# Patient Record
Sex: Female | Born: 1960 | Race: White | Hispanic: Yes | Marital: Married | Smoking: Never smoker
Health system: Southern US, Community
[De-identification: ages and names within clinical notes are randomized; demographics above are authoritative.]

## PROBLEM LIST (undated history)

## (undated) DIAGNOSIS — Z9189 Other specified personal risk factors, not elsewhere classified: Secondary | ICD-10-CM

## (undated) DIAGNOSIS — G43909 Migraine, unspecified, not intractable, without status migrainosus: Secondary | ICD-10-CM

## (undated) DIAGNOSIS — L709 Acne, unspecified: Secondary | ICD-10-CM

## (undated) DIAGNOSIS — R682 Dry mouth, unspecified: Secondary | ICD-10-CM

## (undated) DIAGNOSIS — N6009 Solitary cyst of unspecified breast: Secondary | ICD-10-CM

## (undated) DIAGNOSIS — H04123 Dry eye syndrome of bilateral lacrimal glands: Secondary | ICD-10-CM

## (undated) HISTORY — PX: BREAST SURGERY: SHX581

## (undated) HISTORY — PX: TONSILLECTOMY: SUR1361

## (undated) HISTORY — DX: Dry mouth, unspecified: R68.2

## (undated) HISTORY — PX: GYNECOLOGIC CRYOSURGERY: SHX857

## (undated) HISTORY — PX: APPENDECTOMY: SHX54

## (undated) HISTORY — DX: Migraine, unspecified, not intractable, without status migrainosus: G43.909

## (undated) HISTORY — DX: Solitary cyst of unspecified breast: N60.09

## (undated) HISTORY — DX: Other specified personal risk factors, not elsewhere classified: Z91.89

## (undated) HISTORY — PX: BREAST EXCISIONAL BIOPSY: SUR124

## (undated) HISTORY — DX: Dry eye syndrome of bilateral lacrimal glands: H04.123

## (undated) HISTORY — PX: COLPOSCOPY: SHX161

## (undated) HISTORY — DX: Acne, unspecified: L70.9

## (undated) HISTORY — PX: OVARIAN CYST SURGERY: SHX726

---

## 1997-04-15 ENCOUNTER — Ambulatory Visit (HOSPITAL_COMMUNITY): Admission: RE | Admit: 1997-04-15 | Discharge: 1997-04-15 | Payer: Self-pay | Admitting: Obstetrics and Gynecology

## 1997-11-20 ENCOUNTER — Encounter: Payer: Self-pay | Admitting: Family Medicine

## 1997-11-20 ENCOUNTER — Ambulatory Visit (HOSPITAL_COMMUNITY): Admission: RE | Admit: 1997-11-20 | Discharge: 1997-11-20 | Payer: Self-pay | Admitting: Family Medicine

## 1998-03-03 ENCOUNTER — Other Ambulatory Visit: Admission: RE | Admit: 1998-03-03 | Discharge: 1998-03-03 | Payer: Self-pay | Admitting: Obstetrics and Gynecology

## 1998-03-17 ENCOUNTER — Other Ambulatory Visit: Admission: RE | Admit: 1998-03-17 | Discharge: 1998-03-17 | Payer: Self-pay | Admitting: Obstetrics and Gynecology

## 1998-05-07 ENCOUNTER — Other Ambulatory Visit: Admission: RE | Admit: 1998-05-07 | Discharge: 1998-05-07 | Payer: Self-pay | Admitting: Obstetrics and Gynecology

## 1998-06-16 ENCOUNTER — Ambulatory Visit (HOSPITAL_COMMUNITY): Admission: RE | Admit: 1998-06-16 | Discharge: 1998-06-16 | Payer: Self-pay | Admitting: Family Medicine

## 1998-06-16 ENCOUNTER — Encounter: Payer: Self-pay | Admitting: Family Medicine

## 1998-08-03 ENCOUNTER — Ambulatory Visit (HOSPITAL_BASED_OUTPATIENT_CLINIC_OR_DEPARTMENT_OTHER): Admission: RE | Admit: 1998-08-03 | Discharge: 1998-08-03 | Payer: Self-pay | Admitting: *Deleted

## 1999-03-22 ENCOUNTER — Encounter: Payer: Self-pay | Admitting: Gastroenterology

## 1999-03-22 ENCOUNTER — Encounter: Admission: RE | Admit: 1999-03-22 | Discharge: 1999-03-22 | Payer: Self-pay | Admitting: Gastroenterology

## 1999-04-01 ENCOUNTER — Other Ambulatory Visit: Admission: RE | Admit: 1999-04-01 | Discharge: 1999-04-01 | Payer: Self-pay | Admitting: Family Medicine

## 1999-04-13 ENCOUNTER — Encounter: Admission: RE | Admit: 1999-04-13 | Discharge: 1999-04-13 | Payer: Self-pay | Admitting: Gastroenterology

## 1999-04-13 ENCOUNTER — Encounter: Payer: Self-pay | Admitting: Gastroenterology

## 1999-04-28 ENCOUNTER — Encounter: Admission: RE | Admit: 1999-04-28 | Discharge: 1999-04-28 | Payer: Self-pay | Admitting: Gastroenterology

## 1999-04-28 ENCOUNTER — Encounter: Payer: Self-pay | Admitting: Gastroenterology

## 1999-09-02 ENCOUNTER — Encounter: Payer: Self-pay | Admitting: Family Medicine

## 1999-09-02 ENCOUNTER — Ambulatory Visit (HOSPITAL_COMMUNITY): Admission: RE | Admit: 1999-09-02 | Discharge: 1999-09-02 | Payer: Self-pay | Admitting: Family Medicine

## 2000-07-20 ENCOUNTER — Other Ambulatory Visit: Admission: RE | Admit: 2000-07-20 | Discharge: 2000-07-20 | Payer: Self-pay | Admitting: *Deleted

## 2000-08-16 ENCOUNTER — Other Ambulatory Visit: Admission: RE | Admit: 2000-08-16 | Discharge: 2000-08-16 | Payer: Self-pay | Admitting: *Deleted

## 2000-08-17 ENCOUNTER — Other Ambulatory Visit: Admission: RE | Admit: 2000-08-17 | Discharge: 2000-08-17 | Payer: Self-pay | Admitting: *Deleted

## 2000-08-17 ENCOUNTER — Encounter (INDEPENDENT_AMBULATORY_CARE_PROVIDER_SITE_OTHER): Payer: Self-pay

## 2001-01-08 ENCOUNTER — Encounter: Admission: RE | Admit: 2001-01-08 | Discharge: 2001-01-08 | Payer: Self-pay | Admitting: *Deleted

## 2001-01-08 ENCOUNTER — Encounter: Payer: Self-pay | Admitting: *Deleted

## 2001-04-25 ENCOUNTER — Other Ambulatory Visit: Admission: RE | Admit: 2001-04-25 | Discharge: 2001-04-25 | Payer: Self-pay | Admitting: *Deleted

## 2002-01-22 ENCOUNTER — Encounter: Payer: Self-pay | Admitting: Obstetrics & Gynecology

## 2002-01-22 ENCOUNTER — Ambulatory Visit (HOSPITAL_COMMUNITY): Admission: RE | Admit: 2002-01-22 | Discharge: 2002-01-22 | Payer: Self-pay | Admitting: Obstetrics & Gynecology

## 2002-06-17 ENCOUNTER — Other Ambulatory Visit: Admission: RE | Admit: 2002-06-17 | Discharge: 2002-06-17 | Payer: Self-pay | Admitting: Obstetrics & Gynecology

## 2002-11-05 ENCOUNTER — Encounter: Payer: Self-pay | Admitting: Obstetrics & Gynecology

## 2002-11-05 ENCOUNTER — Encounter: Admission: RE | Admit: 2002-11-05 | Discharge: 2002-11-05 | Payer: Self-pay | Admitting: Obstetrics & Gynecology

## 2003-04-23 ENCOUNTER — Encounter: Admission: RE | Admit: 2003-04-23 | Discharge: 2003-04-23 | Payer: Self-pay | Admitting: Family Medicine

## 2003-09-18 ENCOUNTER — Encounter: Admission: RE | Admit: 2003-09-18 | Discharge: 2003-09-18 | Payer: Self-pay | Admitting: Family Medicine

## 2004-07-01 ENCOUNTER — Encounter: Admission: RE | Admit: 2004-07-01 | Discharge: 2004-07-01 | Payer: Self-pay | Admitting: Gastroenterology

## 2006-05-11 ENCOUNTER — Ambulatory Visit: Payer: Self-pay | Admitting: Family Medicine

## 2006-05-11 LAB — CONVERTED CEMR LAB
AST: 20 units/L (ref 0–37)
Alkaline Phosphatase: 59 units/L (ref 39–117)
BUN: 11 mg/dL (ref 6–23)
Basophils Absolute: 0 10*3/uL (ref 0.0–0.1)
Basophils Relative: 0 % (ref 0.0–1.0)
CO2: 31 meq/L (ref 19–32)
Calcium: 9.5 mg/dL (ref 8.4–10.5)
Creatinine, Ser: 0.7 mg/dL (ref 0.4–1.2)
GFR calc non Af Amer: 96 mL/min
Lymphocytes Relative: 38.4 % (ref 12.0–46.0)
MCHC: 34.7 g/dL (ref 30.0–36.0)
Monocytes Absolute: 0.7 10*3/uL (ref 0.2–0.7)
Monocytes Relative: 11.1 % — ABNORMAL HIGH (ref 3.0–11.0)
Neutro Abs: 2.9 10*3/uL (ref 1.4–7.7)
Neutrophils Relative %: 46.3 % (ref 43.0–77.0)
Platelets: 254 10*3/uL (ref 150–400)
Potassium: 4.8 meq/L (ref 3.5–5.1)
RDW: 12.7 % (ref 11.5–14.6)
TSH: 1.21 microintl units/mL (ref 0.35–5.50)
Total CHOL/HDL Ratio: 3.8
Triglycerides: 105 mg/dL (ref 0–149)

## 2006-05-18 ENCOUNTER — Ambulatory Visit: Payer: Self-pay | Admitting: Family Medicine

## 2006-05-24 ENCOUNTER — Ambulatory Visit: Payer: Self-pay | Admitting: Family Medicine

## 2006-06-14 ENCOUNTER — Ambulatory Visit: Payer: Self-pay | Admitting: Family Medicine

## 2007-01-10 ENCOUNTER — Encounter: Admission: RE | Admit: 2007-01-10 | Discharge: 2007-01-10 | Payer: Self-pay | Admitting: Obstetrics & Gynecology

## 2007-03-16 ENCOUNTER — Ambulatory Visit: Payer: Self-pay | Admitting: Family Medicine

## 2007-03-16 ENCOUNTER — Telehealth: Payer: Self-pay | Admitting: Family Medicine

## 2007-03-29 ENCOUNTER — Telehealth: Payer: Self-pay | Admitting: Family Medicine

## 2007-06-22 ENCOUNTER — Other Ambulatory Visit: Admission: RE | Admit: 2007-06-22 | Discharge: 2007-06-22 | Payer: Self-pay | Admitting: Obstetrics and Gynecology

## 2007-06-27 ENCOUNTER — Ambulatory Visit: Payer: Self-pay | Admitting: Family Medicine

## 2007-06-27 LAB — CONVERTED CEMR LAB
BUN: 9 mg/dL (ref 6–23)
CO2: 26 meq/L (ref 19–32)
Chloride: 107 meq/L (ref 96–112)
Cholesterol: 173 mg/dL (ref 0–200)
Eosinophils Absolute: 0.3 10*3/uL (ref 0.0–0.7)
Eosinophils Relative: 5.2 % — ABNORMAL HIGH (ref 0.0–5.0)
HCT: 39.5 % (ref 36.0–46.0)
HDL: 36.6 mg/dL — ABNORMAL LOW (ref >39.0)
Lymphocytes Relative: 29.2 % (ref 12.0–46.0)
MCHC: 34.4 g/dL (ref 30.0–36.0)
Monocytes Absolute: 0.8 10*3/uL (ref 0.1–1.0)
Potassium: 4.1 meq/L (ref 3.5–5.1)
RDW: 12.5 % (ref 11.5–14.6)
Sodium: 139 meq/L (ref 135–145)
Total CHOL/HDL Ratio: 4.7
Total Protein: 6.8 g/dL (ref 6.0–8.3)
Triglycerides: 98 mg/dL (ref 0–149)

## 2007-07-05 ENCOUNTER — Ambulatory Visit: Payer: Self-pay | Admitting: Family Medicine

## 2007-07-05 DIAGNOSIS — N951 Menopausal and female climacteric states: Secondary | ICD-10-CM

## 2007-07-05 DIAGNOSIS — N63 Unspecified lump in unspecified breast: Secondary | ICD-10-CM

## 2007-07-05 DIAGNOSIS — G43909 Migraine, unspecified, not intractable, without status migrainosus: Secondary | ICD-10-CM

## 2007-07-09 ENCOUNTER — Telehealth: Payer: Self-pay | Admitting: Family Medicine

## 2007-07-16 ENCOUNTER — Telehealth: Payer: Self-pay | Admitting: Family Medicine

## 2007-07-17 ENCOUNTER — Ambulatory Visit: Payer: Self-pay | Admitting: Family Medicine

## 2007-07-17 DIAGNOSIS — R198 Other specified symptoms and signs involving the digestive system and abdomen: Secondary | ICD-10-CM

## 2007-07-21 DIAGNOSIS — M1389 Other specified arthritis, multiple sites: Secondary | ICD-10-CM | POA: Insufficient documentation

## 2007-07-22 ENCOUNTER — Telehealth: Payer: Self-pay | Admitting: Internal Medicine

## 2007-07-24 ENCOUNTER — Ambulatory Visit: Payer: Self-pay | Admitting: Family Medicine

## 2007-07-25 ENCOUNTER — Telehealth: Payer: Self-pay | Admitting: Family Medicine

## 2007-08-22 ENCOUNTER — Telehealth: Payer: Self-pay | Admitting: Family Medicine

## 2007-11-04 DIAGNOSIS — J069 Acute upper respiratory infection, unspecified: Secondary | ICD-10-CM | POA: Insufficient documentation

## 2007-11-06 ENCOUNTER — Ambulatory Visit: Payer: Self-pay | Admitting: Family Medicine

## 2008-02-04 ENCOUNTER — Encounter: Admission: RE | Admit: 2008-02-04 | Discharge: 2008-02-04 | Payer: Self-pay | Admitting: Family Medicine

## 2008-02-06 ENCOUNTER — Telehealth: Payer: Self-pay | Admitting: *Deleted

## 2008-02-06 ENCOUNTER — Ambulatory Visit: Payer: Self-pay | Admitting: Family Medicine

## 2008-02-06 DIAGNOSIS — J019 Acute sinusitis, unspecified: Secondary | ICD-10-CM

## 2008-03-04 ENCOUNTER — Telehealth: Payer: Self-pay | Admitting: *Deleted

## 2008-04-04 ENCOUNTER — Ambulatory Visit: Payer: Self-pay | Admitting: Internal Medicine

## 2008-06-10 ENCOUNTER — Encounter: Payer: Self-pay | Admitting: Family Medicine

## 2008-06-10 ENCOUNTER — Ambulatory Visit: Payer: Self-pay | Admitting: Family Medicine

## 2008-06-26 ENCOUNTER — Ambulatory Visit: Payer: Self-pay | Admitting: Family Medicine

## 2008-06-26 LAB — CONVERTED CEMR LAB
ALT: 18 units/L (ref 0–35)
Basophils Relative: 0 % (ref 0.0–3.0)
Bilirubin Urine: NEGATIVE
Bilirubin, Direct: 0.1 mg/dL (ref 0.0–0.3)
CO2: 28 meq/L (ref 19–32)
Calcium: 8.6 mg/dL (ref 8.4–10.5)
Creatinine, Ser: 0.8 mg/dL (ref 0.4–1.2)
GFR calc non Af Amer: 81.65 mL/min (ref >60)
HCT: 37.3 % (ref 36.0–46.0)
HDL: 42.4 mg/dL (ref >39.00)
Hemoglobin: 13 g/dL (ref 12.0–15.0)
MCV: 93.9 fL (ref 78.0–100.0)
Monocytes Absolute: 0.7 10*3/uL (ref 0.1–1.0)
Monocytes Relative: 11.1 % (ref 3.0–12.0)
Neutrophils Relative %: 51.6 % (ref 43.0–77.0)
Nitrite: NEGATIVE
Platelets: 204 10*3/uL (ref 150.0–400.0)
Potassium: 4.1 meq/L (ref 3.5–5.1)
Protein, U semiquant: NEGATIVE
RBC: 3.97 M/uL (ref 3.87–5.11)
Sodium: 139 meq/L (ref 135–145)
Urobilinogen, UA: 0.2
WBC Urine, dipstick: NEGATIVE
WBC: 6.1 10*3/uL (ref 4.5–10.5)
pH: 5.5

## 2008-07-15 ENCOUNTER — Ambulatory Visit: Payer: Self-pay | Admitting: Family Medicine

## 2008-07-16 ENCOUNTER — Other Ambulatory Visit: Admission: RE | Admit: 2008-07-16 | Discharge: 2008-07-16 | Payer: Self-pay | Admitting: Obstetrics and Gynecology

## 2008-07-16 ENCOUNTER — Encounter: Payer: Self-pay | Admitting: Obstetrics and Gynecology

## 2008-07-16 ENCOUNTER — Ambulatory Visit: Payer: Self-pay | Admitting: Obstetrics and Gynecology

## 2008-07-21 ENCOUNTER — Telehealth: Payer: Self-pay | Admitting: Family Medicine

## 2008-12-03 ENCOUNTER — Ambulatory Visit: Payer: Self-pay | Admitting: Gynecology

## 2008-12-10 DIAGNOSIS — M542 Cervicalgia: Secondary | ICD-10-CM

## 2008-12-17 ENCOUNTER — Ambulatory Visit: Payer: Self-pay | Admitting: Family Medicine

## 2008-12-17 ENCOUNTER — Telehealth: Payer: Self-pay | Admitting: Family Medicine

## 2008-12-17 DIAGNOSIS — R1011 Right upper quadrant pain: Secondary | ICD-10-CM | POA: Insufficient documentation

## 2008-12-17 DIAGNOSIS — M25519 Pain in unspecified shoulder: Secondary | ICD-10-CM

## 2008-12-17 DIAGNOSIS — Z8719 Personal history of other diseases of the digestive system: Secondary | ICD-10-CM

## 2008-12-18 ENCOUNTER — Telehealth: Payer: Self-pay | Admitting: Family Medicine

## 2008-12-18 ENCOUNTER — Encounter: Admission: RE | Admit: 2008-12-18 | Discharge: 2008-12-18 | Payer: Self-pay | Admitting: Family Medicine

## 2008-12-22 ENCOUNTER — Telehealth: Payer: Self-pay | Admitting: Family Medicine

## 2008-12-23 ENCOUNTER — Encounter: Admission: RE | Admit: 2008-12-23 | Discharge: 2008-12-23 | Payer: Self-pay | Admitting: Family Medicine

## 2009-06-26 ENCOUNTER — Ambulatory Visit: Payer: Self-pay | Admitting: Family Medicine

## 2009-06-26 DIAGNOSIS — M545 Low back pain, unspecified: Secondary | ICD-10-CM | POA: Insufficient documentation

## 2009-06-26 DIAGNOSIS — D179 Benign lipomatous neoplasm, unspecified: Secondary | ICD-10-CM | POA: Insufficient documentation

## 2009-06-30 ENCOUNTER — Ambulatory Visit: Payer: Self-pay | Admitting: Family Medicine

## 2009-06-30 LAB — CONVERTED CEMR LAB
ALT: 17 units/L (ref 0–35)
AST: 23 units/L (ref 0–37)
Alkaline Phosphatase: 39 units/L (ref 39–117)
Bilirubin, Direct: 0.1 mg/dL (ref 0.0–0.3)
Blood in Urine, dipstick: NEGATIVE
Calcium: 8.9 mg/dL (ref 8.4–10.5)
Cholesterol: 192 mg/dL (ref 0–200)
Eosinophils Absolute: 0.3 10*3/uL (ref 0.0–0.7)
Eosinophils Relative: 5.7 % — ABNORMAL HIGH (ref 0.0–5.0)
GFR calc non Af Amer: 101.5 mL/min (ref >60)
Glucose, Bld: 91 mg/dL (ref 70–99)
Hemoglobin: 13 g/dL (ref 12.0–15.0)
Ketones, urine, test strip: NEGATIVE
Lymphocytes Relative: 34.3 % (ref 12.0–46.0)
MCHC: 34.5 g/dL (ref 30.0–36.0)
MCV: 95 fL (ref 78.0–100.0)
Monocytes Absolute: 0.6 10*3/uL (ref 0.1–1.0)
Neutrophils Relative %: 48 % (ref 43.0–77.0)
Potassium: 3.9 meq/L (ref 3.5–5.1)
TSH: 0.76 microintl units/mL (ref 0.35–5.50)
Total Bilirubin: 0.8 mg/dL (ref 0.3–1.2)
VLDL: 19 mg/dL (ref 0.0–40.0)
pH: 8.5

## 2009-07-16 ENCOUNTER — Ambulatory Visit: Payer: Self-pay | Admitting: Family Medicine

## 2009-07-17 ENCOUNTER — Other Ambulatory Visit: Admission: RE | Admit: 2009-07-17 | Discharge: 2009-07-17 | Payer: Self-pay | Admitting: Obstetrics and Gynecology

## 2009-07-17 ENCOUNTER — Ambulatory Visit: Payer: Self-pay | Admitting: Obstetrics and Gynecology

## 2009-08-18 ENCOUNTER — Ambulatory Visit: Payer: Self-pay | Admitting: Obstetrics and Gynecology

## 2009-09-07 ENCOUNTER — Ambulatory Visit: Payer: Self-pay | Admitting: Family Medicine

## 2009-09-07 DIAGNOSIS — J309 Allergic rhinitis, unspecified: Secondary | ICD-10-CM

## 2009-09-10 ENCOUNTER — Encounter: Admission: RE | Admit: 2009-09-10 | Discharge: 2009-09-10 | Payer: Self-pay | Admitting: Family Medicine

## 2009-11-19 ENCOUNTER — Ambulatory Visit: Payer: Self-pay | Admitting: Family Medicine

## 2009-11-24 ENCOUNTER — Telehealth: Payer: Self-pay | Admitting: Family Medicine

## 2009-11-26 ENCOUNTER — Ambulatory Visit: Payer: Self-pay | Admitting: Family Medicine

## 2009-11-27 ENCOUNTER — Telehealth: Payer: Self-pay | Admitting: Family Medicine

## 2009-11-27 ENCOUNTER — Ambulatory Visit: Payer: Self-pay | Admitting: Family Medicine

## 2009-11-30 ENCOUNTER — Ambulatory Visit: Payer: Self-pay | Admitting: Family Medicine

## 2010-01-15 ENCOUNTER — Ambulatory Visit: Payer: Self-pay | Admitting: Gynecology

## 2010-02-15 ENCOUNTER — Ambulatory Visit
Admission: RE | Admit: 2010-02-15 | Discharge: 2010-02-15 | Payer: Self-pay | Source: Home / Self Care | Attending: Obstetrics and Gynecology | Admitting: Obstetrics and Gynecology

## 2010-03-01 ENCOUNTER — Other Ambulatory Visit: Payer: Self-pay | Admitting: Obstetrics and Gynecology

## 2010-03-01 ENCOUNTER — Ambulatory Visit
Admission: RE | Admit: 2010-03-01 | Discharge: 2010-03-01 | Payer: Self-pay | Source: Home / Self Care | Attending: Obstetrics and Gynecology | Admitting: Obstetrics and Gynecology

## 2010-03-01 DIAGNOSIS — N6320 Unspecified lump in the left breast, unspecified quadrant: Secondary | ICD-10-CM

## 2010-03-02 NOTE — Progress Notes (Signed)
Summary: Call A Nurse   Call-A-Nurse Triage Call Report Triage Record Num: [id number] Operator: Frederico Hamman Patient Name: Regina Mendez Call Date & Time: 11/26/2009 6:07:28PM Patient Phone: [phone] PCP: Tinnie Gens A. Todd Patient Gender: Female PCP Fax : [phone] Patient DOB: 1960/12/12 Practice Name: [facility] Reason for Call: [name] states she was seen by Dr Tawanna Cooler 10/27 and ordered a nebulizer and albuterol. Nebulizer delivered by a Home [facility].Needs prescription called in to [facility] for Albuterol. Dr. Oliver Barre notified. Verbal order given for Albuterol .083% -usual strength-use as directed. Dispense 1 box with no refills. Called in to [facility]-[phone]. Care advice given. Protocol(s) Used: Medication Question Calls, No Triage (Adults) Recommended Outcome per Protocol: Call Darshan Solanki Immediately Reason for Outcome: [1] Urgent new prescription or refill (likelihood of harm to patient if not taken) AND [2] triager unable to fill per unit policy Care Advice:  ~ 10/

## 2010-03-02 NOTE — Assessment & Plan Note (Signed)
Summary: ST/NJR   Vital Signs:  Patient profile:   50 year old female Temp:     98.4 degrees F oral BP sitting:   90 / 64  (left arm) Cuff size:   regular  Vitals Entered By: Kern Reap CMA Duncan Dull) (September 07, 2009 4:14 PM) CC: sore throat, left breast cysts Is Patient Diabetic? No   CC:  sore throat and left breast cysts.  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  Also she's had a history of cystic lesions in both breasts.  However, the one in her left breast is more tender.  She noticed this about a week after they increased her estrogen from .0375 to .5.  This was done by her GYN.  Allergies: 1)  ! Codeine 2)  ! Sulfa 3)  ! * Ciprofloxacin  Past History:  Past medical, surgical, family and social histories (including risk factors) reviewed for relevance to current acute and chronic problems.  Past Medical History: Reviewed history from 07/05/2007 and no changes required. Acne MHA PMS childbirth plans to 50 years of age appendectomy right breast biopsy premature menopause  Past Surgical History: Reviewed history from 09/20/2006 and no changes required. CB x2 Breast Biopsy Appendectomy Colonoscopy Ovarian Cysts Tonsillectomy  Family History: Reviewed history from 09/20/2006 and no changes required. Family History of Arthritis Family History of CAD Female 1st degree relative <60 Family History Diabetes 1st degree relative Family History High cholesterol Family History Hypertension Family History Thyroid disease  Social History: Reviewed history from 09/20/2006 and no changes required. Occupation: professor Married Never Smoked Alcohol use-yes Drug use-no Regular exercise-no  Review of Systems      See HPI  Physical Exam  General:  Well-developed,well-nourished,in no acute distress; alert,appropriate and cooperative throughout  examination Head:  Normocephalic and atraumatic without obvious abnormalities. No apparent alopecia or balding. Eyes:  No corneal or conjunctival inflammation noted. EOMI. Perrla. Funduscopic exam benign, without hemorrhages, exudates or papilledema. Vision grossly normal. Ears:  External ear exam shows no significant lesions or deformities.  Otoscopic examination reveals clear canals, tympanic membranes are intact bilaterally without bulging, retraction, inflammation or discharge. Hearing is grossly normal bilaterally. Nose:  External nasal examination shows no deformity or inflammation. Nasal mucosa are pink and moist without lesions or exudates. Mouth:  Oral mucosa and oropharynx without lesions or exudates.  Teeth in good repair. Neck:  No deformities, masses, or tenderness noted. Chest Wall:  No deformities, masses, or tenderness noted. Breasts:  there is a Robin egg -sized lesion in her right breast at 3 o'clock image from the nipple left breast shows a large lesion at the 3 o'clock position about 2 inches.  The nipple.  The size of a handball also medial to that.  There is a Robin egg size lesion also. Lungs:  Normal respiratory effort, chest expands symmetrically. Lungs are clear to auscultation, no crackles or wheezes.   Impression & Recommendations:  Problem # 1:  LUMP OR MASS IN BREAST (ICD-611.72) Assessment Deteriorated  Problem # 2:  ALLERGIC ARTHRITIS, MULTIPLE SITES (ICD-716.29) Assessment: Deteriorated  Complete Medication List: 1)  Adult Aspirin Low Strength 81 Mg Tbdp (Aspirin) 2)  Ambien 5 Mg Tabs (Zolpidem tartrate) .... One tab at bedtime as needed 3)  Estrogen Patch 0.5  .... Apply once weekly 4)  Prometrium 200 Mg Caps (Progesterone micronized) .... Take one tab the first 12 days of the  month 5)  Xanax 0.5 Mg Tabs (Alprazolam) .... One by mouth 1 hour before flying. 6)  Clindamycin Phosphate 1 % Soln (Clindamycin phosphate) .... Apply once daily to affected  area  Other Orders: Radiology Referral (Radiology)  Patient Instructions: 1)  we will call and get to set up for an ultrasound of the left breast. 2)  Also take 10 mg of plain Zyrtec at bedtime for the congestion, postnasal drip, and sore throat. Prescriptions: CLINDAMYCIN PHOSPHATE 1 % SOLN (CLINDAMYCIN PHOSPHATE) apply once daily to affected area  #60 cc x 11   Entered and Authorized by:   Roderick Pee MD   Signed by:   Roderick Pee MD on 09/07/2009   Method used:   Print then Give to Patient   RxID:   [id number]

## 2010-03-02 NOTE — Assessment & Plan Note (Signed)
Summary: chest congestion/cjr/pt rsc/cjr   Vital Signs:  Patient profile:   50 year old female Weight:      126 pounds Temp:     98.8 degrees F oral BP sitting:   98 / 72  (left arm) Cuff size:   regular  Vitals Entered By: Kern Reap CMA Duncan Dull) (November 26, 2009 12:30 PM) CC: follow-up visit   CC:  follow-up visit.  History of Present Illness: Regina Mendez is a 50 year old, married female, nonsmoker, who comes in today for follow-up of asthma.  We saw her on October the 20th at that time.  She had a viral infection with some mild wheezing.  We put her on prednisone two tabs x 3 days with a taper lots of liquids and cough syrup.  She stopped the prednisone because it made her hyper.  Over the last 48 hours.  The wheezing is gotten worse.  She took 2 teaspoons of her daughters, albuterol elixir last night.  She's had no fever, sputum production, et Karie Soda.  Allergies: 1)  ! Codeine 2)  ! Sulfa 3)  ! * Ciprofloxacin  Past History:  Past medical, surgical, family and social histories (including risk factors) reviewed for relevance to current acute and chronic problems.  Past Medical History: Reviewed history from 07/05/2007 and no changes required. Acne MHA PMS childbirth plans to 50 years of age appendectomy right breast biopsy premature menopause  Past Surgical History: Reviewed history from 09/20/2006 and no changes required. CB x2 Breast Biopsy Appendectomy Colonoscopy Ovarian Cysts Tonsillectomy  Family History: Reviewed history from 09/20/2006 and no changes required. Family History of Arthritis Family History of CAD Female 1st degree relative <60 Family History Diabetes 1st degree relative Family History High cholesterol Family History Hypertension Family History Thyroid disease  Social History: Reviewed history from 09/20/2006 and no changes required. 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 No apparent alopecia or balding. Eyes:  No corneal or conjunctival inflammation noted. EOMI. Perrla. Funduscopic exam benign, without hemorrhages, exudates or papilledema. Vision grossly normal. Ears:  External ear exam shows no significant lesions or deformities.  Otoscopic examination reveals clear canals, tympanic membranes are intact bilaterally without bulging, retraction, inflammation or discharge. Hearing is grossly normal bilaterally. Nose:  External nasal examination shows no deformity or inflammation. Nasal mucosa are pink and moist without lesions or exudates. Mouth:  Oral mucosa and oropharynx without lesions or exudates.  Teeth in good repair. Neck:  No deformities, masses, or tenderness noted. Lungs:  inspiratory and expiratory wheezing   Problems:  Medical Problems Added: 1)  Dx of Asthma  (ICD-493.90)  Impression & Recommendations:  Problem # 1:  ASTHMA (ICD-493.90) Assessment Deteriorated  Her updated medication list for this problem includes:    Prednisone 20 Mg Tabs (Prednisone) ..... Uad    Qvar 40 Mcg/act Aers (Beclomethasone dipropionate) .Marland Kitchen... Take 2 puffs two times a day    Albuterol Sulfate (2.5 Mg/64ml) 0.083% Nebu (Albuterol sulfate) ..... Use every 6 hours as needed  Her updated medication list for this problem includes:    Prednisone 20 Mg Tabs (Prednisone) ..... Uad    Qvar 40 Mcg/act Aers (Beclomethasone dipropionate) .Marland Kitchen... Take 2 puffs two times a day    Albuterol Sulfate (2.5 Mg/1ml) 0.083% Nebu (Albuterol sulfate) ..... Use every 6  hours as needed  Orders: Nebulizer Tx (95621)  Complete Medication List: 1)  Adult Aspirin Low Strength 81 Mg Tbdp (Aspirin) 2)  Ambien 5 Mg Tabs (Zolpidem tartrate) ....  One tab at bedtime as needed 3)  Estrogen Patch 0.5  .... Apply once weekly 4)  Prometrium 200 Mg Caps (Progesterone micronized) .... Take one tab the first 12 days of the month 5)  Xanax 0.5 Mg Tabs (Alprazolam) .... One by mouth 1 hour before flying. 6)  Clindamycin Phosphate 1 % Soln (Clindamycin phosphate) .... Apply once daily to affected area 7)  Prednisone 20 Mg Tabs (Prednisone) .... Uad 8)  Hydromet 5-1.5 Mg/6ml Syrp (Hydrocodone-homatropine) .... 1/2 to 1 tsp three times a day as needed cough 9)  Qvar 40 Mcg/act Aers (Beclomethasone dipropionate) .... Take 2 puffs two times a day 10)  Albuterol Sulfate (2.5 Mg/39ml) 0.083% Nebu (Albuterol sulfate) .... Use every 6 hours as needed 11)  Doxycycline Hyclate 100 Mg Caps (Doxycycline hyclate) .... Take 1 tablet by mouth two times a day  Other Orders: Durable Medical Equipment (DME)  Patient Instructions: 1)  I want you to rest at home, and if  on follow-up Monday, you are  much better,we  will consider letting u  go back to work on Tuesday November the first. 2)  Drink lots of water. 3)  Prednisone two tabs daily in the morning. 4)  Qvar 2+ twice daily. 5)  The nebulizer with albuterol 4 times daily. 6)  Hydromet 0.5-teaspoon 4 times a day. 7)  Return tomorrow at 430 for follow-up. 8)  Doxycycline 100 mg b.i.d. x 10 days Prescriptions: DOXYCYCLINE HYCLATE 100 MG CAPS (DOXYCYCLINE HYCLATE) Take 1 tablet by mouth two times a day  #20 x 1   Entered and Authorized by:   Roderick Pee MD   Signed by:   Roderick Pee MD on 11/26/2009   Method used:   Electronically to        [facility]* (retail)       [address]       [place], [place]  [id number]       Ph: [phone]       Fax: [phone]   RxID:   [id number] DOXYCYCLINE HYCLATE 100 MG CAPS (DOXYCYCLINE HYCLATE) Take 1 tablet by mouth two times a day  #20 x 1   Entered and Authorized by:   Roderick Pee MD   Signed by:   Roderick Pee MD on  11/26/2009   Method used:   Print then Give to Patient   RxID:   [id number] ALBUTEROL SULFATE (2.5 MG/3ML) 0.083% NEBU (ALBUTEROL SULFATE) use every 6 hours as needed  #3 x 6   Entered by:   Kern Reap CMA (AAMA)   Authorized by:   Roderick Pee MD   Signed by:   Kern Reap CMA (AAMA) on 11/26/2009   Method used:   Print then Give to Patient   RxID:   [id number]    Orders Added: 1)  Durable Medical Equipment [DME] 2)  Est. Patient Level IV [73220] 3)  Nebulizer Tx [[id number]]

## 2010-03-02 NOTE — Assessment & Plan Note (Signed)
Summary: FLU-LIKE SXS // RS   Vital Signs:  Patient profile:   50 year old female Weight:      126 pounds Temp:     98.2 degrees F oral BP sitting:   102 / 68  (left arm) Cuff size:   regular  Vitals Entered By: Kern Reap CMA Duncan Dull) (November 19, 2009 11:18 AM) CC: body aches, HA, chest congestion   CC:  body aches, HA, and chest congestion.  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  She developed a viral syndrome over the weekend with head congestion, runny nose.  No fever.  Yesterday, she began having tightness in her chest with wheezing.  She said a history of allergic rhinitis and asthma in the past.  She did sleep well last night did not wake up short of breath  Allergies: 1)  ! Codeine 2)  ! Sulfa 3)  ! * Ciprofloxacin  Past History:  Past medical, surgical, family and social histories (including risk factors) reviewed, and no changes noted (except as noted below).  Past Medical History: Reviewed history from 07/05/2007 and no changes required. Acne MHA PMS childbirth plans to 50 years of age appendectomy right breast biopsy premature menopause  Past Surgical History: Reviewed history from 09/20/2006 and no changes required. CB x2 Breast Biopsy Appendectomy Colonoscopy Ovarian Cysts Tonsillectomy  Family History: Reviewed history from 09/20/2006 and no changes required. Family History of Arthritis Family History of CAD Female 1st degree relative <60 Family History Diabetes 1st degree relative Family History High cholesterol Family History Hypertension Family History Thyroid disease  Social History: Reviewed history from 09/20/2006 and no changes required. 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 No apparent alopecia or balding. Eyes:  No corneal or conjunctival inflammation noted. EOMI. Perrla. Funduscopic exam benign, without hemorrhages, exudates or papilledema. Vision grossly normal. Ears:  External ear exam shows no significant lesions or deformities.  Otoscopic examination reveals clear canals, tympanic membranes are intact bilaterally without bulging, retraction, inflammation or discharge. Hearing is grossly normal bilaterally. Nose:  External nasal examination shows no deformity or inflammation. Nasal mucosa are pink and moist without lesions or exudates. Mouth:  Oral mucosa and oropharynx without lesions or exudates.  Teeth in good repair. Neck:  No deformities, masses, or tenderness noted. Chest Wall:  No deformities, masses, or tenderness noted. Lungs:  symmetrical breath sounds inspiratory and XRT for her wheezing Heart:  Normal rate and regular rhythm. S1 and S2 normal without gallop, murmur, click, rub or other extra sounds.   Impression & Recommendations:  Problem # 1:  EXTRINSIC ASTHMA, UNSPECIFIED (ICD-493.00) Assessment Deteriorated  Her updated medication list for this problem includes:    Prednisone 20 Mg Tabs (Prednisone) ..... Uad  Orders: Nebulizer Tx (74259)  Complete Medication List: 1)  Adult Aspirin Low Strength 81 Mg Tbdp (Aspirin) 2)  Ambien 5 Mg Tabs (Zolpidem tartrate) .... One tab at bedtime as needed 3)  Estrogen Patch 0.5  .... Apply once weekly 4)  Prometrium 200 Mg Caps (Progesterone micronized) .... Take one tab the first 12 days of the month 5)  Xanax 0.5 Mg Tabs (Alprazolam) .... One by mouth 1 hour before  flying. 6)  Clindamycin Phosphate 1 % Soln (Clindamycin phosphate) .... Apply once daily to affected area 7)  Prednisone 20 Mg Tabs (Prednisone) .... Uad 8)   Hydromet 5-1.5 Mg/80ml Syrp (Hydrocodone-homatropine) .... 1/2 to 1 tsp three times a day as needed cough  Patient Instructions: 1)  drink 30 ounces of water daily 2)  Begin prednisone two tabs x 3 days, one x 3 days, a half x 3 days, then a half a tablet Monday, Wednesday, Friday, for a two week taper. 3)  Return on Monday for follow-up. 4)  Hydromet one half to 1 teaspoon up to 3 times a day as needed for cough/cold Prescriptions: HYDROMET 5-1.5 MG/5ML SYRP (HYDROCODONE-HOMATROPINE) 1/2 to 1 tsp three times a day as needed cough  #4oz x 1   Entered and Authorized by:   Roderick Pee MD   Signed by:   Roderick Pee MD on 11/19/2009   Method used:   Print then Give to Patient   RxID:   [id number] PREDNISONE 20 MG TABS (PREDNISONE) UAD  #30 x 1   Entered and Authorized by:   Roderick Pee MD   Signed by:   Roderick Pee MD on 11/19/2009   Method used:   Print then Give to Patient   RxID:   [id number]    Orders Added: 1)  Est. Patient Level IV [95284] 2)  Nebulizer Tx [13244]

## 2010-03-02 NOTE — Assessment & Plan Note (Signed)
Summary: ONE DAY F/U // RS   Vital Signs:  Patient profile:   50 year old female Weight:      126 pounds Temp:     98.7 degrees F BP sitting:   98 / 62  Vitals Entered By: Kern Reap CMA Duncan Dull) (November 27, 2009 4:51 PM)  CC: follow-up visit   CC:  follow-up visit.  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  She is currently on prednisone 40 mg daily, Qvar two puffs b.i.d., albuterol with nebulizer 4 times a day, doxycycline, 100 mg b.i.d.  She feels much better in the last 24 hours.  She's been resting at home.  She never got the cough syrup fill  Allergies: 1)  ! Codeine 2)  ! Sulfa 3)  ! * Ciprofloxacin  Past History:  Past medical, surgical, family and social histories (including risk factors) reviewed for relevance to current acute and chronic problems.  Past Medical History: Reviewed history from 07/05/2007 and no changes required. Acne MHA PMS childbirth plans to 50 years of age appendectomy right breast biopsy premature menopause  Past Surgical History: Reviewed history from 09/20/2006 and no changes required. CB x2 Breast Biopsy Appendectomy Colonoscopy Ovarian Cysts Tonsillectomy  Family History: Reviewed history from 09/20/2006 and no changes required. Family History of Arthritis Family History of CAD Female 1st degree relative <60 Family History Diabetes 1st degree relative Family History High cholesterol Family History Hypertension Family History Thyroid disease  Social History: Reviewed history from 09/20/2006 and no changes required. 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 Uad    Qvar 40 Mcg/act Aers (Beclomethasone dipropionate) .Marland Kitchen... Take 2 puffs two times a day    Albuterol Sulfate (2.5 Mg/83ml) 0.083% Nebu (Albuterol sulfate) ..... Use every 6 hours as needed  Complete Medication List: 1)  Adult Aspirin Low Strength 81 Mg Tbdp (Aspirin) 2)  Ambien 5 Mg Tabs (Zolpidem tartrate) .... One tab at bedtime as needed 3)  Estrogen Patch 0.5  .... Apply once weekly 4)  Prometrium 200 Mg Caps (Progesterone micronized) .... Take one tab the first 12 days of the month 5)  Xanax 0.5 Mg Tabs (Alprazolam) .... One by mouth 1 hour before flying. 6)  Clindamycin Phosphate 1 % Soln (Clindamycin phosphate) .... Apply once daily to affected area 7)  Prednisone 20 Mg Tabs (Prednisone) .... Uad 8)  Hydromet 5-1.5 Mg/7ml Syrp (Hydrocodone-homatropine) .... 1/2 to 1 tsp three times a day as needed cough 9)  Qvar 40 Mcg/act Aers (Beclomethasone dipropionate) .... Take 2 puffs two times a day 10)  Albuterol Sulfate (2.5 Mg/83ml) 0.083% Nebu (Albuterol sulfate) .... Use every 6 hours as needed 11)  Doxycycline Hyclate 100 Mg Caps (Doxycycline hyclate) .... Take 1 tablet by mouth two times a day  Patient Instructions: 1)  continue prednisone two tablets daily, Qvar two puffs twice daily, nebulizer with albuterol 4 times a day, doxycycline, 100 mg twice daily, and Hydromet, as needed for cough.  Continue to rest at home.  Return Monday afternoon for follow-up   Orders  Added: 1)  Est. Patient Level III [16109]

## 2010-03-02 NOTE — Assessment & Plan Note (Signed)
Summary: CPX/CJR   Vital Signs:  Patient profile:   50 year old female Height:      64.25 inches Weight:      128 pounds Temp:     98.6 degrees F oral BP sitting:   84 / 60  (left arm) Cuff size:   regular  Vitals Entered By: Kern Reap CMA Duncan Dull) (July 16, 2009 3:06 PM) CC: cpx   CC:  cpx.  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  And migraine headaches.  However, her migraines have been quiet.  She also has a history of fibrocystic breast disease.  She does BSE monthly and gets in her mammography.  She gets routine eye care.  Dental care.  Tetanus booster 7 years ago.  Review of systems negative except she had a lot of sun exposure as a teenager in [place].  Allergies: 1)  ! Codeine 2)  ! Sulfa 3)  ! * Ciprofloxacin  Past History:  Past medical, surgical, family and social histories (including risk factors) reviewed, and no changes noted (except as noted below).  Past Medical History: Reviewed history from 07/05/2007 and no changes required. Acne MHA PMS childbirth plans to 50 years of age appendectomy right breast biopsy premature menopause  Past Surgical History: Reviewed history from 09/20/2006 and no changes required. CB x2 Breast Biopsy Appendectomy Colonoscopy Ovarian Cysts Tonsillectomy  Family History: Reviewed history from 09/20/2006 and no changes required. Family History of Arthritis Family History of CAD Female 1st degree relative <60 Family History Diabetes 1st degree relative Family History High cholesterol Family History Hypertension Family History Thyroid disease  Social History: Reviewed history from 09/20/2006 and no changes  required. Occupation: professor Married Never Smoked Alcohol use-yes Drug use-no Regular exercise-no  Review of Systems      See HPI  Physical Exam  General:  Well-developed,well-nourished,in no acute distress; alert,appropriate and cooperative throughout examination Head:  Normocephalic and atraumatic without obvious abnormalities. No apparent alopecia or balding. Eyes:  No corneal or conjunctival inflammation noted. EOMI. Perrla. Funduscopic exam benign, without hemorrhages, exudates or papilledema. Vision grossly normal. Ears:  External ear exam shows no significant lesions or deformities.  Otoscopic examination reveals clear canals, tympanic membranes are intact bilaterally without bulging, retraction, inflammation or discharge. Hearing is grossly normal bilaterally. Nose:  External nasal examination shows no deformity or inflammation. Nasal mucosa are pink and moist without lesions or exudates. Mouth:  Oral mucosa and oropharynx without lesions or exudates.  Teeth in good repair. Neck:  No deformities, masses, or tenderness noted. Chest Wall:  No deformities, masses, or tenderness noted. Breasts:  cystic lesion, right breast at the 12 o'clock position also, multiple cystic lesions at the 3 o'clock left breast.  These have not changed.  Also, scar right breast at the 6 o'clock position from previous biopsy Lungs:  Normal respiratory effort, chest expands symmetrically. Lungs are clear to auscultation, no crackles or wheezes. Heart:  Normal rate and regular rhythm. S1 and S2 normal without gallop, murmur, click, rub or other extra sounds. Abdomen:  Bowel sounds positive,abdomen soft and non-tender without masses, organomegaly or hernias noted. Rectal:  gyn  Genitalia:  gyn  Msk:  No deformity or scoliosis noted of thoracic or lumbar spine.   Pulses:  R and L carotid,radial,femoral,dorsalis pedis and posterior tibial pulses are full and equal bilaterally Extremities:  No clubbing,  cyanosis, edema, or deformity noted with normal full range of motion of all joints.   Neurologic:  No cranial nerve deficits noted. Station and gait are normal. Plantar reflexes are down-going bilaterally. DTRs are symmetrical throughout. Sensory, motor and coordinative functions appear intact. Skin:  total body skin exam normal.  She has multiple freckles and moles.  All of which appear to be benign Cervical Nodes:  No lymphadenopathy noted Axillary Nodes:  No palpable lymphadenopathy Inguinal Nodes:  No significant adenopathy Psych:  Cognition and judgment appear intact. Alert and cooperative with normal attention span and concentration. No apparent delusions, illusions, hallucinations   Impression & Recommendations:  Problem # 1:  ROUTINE GENERAL MEDICAL EXAM@HEALTH  CARE FACL (ICD-V70.0) Assessment Unchanged  Orders: EKG w/ Interpretation (93000)  Problem # 2:  LUMP OR MASS IN BREAST (ICD-611.72) Assessment: Unchanged  Problem # 3:  MIGRAINE HEADACHE (ICD-346.90) Assessment: Improved  The following medications were removed from the medication list:    Vicodin Es 7.5-750 Mg Tabs (Hydrocodone-acetaminophen) .Marland Kitchen... 1 tab @ bedtime Her updated medication list for this problem includes:    Adult Aspirin Low Strength 81 Mg Tbdp (Aspirin)  Complete Medication List: 1)  Adult Aspirin Low Strength 81 Mg Tbdp (Aspirin) 2)  Ambien 5 Mg Tabs (Zolpidem tartrate) .... One tab at bedtime as needed 3)  Estrogen Patch .94  .... Apply once weekly 4)  Prometrium 200 Mg Caps (Progesterone micronized) .... Take one tab the first 12 days of the month 5)  Xanax 0.5 Mg Tabs (Alprazolam) .... One by mouth 1 hour before flying.  Patient Instructions: 1)  Please schedule a follow-up appointment in 1 year. 2)  It is important that you exercise regularly at least 20 minutes 5 times a week. If you develop chest pain, have severe difficulty breathing, or feel very tired , stop exercising immediately and  seek medical attention. 3)  Schedule your mammogram. 4)  Schedule a colonoscopy/sigmoidoscopy to help detect colon cancer. 5)  Take calcium +Vitamin D daily. 6)  Take an Aspirin every day. Prescriptions: XANAX 0.5 MG TABS (ALPRAZOLAM) one by mouth 1 hour before flying.  #10 x 1   Entered and Authorized by:   Roderick Pee MD   Signed by:   Roderick Pee MD on 07/16/2009   Method used:   Print then Give to Patient   RxID:   [id number] AMBIEN 5 MG TABS (ZOLPIDEM TARTRATE) one tab at bedtime as needed  #30 x 5   Entered and Authorized by:   Roderick Pee MD   Signed by:   Roderick Pee MD on 07/16/2009   Method used:   Print then Give to Patient   RxID:   [id number]

## 2010-03-02 NOTE — Assessment & Plan Note (Signed)
Summary: F/U PER DR TODD // RS   Vital Signs:  Patient profile:   50 year old female Weight:      126 pounds Temp:     98.1 degrees F oral BP sitting:   98 / 64  (left arm) Cuff size:   regular  Vitals Entered By: Kern Reap CMA Duncan Dull) (November 30, 2009 11:12 AM) CC: follow-up visit   CC:  follow-up visit.  History of Present Illness: Regina Mendez is a 50 year old, married female, nonsmoker, Ph.D. Runner, broadcasting/film/video, who comes in today for follow-up of asthma.  We kept her home.  The past 5 days because she developed severe asthma.  She states today she feels a whole lot better.  No side effects from medication  Allergies: 1)  ! Codeine 2)  ! Sulfa 3)  ! * Ciprofloxacin  Past History:  Past medical, surgical, family and social histories (including risk factors) reviewed for relevance to current acute and chronic problems.  Past Medical History: Reviewed history from 07/05/2007 and no changes required. Acne MHA PMS childbirth plans to 50 years of age appendectomy right breast biopsy premature menopause  Past Surgical History: Reviewed history from 09/20/2006 and no changes required. CB x2 Breast Biopsy Appendectomy Colonoscopy Ovarian Cysts Tonsillectomy  Family History: Reviewed history from 09/20/2006 and no changes required. Family History of Arthritis Family History of CAD Female 1st degree relative <60 Family History Diabetes 1st degree relative Family History High cholesterol Family History Hypertension Family History Thyroid disease  Social History: Reviewed history from 09/20/2006 and no changes required. Occupation: professor Married Never Smoked Alcohol use-yes Drug use-no Regular exercise-no  Review of Systems      See HPI  Physical Exam  General:  Well-developed,well-nourished,in no acute distress; alert,appropriate and cooperative throughout examination Lungs:  inspiratory and expiratory wheezing still present, although markedly  diminished   Impression & Recommendations:  Problem # 1:  ASTHMA (ICD-493.90) Assessment Improved  Her updated medication list for this problem includes:    Prednisone 20 Mg Tabs (Prednisone) ..... Uad    Qvar 40 Mcg/act Aers (Beclomethasone dipropionate) .Marland Kitchen... Take 2 puffs two times a day    Albuterol Sulfate (2.5 Mg/74ml) 0.083% Nebu (Albuterol sulfate) ..... Use every 6 hours as needed  Complete Medication List: 1)  Adult Aspirin Low Strength 81 Mg Tbdp (Aspirin) 2)  Ambien 5 Mg Tabs (Zolpidem tartrate) .... One tab at bedtime as needed 3)  Estrogen Patch 0.5  .... Apply once weekly 4)  Prometrium 200 Mg Caps (Progesterone micronized) .... Take one tab the first 12 days of the month 5)  Xanax 0.5 Mg Tabs (Alprazolam) .... One by mouth 1 hour before flying. 6)  Clindamycin Phosphate 1 % Soln (Clindamycin phosphate) .... Apply once daily to affected area 7)  Prednisone 20 Mg Tabs (Prednisone) .... Uad 8)  Hydromet 5-1.5 Mg/29ml Syrp (Hydrocodone-homatropine) .... 1/2 to 1 tsp three times a day as needed cough 9)  Qvar 40 Mcg/act Aers (Beclomethasone dipropionate) .... Take 2 puffs two times a day 10)  Albuterol Sulfate (2.5 Mg/85ml) 0.083% Nebu (Albuterol sulfate) .... Use every 6 hours as needed 11)  Doxycycline Hyclate 100 Mg Caps (Doxycycline hyclate) .... Take 1 tablet by mouth two times a day  Patient Instructions: 1)  take to prednisone tablets today, Tuesday, and Wednesday, then take a tablet, half a day for 3 days, one for 3 days, a half for 3 days, then half a tablet Monday, Wednesday, Friday, for a two week taper. 2)  Use the  nebulizer twice daily.x 1 week, then stop 3)  Continue the Qvar two puffs twice daily and finish the  atb,,,,,,,,,,, return in 3 weeks for follow-up   Orders Added: 1)  Est. Patient Level III [81191]

## 2010-03-02 NOTE — Progress Notes (Signed)
Summary: fyi  Phone Note Call from Patient   Caller: Patient Call For: Roderick Pee MD Summary of Call: Pt did not get Prednisone filled until yesterday, and could not sleep last night.  Is asking permission from Dr. Tawanna Cooler to stop. [phone] Initial call taken by: Lynann Beaver CMA AAMA,  November 24, 2009 9:40 AM  Follow-up for Phone Call        ok.............Marland Kitchenwould recommend she begin Qvar 42 puffs b.i.d. because of the asthma x 2 weeks.  Return in two weeks for follow-up, sooner if any problems.  Okay to call in one Qvar 40 Follow-up by: Roderick Pee MD,  November 24, 2009 9:43 AM  Additional Follow-up for Phone Call Additional follow up Details #1::        left message on machine for patient to return our call Additional Follow-up by: Kern Reap CMA Duncan Dull),  November 24, 2009 2:50 PM    Additional Follow-up for Phone Call Additional follow up Details #2::    patient starting not feeling well again so she started her prednisone again. Follow-up by: Kern Reap CMA Duncan Dull),  November 24, 2009 4:48 PM  Additional Follow-up for Phone Call Additional follow up Details #3:: Details for Additional Follow-up Action Taken: let's switch her to the inhaled steroid Qvar 40..........2 puffs  b.i.d., which will not cause sleep dysfunction.  Comment one with a refill Additional Follow-up by: Roderick Pee MD,  November 24, 2009 5:08 PM  New/Updated Medications: QVAR 40 MCG/ACT AERS (BECLOMETHASONE DIPROPIONATE) take 2 puffs two times a day Prescriptions: QVAR 40 MCG/ACT AERS (BECLOMETHASONE DIPROPIONATE) take 2 puffs two times a day  #2 x 1   Entered by:   Kern Reap CMA (AAMA)   Authorized by:   Roderick Pee MD   Signed by:   Kern Reap CMA (AAMA) on 11/24/2009   Method used:   Electronically to        [facility]* (retail)       [address]       [place], [place]  [id number]       Ph: [phone]       Fax: [phone]   RxID:   [id number]

## 2010-03-02 NOTE — Assessment & Plan Note (Signed)
Summary: BACK PAIN // RS   Vital Signs:  Patient profile:   50 year old female Height:      64.25 inches Weight:      129 pounds BMI:     22.05 Temp:     98.6 degrees F oral BP sitting:   98 / 68  (left arm) Cuff size:   regular  Vitals Entered By: Kern Reap CMA Duncan Dull) (Jun 26, 2009 11:57 AM) CC: back pain   CC:  back pain.  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  For the past two, weeks he's had right and left lumbar back pain.  It's constant it's dull sometimes sharp.  It does not radiate.  She states the pain is a 5 on a scale of one to 10.  No neurologic symptoms.  No history of trauma.  She has been doing some new exercise at the yoga class.  Her last episode of back pain, like this was 15 years ago.  Review of systems negative.  Also, she has a lump in her right upper quadrant of her abdomen.  It's been there for a long time and has not grown.  She also has a very large cyst in her left breast.  She is on hormone replacement therapy via GYN.  Her mammogram in December showed no significant change.  She was advised to continue BSE monthly  Allergies: 1)  ! Codeine 2)  ! Sulfa 3)  ! * Ciprofloxacin  Social History: Reviewed history from 09/20/2006 and no changes required. 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 Abdomen:  lipoma marble-sized right upper quadrant Msk:  No deformity or scoliosis noted of thoracic or lumbar spine.   Pulses:  R and L carotid,radial,femoral,dorsalis pedis and posterior tibial pulses are full and equal bilaterally Extremities:  No clubbing, cyanosis, edema, or  deformity noted with normal full range of motion of all joints.   Neurologic:  No cranial nerve deficits noted. Station and gait are normal. Plantar reflexes are down-going bilaterally. DTRs are symmetrical throughout. Sensory, motor and coordinative functions appear intact.   Impression & Recommendations:  Problem # 1:  LIPOMA (ICD-214.9) Assessment New  Problem # 2:  LOW BACK PAIN, ACUTE (ICD-724.2) Assessment: New  Her updated medication list for this problem includes:    Adult Aspirin Low Strength 81 Mg Tbdp (Aspirin)    Flexeril 5 Mg Tabs (Cyclobenzaprine hcl) .Marland Kitchen... 1 tab @ bedtime    Vicodin Es 7.5-750 Mg Tabs (Hydrocodone-acetaminophen) .Marland Kitchen... 1 tab @ bedtime  Complete Medication List: 1)  Adult Aspirin Low Strength 81 Mg Tbdp (Aspirin) 2)  Ambien 5 Mg Tabs (Zolpidem tartrate) .... One tab at bedtime as needed 3)  Estrogen Patch .54  .... Apply once weekly 4)  Prometrium 200 Mg Caps (Progesterone micronized) .... Take one tab the first 12 days of the month 5)  Xanax 0.5 Mg Tabs (Alprazolam) .... One by mouth 1 hour before flying. 6)  Flexeril 5 Mg Tabs (Cyclobenzaprine hcl) .Marland Kitchen.. 1 tab @ bedtime 7)  Vicodin Es 7.5-750 Mg Tabs (Hydrocodone-acetaminophen) .Marland Kitchen.. 1 tab @ bedtime  Patient Instructions: 1)  take Motrin, 600 mg twice a day with food.  D0 the . yoga back stretching exercises at home twice daily. 2)  Take a half of a Flexeril, and a half of a Vicodin at bedtime as needed for pain.  If over the next 10 days to two weeks, you do not see any improvement.  Call and we will get to set up for physical therapy 3)  also use  a heating pad at night remember to use low heat and cover the heating pad with a towel so, you don't burn, your skin Prescriptions: VICODIN ES 7.5-750 MG TABS (HYDROCODONE-ACETAMINOPHEN) 1 tab @ bedtime  #30 x 1   Entered and Authorized by:   Roderick Pee MD   Signed by:   Roderick Pee MD on 06/26/2009   Method used:   Print then Give to Patient    RxID:   [id number] FLEXERIL 5 MG TABS (CYCLOBENZAPRINE HCL) 1 tab @ bedtime  #30 x 1   Entered and Authorized by:   Roderick Pee MD   Signed by:   Roderick Pee MD on 06/26/2009   Method used:   Print then Give to Patient   RxID:   [id number]

## 2010-03-05 ENCOUNTER — Ambulatory Visit
Admission: RE | Admit: 2010-03-05 | Discharge: 2010-03-05 | Disposition: A | Discharge status: Home / Self Care | Payer: Self-pay | Source: Ambulatory Visit | Attending: Obstetrics and Gynecology | Admitting: Obstetrics and Gynecology

## 2010-03-05 ENCOUNTER — Other Ambulatory Visit: Payer: Self-pay | Admitting: Obstetrics and Gynecology

## 2010-03-05 DIAGNOSIS — N6320 Unspecified lump in the left breast, unspecified quadrant: Secondary | ICD-10-CM

## 2010-04-12 ENCOUNTER — Telehealth: Payer: Self-pay | Admitting: Family Medicine

## 2010-04-12 NOTE — Telephone Encounter (Signed)
Pt would like rachel to return her call concerning nebulizer.

## 2010-04-13 NOTE — Telephone Encounter (Signed)
Left message on machine for patient

## 2010-06-18 NOTE — Assessment & Plan Note (Signed)
[facility] OFFICE NOTE   Regina Mendez, Regina Mendez                      MRN:          [id number]  DATE:05/18/2006                            DOB:          [birth year]    Ms. 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   PAST MEDICAL HISTORY:  1. She had a benign breast biopsy.  2. She has had a colonoscopy.  3. She had her appendix removed.  At the time, she had cysts on her      ovaries which were evaluated, but they were normal.  The ovaries      were not removed.  At the time, she had incidental appendectomy.  4. She also had her tonsils removed as a child.  5. She has twins that are 79 years old.   PAST ILLNESSES:  None.   INJURIES:  None.   DRUG ALLERGIES:  NONE EXCEPT SHE IS SENSITIVE, SHE SAYS, TO SULFA.  SHE  SAYS SULFA MAKES HER FACE RED.  SHE DOES NOT GET HIVES, SHORTNESS OF  BREATH, ETC.  SHE ALSO HAS A CNS SIDE EFFECT WITH CODEINE THAT MAKES HER  SLEEPY.   She is a nonsmoker, does not drink alcohol, or any street drugs.   CURRENT MEDICATIONS:  1. Clindamycin p.r.n. for adult acne.  2. Ambien p.r.n. for sleep dysfunction.  3. She is also on HRT patches via gyn for postmenopausal symptoms.  It      is Climara Pro patches.   REVIEW OF SYSTEMS:  HEENT:  Pertinent.  She has migraine headaches.  We  discussed Zomig, but I did not give her a prescription.  She wears  glasses and gets yearly eye exams.  She has no hearing problems.  She  gets regular dental care.  CARDIOPULMONARY:  Negative.  GI:  Negative except for one time she was told she had IBS.  In 2006,  she had a colonoscopy and had polyps.  She was told to come back in  2011.  GU:  Negative.  GYN:  She has had 1 pregnancy of the twins that are 49 years old.  She  went through menopause, is  now being Dr. Seymour Bars, a gynecologist.  She  has not been seen on a regular basis, gets annual mammography.  Pap  smear was 2 months ago by gyn.  ENDO:  Negative.  Weight steady.  MUSCULOSKELETAL:  Negative.  VASCULAR:  Negative.  She does have some allergic rhinitis.  She takes  over-the-counter medications.  She also has adult acne.  She __________  .   SOCIAL HISTORY:  She is originally from [place].  She is a Ph.D. in  [employer].  She is married, and she has twin children 18  years of age.   FAMILY HISTORY:  Dad died at 8, MI, high cholesterol, coronary artery  disease, hypertension, smoker.  Mother 74, has thyroid disease,  osteoarthritis, and hyperlipidemia.  One brother has diabetes.  Two  sisters; one has hyperlipidemia, the other is in good health.  Last  tetanus shot 2007.   PHYSICAL EVALUATION:  VITAL SIGNS:  Height was 5 feet 4-3/4 inches,  weight 127.  She is concerned about her weight.  She normally weighs  122.  BP was 98/60.  Pulse was 70 and regular.  She is afebrile.  GENERAL:  She is well-developed, well-nourished female in no acute  distress.  HEENT:  Negative except she wears glasses.  NECK:  Supple.  The thyroid is not enlarged.  No carotid bruits.  CHEST:  Clear to auscultation.  CARDIAC:  Negative.  BREAST EXAM:  Negative except for scar from previous biopsy.  ABDOMEN:  Negative.  PELVIC AND RECTAL:  By Clayton Bibles, therefore deferred.  EXTREMITIES:  Normal skin, normal peripheral pulses normal.   LABORATORY DATA:  Normal CBC.  Metabolic panel except for blood sugars  slightly elevated at 103.  Liver function tests were normal as was lipid  panel, thyroid level, and UA, except she has 2+ blood in her urine.  She  is having some slight dysuria.   Healthy female, problem list as follows:  1. She wants to discuss postmenopausal symptoms and the treatment.  I      explained to her that it would be best taken up with her      gynecologist or  another gyn consult; however, my bias is not to use      the patches.  I was more in favor of the timely Prempro.  She will      think about this and discuss it with her gynecologist.  2. Migraine headaches.  We will prescribe Zomig 5 mg ZMT sublingual      p.r.n.  Prescription not given today.  3. Adult acne.  Continue Cleocin T q.h.s.  4. Urinary tract infection with hematuria.  Plan amoxicillin 250 mg      t.i.d. x10 days.  Repeat UA in 2 weeks.  5. History of colon polyps.  Continue gastrointestinal followup.  6. Irritable bowel syndrome, asymptomatic.  7. Allergic rhinitis, over-the-counter medication.  8. Status post appendectomy.  9. Status post T&A.  10.Status post right breast biopsy benign.  11.The other issue she would like to discuss is ovarian cancer and      screening which we did.   Forty-five minutes was spent with the patient going through her medical  history, physical examination, addressing our concerns.  She will come  back in 2 weeks for recheck of her UA.  At that time, we will also go  ahead and write her some Zomig for her migraines.     Jeffrey A. Tawanna Cooler, MD  Electronically Signed    JAT/MedQ  DD: 05/19/2006  DT: 05/19/2006  Job #: [id number]

## 2010-06-18 NOTE — Assessment & Plan Note (Signed)
[facility]                                 ON-CALL NOTE   TANSY, LOREK                      MRN:          [id number]  DATE:01/27/2007                            DOB:          [birth year]    PRIMARY CARE PHYSICIAN:  Dr. Tawanna Cooler.   Ms. Regina Mendez is a patient of Dr. Nelida Meuse who reports that she has been  having a sinus infection for over 10 days. While she was in [place],  she went to the pharmacy and picked up some doxycycline and has been  treating herself with 100 mg daily. She reports that she has been on it  for five days and she continues to have bloody drainage from her right  nostril. She reports that her husband had the same symptoms and was  treated by Dr. Cato Mulligan and she was wondering if she needed to adjust her  doxycycline.   I advised the patient that she should be seen by a physician since her  treatment of her symptoms with doxycycline has not been helpful. The  patient asked if she should increase the dose. I advised the patient  that I cannot recommend increasing a medication that was not prescribed  by a physician, especially since it is an antibiotic. I would prefer  that she be seen at an urgent care this evening to be further evaluated  to make sure that she has no other etiology for her symptoms. The  patient reports that she is in [place] and I advised her to look for  her nearest urgent care for further assessment and that continuing  treating with an antibiotic without knowing the true cause of her  symptoms is not appropriate.     Leanne Chang, M.D.  Electronically Signed    LA/MedQ  DD: 01/27/2007  DT: 01/28/2007  Job #: [id number]

## 2010-06-18 NOTE — Assessment & Plan Note (Signed)
[facility]                                 ON-CALL NOTE   MODINE, OPPENHEIMER                      MRN:          [id number]  DATE:07/29/2006                            DOB:          07/18/1960    PRIMARY CARE PHYSICIAN:  Kelle Darting, M.D.   PHONE NUMBER:  [phone].   TIME OF CALL:  6:59 p.m.   HISTORY OF PRESENT ILLNESS:  Ms. Westergren had a terrible case of poison  ivy last year. She got prescribed prednisone but never wound up taking  this. This year, she was working in her garden, clearing a lot of weeds  and debris. Did not think that she had poison ivy again but started  having a rash again on her arms and her knees. She was exposed on  Wednesday and it has gotten at least bothersome today. She asks whether  she should take the prednisone that she has left over. She really has  not tried anything yet for this.   PLAN:  I told her to try some over-the-counter Cortisone cream and then  if she is getting worse as the next day goes on, she probably should go  ahead with the prednisone, so that she does not get as bad off as she  was the last time.     Karie Schwalbe, MD  Electronically Signed    RIL/MedQ  DD: 07/29/2006  DT: 07/29/2006  Job #: [id number]   cc:   Tinnie Gens A. Tawanna Cooler, MD

## 2010-07-15 ENCOUNTER — Ambulatory Visit: Payer: BC Managed Care – PPO | Admitting: Obstetrics and Gynecology

## 2010-07-20 ENCOUNTER — Encounter: Payer: BC Managed Care – PPO | Admitting: Obstetrics and Gynecology

## 2010-07-21 ENCOUNTER — Ambulatory Visit: Payer: BC Managed Care – PPO | Admitting: Family Medicine

## 2010-07-29 ENCOUNTER — Other Ambulatory Visit (HOSPITAL_COMMUNITY)
Admission: RE | Admit: 2010-07-29 | Discharge: 2010-07-29 | Disposition: A | Discharge status: Home / Self Care | Payer: BC Managed Care – PPO | Source: Ambulatory Visit | Attending: Obstetrics and Gynecology | Admitting: Obstetrics and Gynecology

## 2010-07-29 ENCOUNTER — Other Ambulatory Visit: Payer: Self-pay | Admitting: Obstetrics and Gynecology

## 2010-07-29 ENCOUNTER — Encounter (INDEPENDENT_AMBULATORY_CARE_PROVIDER_SITE_OTHER): Payer: BC Managed Care – PPO | Admitting: Obstetrics and Gynecology

## 2010-07-29 DIAGNOSIS — Z124 Encounter for screening for malignant neoplasm of cervix: Secondary | ICD-10-CM | POA: Insufficient documentation

## 2010-07-29 DIAGNOSIS — Z01419 Encounter for gynecological examination (general) (routine) without abnormal findings: Secondary | ICD-10-CM

## 2010-07-30 ENCOUNTER — Other Ambulatory Visit: Payer: Self-pay | Admitting: Obstetrics and Gynecology

## 2010-07-30 DIAGNOSIS — N649 Disorder of breast, unspecified: Secondary | ICD-10-CM

## 2010-08-02 ENCOUNTER — Ambulatory Visit (INDEPENDENT_AMBULATORY_CARE_PROVIDER_SITE_OTHER): Payer: BC Managed Care – PPO | Admitting: Family Medicine

## 2010-08-02 ENCOUNTER — Encounter: Payer: Self-pay | Admitting: Family Medicine

## 2010-08-02 DIAGNOSIS — G458 Other transient cerebral ischemic attacks and related syndromes: Secondary | ICD-10-CM

## 2010-08-02 DIAGNOSIS — R21 Rash and other nonspecific skin eruption: Secondary | ICD-10-CM

## 2010-08-02 DIAGNOSIS — N63 Unspecified lump in unspecified breast: Secondary | ICD-10-CM

## 2010-08-02 LAB — POCT URINALYSIS DIPSTICK
Blood, UA: NEGATIVE
Protein, UA: NEGATIVE
Spec Grav, UA: 1.015
Urobilinogen, UA: 0.2
pH, UA: 8.5

## 2010-08-02 LAB — BASIC METABOLIC PANEL
BUN: 9 mg/dL (ref 6–23)
Calcium: 9.5 mg/dL (ref 8.4–10.5)
Creatinine, Ser: 0.8 mg/dL (ref 0.4–1.2)
GFR: 84.58 mL/min (ref >60.00)

## 2010-08-02 LAB — CBC WITH DIFFERENTIAL/PLATELET
Basophils Absolute: 0 10*3/uL (ref 0.0–0.1)
Eosinophils Relative: 4.6 % (ref 0.0–5.0)
Monocytes Absolute: 0.6 10*3/uL (ref 0.1–1.0)
Neutro Abs: 3.4 10*3/uL (ref 1.4–7.7)
Neutrophils Relative %: 56.5 % (ref 43.0–77.0)
Platelets: 236 10*3/uL (ref 150.0–400.0)
RBC: 4.1 Mil/uL (ref 3.87–5.11)
RDW: 13.9 % (ref 11.5–14.6)

## 2010-08-02 LAB — HEPATIC FUNCTION PANEL
ALT: 19 U/L (ref 0–35)
Bilirubin, Direct: 0.1 mg/dL (ref 0.0–0.3)
Total Bilirubin: 1 mg/dL (ref 0.3–1.2)

## 2010-08-02 LAB — LIPID PANEL
HDL: 49.9 mg/dL (ref >39.00)
LDL Cholesterol: 120 mg/dL — ABNORMAL HIGH (ref 0–99)
Total CHOL/HDL Ratio: 4
Triglycerides: 102 mg/dL (ref 0.0–149.0)

## 2010-08-02 NOTE — Progress Notes (Signed)
  Subjective:    Patient ID: Regina Mendez, female    DOB: 07-27-1960, 50 y.o.   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  She has been doing more upper body exercises recently and now notices that she wakes up at night with numbness in both arms.  Track to sit up shake her arms and the numbness goes away.  Review of systems otherwise negative.  She's had a skin rash on her right lower leg from working in the garden.  It was a pleuritic rash that is slowly healing.  Probable contact dermatitis.  She tells me that Dr. Auburn Bilberry. Has increased her estrogen, because she's waking up 3 or 4 times at night.  However, she is concerned about the increased estrogen and the fibrocystic changes, that she has in her breasts.  She did have a follow-up ultrasound this week   Review of Systems General dermatologic review of systems otherwise negative    Objective:   Physical Exam Well-developed and nourished, female in no acute distress.  Examination the upper extremities shows her pulses disappear within we hyperextend above 90 degrees, consistent with subclavian steal syndrome.  Skin rash.  Lower extremity healing probable contact dermatitis.  Multiple fibrocystic changes throughout both breasts.  Follow-up ultrasound as outlined by GYN       Assessment & Plan:

## 2010-08-02 NOTE — Patient Instructions (Signed)
The condition affecting your upper arms is called subclavian steal syndrome.  Apply small amounts of cortisone cream to the skin rash.  When your legs, and  Returned for your physical

## 2010-08-05 ENCOUNTER — Other Ambulatory Visit: Payer: BC Managed Care – PPO

## 2010-08-06 ENCOUNTER — Ambulatory Visit (INDEPENDENT_AMBULATORY_CARE_PROVIDER_SITE_OTHER): Payer: BC Managed Care – PPO | Admitting: Family Medicine

## 2010-08-06 ENCOUNTER — Encounter: Payer: Self-pay | Admitting: Family Medicine

## 2010-08-06 ENCOUNTER — Telehealth: Payer: Self-pay | Admitting: *Deleted

## 2010-08-06 VITALS — BP 100/62 | Temp 98.3°F | Wt 123.0 lb

## 2010-08-06 DIAGNOSIS — R5383 Other fatigue: Secondary | ICD-10-CM

## 2010-08-06 DIAGNOSIS — R208 Other disturbances of skin sensation: Secondary | ICD-10-CM

## 2010-08-06 DIAGNOSIS — R209 Unspecified disturbances of skin sensation: Secondary | ICD-10-CM

## 2010-08-06 DIAGNOSIS — G4700 Insomnia, unspecified: Secondary | ICD-10-CM

## 2010-08-06 NOTE — Telephone Encounter (Signed)
Appt made with Dr. Caryl Never today.

## 2010-08-06 NOTE — Patient Instructions (Signed)

## 2010-08-06 NOTE — Progress Notes (Signed)
  Subjective:    Patient ID: Regina Mendez, female    DOB: [birth year], 50 y.o.   MRN: [id number]  HPI Patient seen with multiple complaints. Mostly complaining of some intermittent dysesthesias and numbness involving mostly upper extremities. Occasional fleeting dysesthesias lower extremities. Symptoms worse at night. Recently seen with concern for subclavian steal syndrome. Her numbness usually lasts seconds and improves with change of position. No associated neck pain or any weakness. Denies any visual changes or headache. No ataxic symptoms.  Complains of increased fatigue. Complete physical scheduled. Labs for physical reviewed and basically normal. Increased stress with job and home responsibilities. Recently postmenopausal on estrogen replacement. Frequently only gets a few hours sleep per night. No significant caffeine use. No regular alcohol use.  Initially complained of some dyspnea. No dyspnea with exercise and no chest pain. She feels this may be more stress related symptom  Past Medical History  Diagnosis Date  . Breast cyst    Past Surgical History  Procedure Date  . Tonsillectomy     reports that she has never smoked. She does not have any smokeless tobacco history on file. She reports that she does not drink alcohol or use illicit drugs. family history includes Breast cancer in her maternal aunt; Heart disease in her father; Hypertension in her father; and Thyroid cancer in her mother. Allergies  Allergen Reactions  . Ciprofloxacin   . Codeine   . Sulfonamide Derivatives       Review of Systems  Constitutional: Positive for fatigue. Negative for fever, chills, activity change, appetite change and unexpected weight change.  Respiratory: Negative for cough and wheezing.   Cardiovascular: Negative for chest pain, palpitations and leg swelling.  Gastrointestinal: Negative for abdominal pain, blood in stool and abdominal distention.  Genitourinary: Negative for dysuria.    Neurological: Negative for dizziness, seizures, syncope and headaches.  Hematological: Negative for adenopathy. Does not bruise/bleed easily.  Psychiatric/Behavioral: Negative for dysphoric mood.       Objective:   Physical Exam  Constitutional: She is oriented to person, place, and time. She appears well-developed and well-nourished. No distress.  HENT:  Right Ear: External ear normal.  Left Ear: External ear normal.  Mouth/Throat: Oropharynx is clear and moist. No oropharyngeal exudate.  Eyes: Pupils are equal, round, and reactive to light.  Neck: Neck supple. No thyromegaly present.  Cardiovascular: Normal rate and regular rhythm.   No murmur heard. Pulmonary/Chest: Effort normal and breath sounds normal. No respiratory distress. She has no wheezes. She has no rales.  Musculoskeletal: She exhibits no edema.       She has good palpable pulses in both wrists and both feet Excellent capillary refill  Lymphadenopathy:    She has no cervical adenopathy.  Neurological: She is alert and oriented to person, place, and time. She has normal reflexes. No cranial nerve deficit. Coordination normal.       No focal weakness. Cerebellar function normal. Gait normal.  Normal sensory to touch. Romberg normal  Skin: No rash noted.  Psychiatric: She has a normal mood and affect. Her behavior is normal.          Assessment & Plan:  #1 transient dysesthesias mostly upper extremities. Suspect positional. Reassurance given. Touch base with primary if persist or worsen #2 fatigue probably related to poor sleep quality. Sleep hygiene discussed with handout given.  Recent labs normal. #3 increased stress issues. Probably contributing to #2

## 2010-08-06 NOTE — Telephone Encounter (Signed)
Pt not feeling well, arm pain, no energy, tingling in feet.  Patient has appt for cpx on 08/12/10 to see Dr. Tawanna Cooler offered pt appointment to come in sooner to see another physician.  Pt states she would rather speak with nurse, requesting a call back.

## 2010-08-12 ENCOUNTER — Ambulatory Visit (INDEPENDENT_AMBULATORY_CARE_PROVIDER_SITE_OTHER): Payer: BC Managed Care – PPO | Admitting: Family Medicine

## 2010-08-12 DIAGNOSIS — G43909 Migraine, unspecified, not intractable, without status migrainosus: Secondary | ICD-10-CM

## 2010-08-12 DIAGNOSIS — N951 Menopausal and female climacteric states: Secondary | ICD-10-CM

## 2010-08-12 DIAGNOSIS — J309 Allergic rhinitis, unspecified: Secondary | ICD-10-CM

## 2010-08-12 DIAGNOSIS — J45909 Unspecified asthma, uncomplicated: Secondary | ICD-10-CM

## 2010-08-12 DIAGNOSIS — N63 Unspecified lump in unspecified breast: Secondary | ICD-10-CM

## 2010-08-12 MED ORDER — ALPRAZOLAM 0.5 MG PO TABS: 0.5000 mg | ORAL_TABLET | Freq: Every evening | ORAL | Status: DC | PRN

## 2010-08-12 MED ORDER — ZOLPIDEM TARTRATE ER 12.5 MG PO TBCR: 12.5000 mg | EXTENDED_RELEASE_TABLET | Freq: Every evening | ORAL | Status: DC | PRN

## 2010-08-12 NOTE — Patient Instructions (Signed)
Continue your current medications.  Follow-up with your GYN about the vaginal bleeding.  Return in one year, sooner if any problem

## 2010-08-12 NOTE — Progress Notes (Signed)
  Subjective:    Patient ID: Regina Mendez, female    DOB: Dec 28, 1960, 50 y.o.   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  She has developed early menopause and is under the care of Dr. Auburn Mendez. And is on hormone replacement therapy.  She has fibrocystic breast disease and does breast exam monthly and gets annual mammography.  She has a history of allergic rhinitis, for which he takes over-the-counter medication.  She has a history of migraine headaches for which She takes Motrin p.r.n.  She also has a history of subclavian steal syndrome, currently asymptomatic as long as she doesn't raise her arms above her head.  She has not had a period in two years recently today started bleeding.  Recommended she call her GYN to inform him of that fact.  She does have an occasional flare of asthma, currently on immunotherapy and not taking her Qvar at nor Proventil.  Review of Systems  Constitutional: Negative.   HENT: Negative.   Eyes: Negative.   Respiratory: Negative.   Cardiovascular: Negative.   Gastrointestinal: Negative.   Genitourinary: Negative.   Musculoskeletal: Negative.   Neurological: Negative.   Hematological: Negative.   Psychiatric/Behavioral: Negative.        Objective:   Physical Exam  Constitutional: She appears well-developed and well-nourished.  HENT:  Head: Normocephalic and atraumatic.  Right Ear: External ear normal.  Left Ear: External ear normal.  Nose: Nose normal.  Mouth/Throat: Oropharynx is clear and moist.  Eyes: EOM are normal. Pupils are equal, round, and reactive to light.  Neck: Normal range of motion. Neck supple. No thyromegaly present.  Cardiovascular: Normal rate, regular rhythm, normal heart sounds and intact distal pulses.  Exam reveals no gallop and no friction rub.   No murmur heard. Pulmonary/Chest: Effort normal and breath sounds normal.    Abdominal: Soft. Bowel sounds are normal. She exhibits no distension and no mass. There is no tenderness. There is no rebound.  Genitourinary:       Bilateral breast exam shows multiple cysts throughout both breasts consistent with fibrocystic disease  Musculoskeletal: Normal range of motion.  Lymphadenopathy:    She has no cervical adenopathy.  Neurological: She is alert. She has normal reflexes. No cranial nerve deficit. She exhibits normal muscle tone. Coordination normal.  Skin: Skin is warm and dry.  Psychiatric: She has a normal mood and affect. Her behavior is normal. Judgment and thought content normal.          Assessment & Plan:  Healthy female.  Severe postmenopausal symptoms, now with vaginal bleeding.  Call GYN.  History of allergic rhinitis and asthma.  Continue immunotherapy.  History of migraine headaches treated with OTC Motrin.  History of sleep dysfunction.  Ambien p.r.n.  History of anxiety.  Xanax .5 p.r.n.  History of extensive fibrocystic breast disease.  BSE monthly annual mammography.  History of colon polyps recent colonoscopy normal.

## 2010-08-20 ENCOUNTER — Ambulatory Visit
Admission: RE | Admit: 2010-08-20 | Discharge: 2010-08-20 | Disposition: A | Discharge status: Home / Self Care | Payer: BC Managed Care – PPO | Source: Ambulatory Visit | Attending: Obstetrics and Gynecology | Admitting: Obstetrics and Gynecology

## 2010-08-20 ENCOUNTER — Other Ambulatory Visit: Payer: Self-pay | Admitting: Obstetrics and Gynecology

## 2010-08-20 DIAGNOSIS — N649 Disorder of breast, unspecified: Secondary | ICD-10-CM

## 2010-09-07 ENCOUNTER — Telehealth: Payer: Self-pay

## 2010-09-07 MED ORDER — ESTRADIOL 0.1 MG/24HR TD PTWK: 1.0000 | MEDICATED_PATCH | TRANSDERMAL | Status: DC

## 2010-09-07 NOTE — Telephone Encounter (Signed)
Increase the dose of her patch .1 mg Climara. She does is once a week.if not better in 4 weeks needs a serum estradiol drawn.

## 2010-09-07 NOTE — Telephone Encounter (Signed)
PT. NOTIFIED OF DR. GOTTSEGENS NOTE BELOW & I SENT RX TO [facility]. PT . STATES NO PERIOD THIS PAST MONTH. I ADVISED HER JUST TO SEE DR. GOTTSEGEN IF THIS OCCURS AGAIN & THEY CAN DRAW SERUM ESTRADIOL THEN IF STILL HAVING PERSISTENT HOT FLASHES ON NEW DOSE.

## 2010-09-07 NOTE — Telephone Encounter (Signed)
SEE 08/12/10 TRIAGE NOTE. C-O PERSISTENT ALL DAY ALL NIGHT HOT FLASHES WHEN YOU INCREASED HER DOSE 08/12/10. REQUESTING TO GET DOSE INCREASED AGAIN SINCE SO FAR NO HELP WITH HOT FLASHES.

## 2010-09-27 ENCOUNTER — Ambulatory Visit (INDEPENDENT_AMBULATORY_CARE_PROVIDER_SITE_OTHER): Payer: BC Managed Care – PPO | Admitting: Family Medicine

## 2010-09-27 ENCOUNTER — Encounter: Payer: Self-pay | Admitting: Family Medicine

## 2010-09-27 DIAGNOSIS — R42 Dizziness and giddiness: Secondary | ICD-10-CM | POA: Insufficient documentation

## 2010-09-27 NOTE — Progress Notes (Signed)
  Subjective:    Patient ID: Regina Mendez, female    DOB: 11/18/1960, 50 y.o.   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  She states couple days ago.  She felt some congestion and then 3 days ago started having episodes of vertigo.  She states her worse if she turns her head to the left and looks down.  It lasts for a few seconds and goes away.  It's more of a rocking sensation on a boat than true vertigo.  If showing a lesion of her sister called 3 weeks ago to tell her that she has vertigo.  No headache, hearing loss, etc.  Review of Systems    Generally needed.  The review of systems otherwise negative Objective:   Physical Exam  Well-developed well-nourished, female, in no acute distress.  Examination HEENT negative.  Neck supple.  No adenopathy.  Neurologic exam normal.  Gait normal cerebellar testing normal      Assessment & Plan:  Benign positional vertigo.  Plan reassured

## 2010-09-28 ENCOUNTER — Telehealth: Payer: Self-pay | Admitting: *Deleted

## 2010-09-28 DIAGNOSIS — J309 Allergic rhinitis, unspecified: Secondary | ICD-10-CM

## 2010-09-28 DIAGNOSIS — R42 Dizziness and giddiness: Secondary | ICD-10-CM

## 2010-09-28 DIAGNOSIS — J019 Acute sinusitis, unspecified: Secondary | ICD-10-CM

## 2010-09-28 DIAGNOSIS — J45909 Unspecified asthma, uncomplicated: Secondary | ICD-10-CM

## 2010-09-28 DIAGNOSIS — H9209 Otalgia, unspecified ear: Secondary | ICD-10-CM

## 2010-09-28 NOTE — Telephone Encounter (Signed)
ENT consult by Dr. Narda Bonds,,,,,,,,, patient can make her own appointment

## 2010-09-28 NOTE — Telephone Encounter (Signed)
Called and left message with husband.

## 2010-09-28 NOTE — Telephone Encounter (Signed)
Pt would like ENT referral for ear pain, please.  May leave message on her phone.

## 2011-02-16 ENCOUNTER — Telehealth: Payer: Self-pay | Admitting: *Deleted

## 2011-02-16 NOTE — Telephone Encounter (Signed)
Regarding a therpist: Regina Mendez office is off of [address] She is in the phone book under psychologist and she does a lot of sex counselling.  Regarding her period: Assuming that she still takes her Prometrium first 12 days of month it is normal to have a cycle around the middle part of the month. It may be more noticeable now since she increased  the strength of her patch. If it is annoying her we can change how she takes the Prometrium but sometimes that'll cause persistent spotting. Please let me know what her bleeding pattern is.

## 2011-02-16 NOTE — Telephone Encounter (Signed)
Lm for pt to call

## 2011-02-16 NOTE — Telephone Encounter (Signed)
Pt called wanted to let you know as FYI that she is having a period every month on her climara patch 0.1 mg. She also wanted to know if you could recommend a Doctor or therapist for her and her husband to see. They are not having sexual relations as much and this has been going on for 2 years per patient. If you do know of a therapist or doctor pt would possible like a female, they are seeing Dr. Rubye Oaks and pt doesn't think he is much help in sex department. Please advise

## 2011-02-16 NOTE — Telephone Encounter (Signed)
Pt informed with the below note, and she is fine with having period every month. She is still taken prometrium 12 days of month.

## 2011-04-01 ENCOUNTER — Other Ambulatory Visit: Payer: Self-pay | Admitting: *Deleted

## 2011-04-01 MED ORDER — ESTRADIOL 0.1 MG/24HR TD PTWK: 1.0000 | MEDICATED_PATCH | TRANSDERMAL | Status: DC

## 2011-04-01 NOTE — Progress Notes (Signed)
Pharmacy requested 90 d rx [place].  rx sent

## 2011-05-18 ENCOUNTER — Other Ambulatory Visit: Payer: Self-pay | Admitting: Family Medicine

## 2011-05-21 ENCOUNTER — Other Ambulatory Visit: Payer: Self-pay | Admitting: Family Medicine

## 2011-05-30 ENCOUNTER — Other Ambulatory Visit: Payer: Self-pay | Admitting: Family Medicine

## 2011-05-30 MED ORDER — LORAZEPAM 0.5 MG PO TABS: 0.5000 mg | ORAL_TABLET | Freq: Every evening | ORAL | Status: AC | PRN

## 2011-05-30 NOTE — Telephone Encounter (Signed)
Pt stated she just use med as needed

## 2011-05-30 NOTE — Telephone Encounter (Signed)
This prescription has been denied per Dr Tawanna Cooler.

## 2011-05-30 NOTE — Telephone Encounter (Signed)
Switch to Ativan 0.5 #30 directions 1 by mouth each bedtime when necessary refills x5

## 2011-05-30 NOTE — Telephone Encounter (Signed)
Pt is requesting refill on xanax call into [facility]. Pt has ov sch for 06-06-2011 430p

## 2011-06-06 ENCOUNTER — Ambulatory Visit: Payer: BC Managed Care – PPO | Admitting: Family Medicine

## 2011-08-02 ENCOUNTER — Telehealth: Payer: Self-pay | Admitting: Family Medicine

## 2011-08-02 ENCOUNTER — Ambulatory Visit (INDEPENDENT_AMBULATORY_CARE_PROVIDER_SITE_OTHER): Payer: BC Managed Care – PPO | Admitting: Family Medicine

## 2011-08-02 ENCOUNTER — Encounter: Payer: Self-pay | Admitting: Family Medicine

## 2011-08-02 VITALS — BP 108/68 | Temp 98.9°F | Wt 122.0 lb

## 2011-08-02 DIAGNOSIS — G4700 Insomnia, unspecified: Secondary | ICD-10-CM

## 2011-08-02 DIAGNOSIS — F5104 Psychophysiologic insomnia: Secondary | ICD-10-CM

## 2011-08-02 DIAGNOSIS — M546 Pain in thoracic spine: Secondary | ICD-10-CM

## 2011-08-02 NOTE — Telephone Encounter (Signed)
[facility] Triage Call Report Triage Record Num: [id number] Operator: Migdalia Dk Patient Name: Regina Mendez Call Date & Time: 08/02/2011 12:44:27AM Patient Phone: [phone] PCP: Eugenio Hoes. Todd Patient Gender: Female PCP Fax : [phone] Patient DOB: [birth year] [facility] Name: [facility] Reason for Call: Caller: [name]; PCP: Roderick Pee.; CB#: [phone]; Call regarding Muscle and joint pain; States started with lower back pain and muscle/joint pain on 07/31/2011. Continues tonight with lower back pain and pain in joints. States does do Yoga. No cold or congestion, afebrile. Care advice given per Back Symptoms Guideline, will follow up with office in a.m. if symptoms persist. Protocol(s) Used: Back Symptoms Protocol(s) Used: Muscle Pain Recommended Outcome per Protocol: See Provider within 72 Hours Reason for Outcome: Muscle aches/pain associated with back pain Mild to moderate pain in back with normal activity or rest AND not responding to 72 hours of home care Care Advice: ~ Call provider if symptoms worsen or new symptoms develop. ~ Avoid heavy lifting, bending and twisting of the back, and prolonged sitting until evaluated by provider. Apply a cloth-covered cold or ice pack to the area for 20 minutes 4 to 8 times a day for relief of pain for the first 24-48 hours. After 24 to 48 hours of cold application, use a cloth-covered heat pack to the area for 20 minutes 3 to 4 times a day. ~ Sleep on a moderately firm mattress. Try sleeping on back with a pillow placed under knees or sleep on side with knees bent and a pillow between knees. When lying on stomach, place pillow under the abdomen and pelvis; do not use a pillow for your head. ~ Go to the ED if you have worsening pain, numbness or weakness of arms or legs, cannot walk, or have new unexplained changes in bladder or bowel function. Another adult should drive. ~ ~ Avoid activity that causes or  worsens symptoms. Analgesic/Antipyretic Advice - NSAIDs: Consider aspirin, ibuprofen, naproxen or ketoprofen for pain or fever as directed on label or by pharmacist/provider. PRECAUTIONS: - If over 51 years of age, should not take longer than 1 week without consulting provider. EXCEPTIONS: - Should not be used if taking blood thinners or have bleeding problems. - Do not use if have history of sensitivity/allergy to any of these medications; or history of cardiovascular, ulcer, kidney, liver disease or diabetes unless approved by provider. - Do not exceed recommended dose or frequency. ~

## 2011-08-02 NOTE — Progress Notes (Signed)
  Subjective:    Patient ID: Regina Mendez, female    DOB: [birth year], 51 y.o.   MRN: [id number]  HPI  Acute visit. Left midthoracic back pain. Increased after doing yard work especially with shoveling. No specific injury. She tried ice which help. Heat and massage without relief. Pain is sharp. Worse with bending. Mild relief with ibuprofen. Denies any cervical or lumbar pain. No skin rash. No dyspnea. No pleuritic pain.  Insomnia. Somewhat chronic. Generally falls asleep and early morning awakening. No depression. No significant caffeine use. No regular alcohol use. She tried some type of herbal supplement which helped.   Review of Systems  Constitutional: Negative for appetite change and unexpected weight change.  Respiratory: Negative for cough and shortness of breath.   Cardiovascular: Negative for chest pain.  Musculoskeletal: Positive for back pain.  Neurological: Negative for weakness, numbness and headaches.       Objective:   Physical Exam  Constitutional: She is oriented to person, place, and time. She appears well-developed and well-nourished.  Neck: Neck supple.  Cardiovascular: Normal rate and regular rhythm.   Pulmonary/Chest: Effort normal and breath sounds normal. No respiratory distress. She has no wheezes. She has no rales.  Musculoskeletal:       Minimal left thoracic muscular tenderness. No spinal tenderness. Full range of motion cervical spine  Neurological: She is alert and oriented to person, place, and time.  Skin: No rash noted.  Psychiatric: She has a normal mood and affect. Her behavior is normal.          Assessment & Plan:  #1 left thoracic back pain. Suspect muscular. Continue heat or ice. Continue ibuprofen. Stretches recommended. She will continue with yoga classes. #2 insomnia. Sleep hygiene discussed. Avoid regular use of sedative hypnotics

## 2011-08-02 NOTE — Patient Instructions (Addendum)

## 2011-08-03 ENCOUNTER — Telehealth: Payer: Self-pay | Admitting: Family Medicine

## 2011-08-03 NOTE — Telephone Encounter (Signed)
Caller: Shanan/Patient; PCP: Roderick Pee.; CB#: [phone]; ; ; Call regarding Requesting Office Message;  LMP 07/11/11. Patient states she was seen in office 08/02/11. Patient states she developed body aches, sore throat, fatigue,  head congestion. Onset 08/02/11 p.m. Patient states she was afebrile. Patient states she has similar sx each night, onset 07/29/11. Patient describes as "very strong flu like sx"  States she only has sx in the evening but resolve during the day. Patient denies sore throat 08/03/11. Patient states she also develops "muscular spasms" in back radiating around waist, onset 07/31/11. States sx were evaluated in office 08/02/11.  Describes as contracting and twitching. States has been using Ibuprofen 200mg . and ice without relief. Patient taking fluids well. Denies urinary sx.  Triage per Back Symptoms and Abdominal Pain Protocol. No emergent sx identified. Care advice given per guidelines. Patient advised heat or ice for discomfort. Patient advised to increase Ibuprofen to 600mg . q 6 hours prn for pain, advised to take with food. Call back parameters reviewed. Patient verbalizes understanding.  PATIENT REQUESTING NOTE TO MD. PLEASE REVIEW ABOVE. PATIENT CAN BE REACHED @ [phone].

## 2011-08-03 NOTE — Telephone Encounter (Signed)
Agree with ibuprofen. We discussed possible muscle relaxer but patient was very reluctant yesterday. Suspect she may have viral syndrome accounting for her body aches, sore throat, and nasal congestion.

## 2011-08-03 NOTE — Telephone Encounter (Signed)
Left detailed message on machine for patient 

## 2011-08-09 ENCOUNTER — Encounter: Payer: BC Managed Care – PPO | Admitting: Obstetrics and Gynecology

## 2011-08-11 ENCOUNTER — Encounter: Payer: Self-pay | Admitting: Obstetrics and Gynecology

## 2011-08-11 ENCOUNTER — Ambulatory Visit (INDEPENDENT_AMBULATORY_CARE_PROVIDER_SITE_OTHER): Payer: BC Managed Care – PPO | Admitting: Obstetrics and Gynecology

## 2011-08-11 ENCOUNTER — Telehealth: Payer: Self-pay | Admitting: *Deleted

## 2011-08-11 ENCOUNTER — Telehealth: Payer: Self-pay

## 2011-08-11 VITALS — BP 100/60 | Ht 65.0 in | Wt 123.0 lb

## 2011-08-11 DIAGNOSIS — Z01419 Encounter for gynecological examination (general) (routine) without abnormal findings: Secondary | ICD-10-CM

## 2011-08-11 DIAGNOSIS — N879 Dysplasia of cervix uteri, unspecified: Secondary | ICD-10-CM | POA: Insufficient documentation

## 2011-08-11 MED ORDER — ESTRADIOL 0.1 MG/24HR TD PTWK: 1.0000 | MEDICATED_PATCH | TRANSDERMAL | Status: DC

## 2011-08-11 MED ORDER — PROGESTERONE MICRONIZED 200 MG PO CAPS: ORAL_CAPSULE | ORAL | Status: DC

## 2011-08-11 NOTE — Patient Instructions (Signed)
Schedule mammogram. Find out if cousins had been checked for BRCA1 and BRCA2.

## 2011-08-11 NOTE — Telephone Encounter (Signed)
Pharmacy didn't receive the whole rx for the progesterone 200 mg tablets, rx will be re-sent

## 2011-08-11 NOTE — Telephone Encounter (Signed)
I PUT CHART IN YOUR IN BOX.

## 2011-08-11 NOTE — Telephone Encounter (Signed)
WANTS TO KNOW IF IT IS NOW SAFE TO HAVE UNPROTECTED SEX WITH HER SPOUSE AND WANT TO KNOW WHY SHE IS STILL HAVING PERIODS?

## 2011-08-11 NOTE — Progress Notes (Signed)
Patient came to see me today for her annual GYN exam. She has increased her estrogen patch strength and feels much better. She is however now having withdrawal bleeding on the higher strength. She has been on hormone replacement for over 5 years and is aware of the higher risk of breast cancer. She has 2 maternal aunts both who had daughters  who had early onset breast cancer. One was in her 30s. They live in [place]. She is not sure if they've been checked for BRCA1 and BRCA2. The patient sees Dr. Tawanna Cooler who does her lab work. She is overdue for mammogram. She is having no pelvic pain. She had a normal bone density in 2010. She was treated with cryosurgery for cervical dysplasia greater than 10 years ago and has had normal Paps since then. Her last Pap was June, 2012.  Physical examination: Kennon Portela present. HEENT within normal limits. Neck: Thyroid not large. No masses. Supraclavicular nodes: not enlarged. Breasts: Examined in both sitting and lying  position. No skin changes and no masses. Although  patient has no dominant lesions she is diffusely lumpy bilaterally. Abdomen: Soft no guarding rebound or masses or hernia. Pelvic: External: Within normal limits. BUS: Within normal limits. Vaginal:within normal limits. Good estrogen effect. No evidence of cystocele rectocele or enterocele. Cervix: clean. Uterus: Normal size and shape. Adnexa: No masses. Rectovaginal exam: Confirmatory and negative. Extremities: Within normal limits.  Assessment: #1. Menopausal symptoms #2. Family history of early onset breast cancer #3. History of cervical dysplasia  Plan: Continue Climara patch 0.1 mg with cyclical Prometrium. Asked  patient to get a mammogram now. Patient to see if cousins have been checked for BRCA1 and BRCA2. She will inform me.The new Pap smear guidelines were discussed with the patient. No pap done.

## 2011-08-11 NOTE — Telephone Encounter (Signed)
PER G OK TO HAVE UNPROTECTED INTERCOURSE AND HAVING PERIODS DUE TO TAKING HORMONES PER G'S NOTE BELOW.

## 2011-08-11 NOTE — Telephone Encounter (Signed)
She is having periods due to the hormone she's taking. I think it'll probably be safe to have unprotected sex but I need to look at her chart to be sure.

## 2011-08-12 LAB — URINALYSIS W MICROSCOPIC + REFLEX CULTURE
Glucose, UA: NEGATIVE mg/dL
Leukocytes, UA: NEGATIVE
Protein, ur: NEGATIVE mg/dL
Specific Gravity, Urine: 1.005 (ref 1.005–1.030)
Squamous Epithelial / LPF: NONE SEEN
pH: 7.5 (ref 5.0–8.0)

## 2011-08-15 ENCOUNTER — Other Ambulatory Visit: Payer: Self-pay | Admitting: Family Medicine

## 2011-08-15 DIAGNOSIS — Z1231 Encounter for screening mammogram for malignant neoplasm of breast: Secondary | ICD-10-CM

## 2011-08-16 ENCOUNTER — Encounter: Payer: Self-pay | Admitting: Family Medicine

## 2011-08-16 ENCOUNTER — Ambulatory Visit (INDEPENDENT_AMBULATORY_CARE_PROVIDER_SITE_OTHER): Payer: BC Managed Care – PPO | Admitting: Family Medicine

## 2011-08-16 VITALS — BP 90/62 | Temp 98.4°F | Wt 124.0 lb

## 2011-08-16 DIAGNOSIS — M2550 Pain in unspecified joint: Secondary | ICD-10-CM

## 2011-08-16 NOTE — Patient Instructions (Signed)
Take Motrin 600 mg twice daily for muscle and joint pain  I will call you to couple days when he gets her lab work back

## 2011-08-16 NOTE — Progress Notes (Signed)
  Subjective:    Patient ID: Regina Mendez, female    DOB: 10-21-1960, 51 y.o.   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 She states her symptoms come and go. It involves her wrists elbows shoulders and back. She's also having trouble with spasms of the muscles in her back to come and go. No history of trauma  No visual difficulties sore throat cough nausea vomiting diarrhea or urinary tract symptoms. LMP 4 days ago. She is on HRT via Dr. dGE because of premature menopause. She also has allergic rhinitis and history of asthma but those conditions are currently under good control.  Family history positive for arthritis she's not sure what type   Review of Systems    general and neurologic and orthopedic review of systems otherwise normal Objective:   Physical Exam  Well-developed well-nourished female in no acute distress examination of muscles and joints are normal no tenderness full range of motion of all joints. Cardiopulmonary exam normal abdominal exam normal skin normal ENT normal      Assessment & Plan:  Diffuse arthralgias unknown etiology plan check thyroid level

## 2011-08-17 LAB — RHEUMATOID FACTOR: Rhuematoid fact SerPl-aCnc: 18 IU/mL — ABNORMAL HIGH (ref <=14)

## 2011-08-17 LAB — CBC WITH DIFFERENTIAL/PLATELET
Eosinophils Relative: 4.1 % (ref 0.0–5.0)
Lymphocytes Relative: 26 % (ref 12.0–46.0)
MCV: 96.8 fl (ref 78.0–100.0)
Monocytes Absolute: 0.8 10*3/uL (ref 0.1–1.0)
Monocytes Relative: 10.4 % (ref 3.0–12.0)
Neutrophils Relative %: 59.1 % (ref 43.0–77.0)
Platelets: 242 10*3/uL (ref 150.0–400.0)
WBC: 7.8 10*3/uL (ref 4.5–10.5)

## 2011-08-17 LAB — BASIC METABOLIC PANEL
BUN: 9 mg/dL (ref 6–23)
Calcium: 8.8 mg/dL (ref 8.4–10.5)
Creatinine, Ser: 0.7 mg/dL (ref 0.4–1.2)
GFR: 91.01 mL/min (ref >60.00)

## 2011-08-17 LAB — VITAMIN B12: Vitamin B-12: 819 pg/mL (ref 211–911)

## 2011-08-17 LAB — SEDIMENTATION RATE: Sed Rate: 4 mm/hr (ref 0–22)

## 2011-08-19 ENCOUNTER — Ambulatory Visit
Admission: RE | Admit: 2011-08-19 | Discharge: 2011-08-19 | Disposition: A | Discharge status: Home / Self Care | Payer: BC Managed Care – PPO | Source: Ambulatory Visit | Attending: Family Medicine | Admitting: Family Medicine

## 2011-08-19 DIAGNOSIS — Z1231 Encounter for screening mammogram for malignant neoplasm of breast: Secondary | ICD-10-CM

## 2011-09-08 ENCOUNTER — Telehealth: Payer: Self-pay | Admitting: *Deleted

## 2011-09-08 NOTE — Telephone Encounter (Signed)
Pt called asking name of gene testing Dr.G asked to check if cousin had BRCA1 AND BRCA 2 given to pt.

## 2011-09-27 ENCOUNTER — Other Ambulatory Visit: Payer: BC Managed Care – PPO

## 2011-10-06 ENCOUNTER — Encounter: Payer: Self-pay | Admitting: Family Medicine

## 2011-10-06 ENCOUNTER — Other Ambulatory Visit (HOSPITAL_COMMUNITY)
Admission: RE | Admit: 2011-10-06 | Discharge: 2011-10-06 | Disposition: A | Discharge status: Home / Self Care | Payer: BC Managed Care – PPO | Source: Ambulatory Visit | Attending: Family Medicine | Admitting: Family Medicine

## 2011-10-06 ENCOUNTER — Ambulatory Visit (INDEPENDENT_AMBULATORY_CARE_PROVIDER_SITE_OTHER): Payer: BC Managed Care – PPO | Admitting: Family Medicine

## 2011-10-06 VITALS — BP 94/64 | Temp 98.2°F | Ht 65.0 in | Wt 128.0 lb

## 2011-10-06 DIAGNOSIS — N951 Menopausal and female climacteric states: Secondary | ICD-10-CM

## 2011-10-06 DIAGNOSIS — N63 Unspecified lump in unspecified breast: Secondary | ICD-10-CM

## 2011-10-06 DIAGNOSIS — Z23 Encounter for immunization: Secondary | ICD-10-CM

## 2011-10-06 DIAGNOSIS — Z0000 Encounter for general adult medical examination without abnormal findings: Secondary | ICD-10-CM

## 2011-10-06 DIAGNOSIS — Z13220 Encounter for screening for lipoid disorders: Secondary | ICD-10-CM

## 2011-10-06 DIAGNOSIS — Z01419 Encounter for gynecological examination (general) (routine) without abnormal findings: Secondary | ICD-10-CM | POA: Insufficient documentation

## 2011-10-06 LAB — LIPID PANEL
Cholesterol: 190 mg/dL (ref 0–200)
Total CHOL/HDL Ratio: 4
Triglycerides: 250 mg/dL — ABNORMAL HIGH (ref 0.0–149.0)

## 2011-10-06 LAB — HEPATIC FUNCTION PANEL
AST: 24 U/L (ref 0–37)
Alkaline Phosphatase: 46 U/L (ref 39–117)
Bilirubin, Direct: 0 mg/dL (ref 0.0–0.3)
Total Protein: 7.3 g/dL (ref 6.0–8.3)

## 2011-10-06 NOTE — Progress Notes (Signed)
  Subjective:    Patient ID: Regina Mendez, female    DOB: 1960/03/03, 51 y.o.   MRN: [id number]  HPIveronica is a delightful 51 year old married female nonsmoker who comes in today for general physical examination  Her current medications include an estrogen patch weekly Prometrium 200 mg days 1 through 12. With this she has a period once a month. She went through premature menopause.  She has a history of fibrocystic breast changes does not check her breasts monthly but does get an annual mammogram.  She gets routine eye care, dental care, colonoscopy every 5 years because of a history of colon polyps, tetanus 2005.  She also has a history of cervical dysplasia HPV related and would like a Pap smear she saw her GYN doctor DG but he felt like he did need a Pap smear but every 3 years    Review of Systems  Constitutional: Negative.   HENT: Negative.   Eyes: Negative.   Respiratory: Negative.   Cardiovascular: Negative.   Gastrointestinal: Negative.   Genitourinary: Negative.   Musculoskeletal: Negative.   Neurological: Negative.   Hematological: Negative.   Psychiatric/Behavioral: Negative.        Objective:   Physical Exam  Constitutional: She appears well-developed and well-nourished.  HENT:  Head: Normocephalic and atraumatic.  Right Ear: External ear normal.  Left Ear: External ear normal.  Nose: Nose normal.  Mouth/Throat: Oropharynx is clear and moist.  Eyes: EOM are normal. Pupils are equal, round, and reactive to light.  Neck: Normal range of motion. Neck supple. No thyromegaly present.  Cardiovascular: Normal rate, regular rhythm, normal heart sounds and intact distal pulses.  Exam reveals no gallop and no friction rub.   No murmur heard. Pulmonary/Chest: Effort normal and breath sounds normal.  Abdominal: Soft. Bowel sounds are normal. She exhibits no distension and no mass. There is no tenderness. There is no rebound.  Genitourinary: Vagina normal and uterus  normal. Guaiac negative stool. No vaginal discharge found.       Bilateral breast exam shows multiple cystic lesions throughout both breasts scar right breast 6:00 from previous biopsy  Musculoskeletal: Normal range of motion.  Lymphadenopathy:    She has no cervical adenopathy.  Neurological: She is alert. She has normal reflexes. No cranial nerve deficit. She exhibits normal muscle tone. Coordination normal.  Skin: Skin is warm and dry.  Psychiatric: She has a normal mood and affect. Her behavior is normal. Judgment and thought content normal.          Assessment & Plan:  Healthy female  Early menopause continue estrogen patch and Prometrium  History of HPV recommended yearly Pap

## 2011-10-06 NOTE — Patient Instructions (Addendum)
Continue your current medications  Followup for a complete physical exam in one year sooner if any problem

## 2011-10-07 LAB — POCT URINALYSIS DIPSTICK
Bilirubin, UA: NEGATIVE
Blood, UA: NEGATIVE
Glucose, UA: NEGATIVE
Ketones, UA: NEGATIVE
Leukocytes, UA: NEGATIVE
Nitrite, UA: NEGATIVE
pH, UA: 7

## 2011-10-11 ENCOUNTER — Telehealth: Payer: Self-pay | Admitting: Family Medicine

## 2011-10-11 NOTE — Telephone Encounter (Signed)
Pt states she had a missed call from our office around 10:00 this morning.  States she does not have voicemail.  I advised pt that she does have lab results that the nurse may be trying to relay to her.  Please return call back to pt.

## 2011-10-11 NOTE — Telephone Encounter (Signed)
Spoke with patient.

## 2011-10-17 ENCOUNTER — Telehealth: Payer: Self-pay | Admitting: Family Medicine

## 2011-10-17 NOTE — Telephone Encounter (Signed)
Pt called req refill of ALPRAZolam (XANAX) 0.5 MG tablet at [facility].

## 2011-10-17 NOTE — Telephone Encounter (Signed)
Ativan 0.5,,,,,,,,

## 2011-10-18 NOTE — Telephone Encounter (Signed)
Okay to refill the Xanax 0.5 mg dispense 10 tablets directions one 30 minutes before flying refills x1

## 2011-10-18 NOTE — Telephone Encounter (Signed)
Spoke with patient and she does not use xanax daily only when flying and sometimes sleeping.  She has tried Ativan in the past and it "knocks her out".  Okay to refill xanax?  Should she get a sleep study? She falls asleep but has a had time staying asleep which causes anxiety and goes on a few nights in a row.

## 2011-10-19 MED ORDER — ALPRAZOLAM 0.5 MG PO TABS: ORAL_TABLET | ORAL | Status: DC

## 2011-10-19 NOTE — Telephone Encounter (Signed)
Rx called in and patient is aware 

## 2012-03-13 ENCOUNTER — Encounter: Payer: Self-pay | Admitting: Family Medicine

## 2012-03-13 ENCOUNTER — Ambulatory Visit (INDEPENDENT_AMBULATORY_CARE_PROVIDER_SITE_OTHER): Payer: BC Managed Care – PPO | Admitting: Family Medicine

## 2012-03-13 VITALS — BP 104/68 | Temp 98.9°F | Wt 130.0 lb

## 2012-03-13 DIAGNOSIS — M2550 Pain in unspecified joint: Secondary | ICD-10-CM

## 2012-03-13 NOTE — Progress Notes (Signed)
  Subjective:    Patient ID: Regina Mendez, female    DOB: [birth year], 52 y.o.   MRN: [id number]  HPI  Regina Mendez is a 52 year old married female nonsmoker who comes in today for evaluation of joint pain  She states in December she had a viral infection that lasted about 3 weeks. She treated it symptomatically at home with fluids rest and the saline nasal spray. It did flareup her asthma and she took some albuterol and some Qvar now she is off her inhalers and feels well. About 2 weeks after this episode she began having joint pain and feeling weak. The joint is involved are her fingers wrist elbows hips knees and ankles. No swelling or redness. No history of trauma.  Family history mother has a history of osteoarthritis  Review of Systems    review of systems negative Objective:   Physical Exam Well-developed well-nourished female no acute distress examination of her joints were normal no redness no swelling there was some tenderness.       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

## 2012-03-13 NOTE — Patient Instructions (Signed)
Motrin 600 mg twice daily with food  Return when necessary

## 2012-03-19 ENCOUNTER — Telehealth: Payer: Self-pay | Admitting: Family Medicine

## 2012-03-19 NOTE — Telephone Encounter (Signed)
Pt states MD was going to prescribe a script for ear wax, but she never got. Pls advise Pharm: IT sales professional

## 2012-03-19 NOTE — Telephone Encounter (Signed)
Explained to her that she needs to get an ear wax kit at the pharmacy does not need a prescription

## 2012-03-29 NOTE — Telephone Encounter (Signed)
Left message on machine for patient

## 2012-05-01 ENCOUNTER — Telehealth: Payer: Self-pay | Admitting: Family Medicine

## 2012-05-01 NOTE — Telephone Encounter (Signed)
Patient Information:  Caller Name: Regina Mendez  Phone: [phone]  Patient: Regina Mendez, Alig  Gender: Female  DOB: [birth year]  Age: 52 Years  PCP: Kelle Darting [facility])  Pregnant: No  Office Follow Up:  Does the office need to follow up with this patient?: Yes  Instructions For The Office: Patient does not want to do Ear wax removal. She would like an ENT referral.   Please call her back concerning ENT referral   (2) She has questions about her medication prometrium. She forgot to start it on the 1st of the month.  In March she ended up taking it on the 15th.  This is now April can she take it on the 1st or does she have to wait?  Contact patient at [phone]  RN Note:  Patient does not want to do Ear wax removal. She would like an ENT referral.   Please call her back concerning ENT referral   (2) She has questions about her medication prometrium. She forgot to start it on the 1st of the month.  In March she ended up taking it on the 15th.  This is now April can she take it on the 1st or does she have to wait?  Contact patient at [phone]  Symptoms  Reason For Call & Symptoms: Patient is requesting a referral for ringing in her Ears and wax removal.  She states she has spoke to Dr. Tawanna Cooler concerning wax removal and she does not want to remove it herself. She would like to see a ear physician for ringing and wax removal. She does not remember the name of the ENT she was referred too?  Ringing in Right ear onset today.  Reviewed Health History In EMR: Yes  Reviewed Medications In EMR: Yes  Reviewed Allergies In EMR: Yes  Reviewed Surgeries / Procedures: Yes  Date of Onset of Symptoms: 05/01/2012 OB / GYN:  LMP: 04/17/2012  Guideline(s) Used:  Earwax  Disposition Per Guideline:   See Within 2 Weeks in Office  Reason For Disposition Reached:   Earwax problem and not willing to try home treatment care advice  Advice Given:  Ear Drops - Use For 4 Days To Soften And Remove  Wax:  Ear drops can soften and break up earwax.  Get Debrox or Murine ear drops (OTC) from a pharmacy  Use 5 drops in affected ear, 2 times daily, for 4 days.  Call Back If:   Earache occurs  You become worse.  RN Overrode Recommendation:  Follow Up With Office Later  Patient does not want to do Ear wax removal. She would like an ENT referral.   Please call her back concerning ENT referral   (2) She has questions about her medication prometrium. She forgot to start it on the 1st of the month.  In March she ended up taking it on the 15th.  This is now April can she take it on the 1st or does she have to wait?  Contact patient at [phone]

## 2012-05-02 ENCOUNTER — Ambulatory Visit: Payer: BC Managed Care – PPO | Admitting: Family Medicine

## 2012-05-03 NOTE — Telephone Encounter (Signed)
Spoke with patient and she will call her GYN.  She has seen an ENT for ringing in her ears.

## 2012-08-13 ENCOUNTER — Telehealth: Payer: Self-pay | Admitting: Family Medicine

## 2012-08-13 ENCOUNTER — Encounter: Payer: Self-pay | Admitting: Family Medicine

## 2012-08-13 ENCOUNTER — Ambulatory Visit (INDEPENDENT_AMBULATORY_CARE_PROVIDER_SITE_OTHER): Payer: BC Managed Care – PPO | Admitting: Family Medicine

## 2012-08-13 VITALS — BP 120/80 | Temp 98.3°F | Wt 128.0 lb

## 2012-08-13 DIAGNOSIS — M2550 Pain in unspecified joint: Secondary | ICD-10-CM

## 2012-08-13 DIAGNOSIS — N63 Unspecified lump in unspecified breast: Secondary | ICD-10-CM

## 2012-08-13 LAB — CBC WITH DIFFERENTIAL/PLATELET
Basophils Relative: 0.4 % (ref 0.0–3.0)
Eosinophils Relative: 2.2 % (ref 0.0–5.0)
Hemoglobin: 13.2 g/dL (ref 12.0–15.0)
Lymphocytes Relative: 23.9 % (ref 12.0–46.0)
Neutro Abs: 4.4 10*3/uL (ref 1.4–7.7)
Neutrophils Relative %: 61.4 % (ref 43.0–77.0)
RBC: 4.07 Mil/uL (ref 3.87–5.11)
WBC: 7.1 10*3/uL (ref 4.5–10.5)

## 2012-08-13 LAB — BASIC METABOLIC PANEL
BUN: 8 mg/dL (ref 6–23)
CO2: 30 mEq/L (ref 19–32)
Chloride: 102 mEq/L (ref 96–112)
Glucose, Bld: 77 mg/dL (ref 70–99)
Potassium: 4.5 mEq/L (ref 3.5–5.1)

## 2012-08-13 NOTE — Telephone Encounter (Signed)
Okay to work in at 12:45 

## 2012-08-13 NOTE — Progress Notes (Signed)
  Subjective:    Patient ID: Regina Mendez, female    DOB: Jul 26, 1960, 52 y.o.   MRN: [id number]  HPI [name] is a 52 year old married female who comes in today for evaluation of 2 problems  She's noticed for the last couple months she's having more pain in her fingers shoulders knees and hips. She has good range of motion. She exercises on a regular basis. She describes her pain as constant dull a 3 or 4 on a scale of 1-10. Last night she took 400 mg of Motrin and it seemed to help  Mother has a history of rheumatoid arthritis  She was seen in GYN who retired. She's on a hormonal patch daily. She's noticed it made her breasts a little bigger. She's overdue for a mammogram. She had a history of fibrocystic changes in the past   Review of Systems    review of systems otherwise negative Objective:   Physical Exam  Well-developed well-nourished female no acute distress examination of all her joints appears normal there is no swelling redness or palpable tenderness. She does have a nodule on her left index finger medially.  Bilateral breast exam normal except for multiple very dense fibrocystic changes      Assessment & Plan:  Joint pain check labs begin anti-inflammatory  History of severe fibrocystic changes bilaterally recommend 3-D mammogram

## 2012-08-13 NOTE — Patient Instructions (Addendum)
Call the breast Center and it set up for a mammogram and ask for 3 days  Labs today  We will call you the report in a couple days and we get everything back  Motrin 400 mg twice daily with food

## 2012-08-13 NOTE — Telephone Encounter (Signed)
Patient really wants to be worked in today. Pain all over. Thinks it's arthritis. Please advise re time.

## 2012-08-14 LAB — ANTI-NUCLEAR AB-TITER (ANA TITER): ANA Titer 1: NEGATIVE

## 2012-08-15 ENCOUNTER — Other Ambulatory Visit: Payer: Self-pay | Admitting: Family Medicine

## 2012-08-15 DIAGNOSIS — R899 Unspecified abnormal finding in specimens from other organs, systems and tissues: Secondary | ICD-10-CM

## 2012-08-20 ENCOUNTER — Other Ambulatory Visit (INDEPENDENT_AMBULATORY_CARE_PROVIDER_SITE_OTHER): Payer: BC Managed Care – PPO

## 2012-08-20 ENCOUNTER — Encounter: Payer: Self-pay | Admitting: Family Medicine

## 2012-08-20 ENCOUNTER — Telehealth: Payer: Self-pay | Admitting: Family Medicine

## 2012-08-20 DIAGNOSIS — R899 Unspecified abnormal finding in specimens from other organs, systems and tissues: Secondary | ICD-10-CM

## 2012-08-20 DIAGNOSIS — R5383 Other fatigue: Secondary | ICD-10-CM

## 2012-08-20 DIAGNOSIS — R5381 Other malaise: Secondary | ICD-10-CM

## 2012-08-20 LAB — TSH: TSH: 0.72 u[IU]/mL (ref 0.35–5.50)

## 2012-08-20 NOTE — Telephone Encounter (Signed)
Spoke with patient and she will come for lab appointment and then when the results come back we will talk again

## 2012-08-20 NOTE — Addendum Note (Signed)
Addended by: Rita Ohara R on: 08/20/2012 11:40 AM   Modules accepted: Orders

## 2012-08-20 NOTE — Telephone Encounter (Signed)
Pt requesting a call back from Dr. Nelida Meuse CMA to discuss the following:  1. Results of labs she had drawn last week  2. Determine who she should see during Dr. Nelida Meuse absence regarding her complicated symptoms(generalized weakness). Patient does not want to see nurse practitioner.

## 2012-08-21 ENCOUNTER — Encounter: Payer: Self-pay | Admitting: Family Medicine

## 2012-08-21 ENCOUNTER — Ambulatory Visit (INDEPENDENT_AMBULATORY_CARE_PROVIDER_SITE_OTHER): Payer: BC Managed Care – PPO | Admitting: Family Medicine

## 2012-08-21 VITALS — BP 98/60 | HR 62 | Temp 98.0°F | Wt 126.0 lb

## 2012-08-21 DIAGNOSIS — M2550 Pain in unspecified joint: Secondary | ICD-10-CM

## 2012-08-21 DIAGNOSIS — R5383 Other fatigue: Secondary | ICD-10-CM

## 2012-08-21 DIAGNOSIS — R5381 Other malaise: Secondary | ICD-10-CM

## 2012-08-21 NOTE — Progress Notes (Signed)
Subjective:    Patient ID: Regina Mendez, female    DOB: 11/23/1960, 51 y.o.   MRN: [id number]  HPI Patient seen with 4-5 week history of polyarthralgias and some myalgias. Possibly some increased fatigue over baseline. Mother has positive rheumatoid factor but not clearly rheumatoid arthritis. Patient is not noted any inflammatory changes such as swelling, redness, or warmth. Multiple joints involved including hands, wrists, elbows, shoulders, knees, hips, ankles She's taken occasional anti-inflammatory with some improvement. Stays quite active and still exercising.  Denies any recent fever or chills. No appetite or weight changes. No rash. No history of recent tick bites. She had multiple recent labs including normal sedimentation rate, positive ANA but no significant titer elevation. Rheumatoid factor XV which is minimally elevated but value of 18 one year ago. CCP antibody pending  Regarding her fatigue, recent TSH normal She feels she is getting adequate sleep  Past Medical History  Diagnosis Date  . Breast cyst   . Acne   . Migraine headache   . Premature menopause   . Other and unspecified ovarian cysts   . Cervical dysplasia    Past Surgical History  Procedure Laterality Date  . Tonsillectomy    . Appendectomy    . Breast surgery      right breast bx  . Colposcopy    . Gynecologic cryosurgery    . Ovarian cyst surgery      reports that she has never smoked. She does not have any smokeless tobacco history on file. She reports that she drinks about 0.5 ounces of alcohol per week. She reports that she does not use illicit drugs. family history includes Breast cancer in her cousin; Heart disease in her father; Hypertension in her father; and Thyroid cancer in her mother. Allergies  Allergen Reactions  . Codeine   . Sulfonamide Derivatives       Review of Systems  Constitutional: Positive for fatigue. Negative for fever, chills, activity change, appetite change  and unexpected weight change.  HENT: Negative for sore throat and voice change.   Eyes: Negative for visual disturbance.  Gastrointestinal: Negative for nausea, vomiting, abdominal pain, diarrhea and constipation.  Endocrine: Negative for cold intolerance, heat intolerance, polydipsia and polyuria.  Musculoskeletal: Positive for myalgias and arthralgias.  Skin: Negative for rash.  Neurological: Negative for headaches.  Hematological: Negative for adenopathy.       Objective:   Physical Exam  Constitutional: She appears well-developed and well-nourished.  HENT:  Mouth/Throat: Oropharynx is clear and moist.  Neck: Neck supple. No thyromegaly present.  Cardiovascular: Normal rate and regular rhythm.   Pulmonary/Chest: Effort normal and breath sounds normal. No respiratory distress. She has no wheezes. She has no rales.  Musculoskeletal: She exhibits no edema.  Joints do not reveal any erythema, edema, or warmth.  Lymphadenopathy:    She has no cervical adenopathy.  Skin: No rash noted.  Psychiatric: She has a normal mood and affect. Her behavior is normal.          Assessment & Plan:  Patient complains of fatigue and polyarthralgias. No objective evidence for inflammatory arthritis. Suspect for minimally elevated rheumatoid factor and positive ANA are nonspecific. CCP antibodies pending. She is reluctant to take medications at this time. We discussed other possibilities such as post viral inflammatory changes. Consider rheumatology consult if symptoms progress or certainly if CCP antibodies positive.  Likelihood of things like Lyme disease are very slim given the fact she has not been to endemic area and does  not report any recent tick bite or suspicious rash

## 2012-08-21 NOTE — Patient Instructions (Addendum)
Follow up for any joint swelling, increased redness, or warmth.

## 2012-08-24 ENCOUNTER — Ambulatory Visit (INDEPENDENT_AMBULATORY_CARE_PROVIDER_SITE_OTHER): Payer: BC Managed Care – PPO | Admitting: Family Medicine

## 2012-08-24 ENCOUNTER — Encounter: Payer: Self-pay | Admitting: Family Medicine

## 2012-08-24 VITALS — BP 104/70 | HR 100 | Temp 97.7°F

## 2012-08-24 DIAGNOSIS — IMO0001 Reserved for inherently not codable concepts without codable children: Secondary | ICD-10-CM

## 2012-08-24 DIAGNOSIS — M791 Myalgia, unspecified site: Secondary | ICD-10-CM

## 2012-08-24 DIAGNOSIS — G43909 Migraine, unspecified, not intractable, without status migrainosus: Secondary | ICD-10-CM

## 2012-08-24 DIAGNOSIS — M2550 Pain in unspecified joint: Secondary | ICD-10-CM

## 2012-08-24 NOTE — Progress Notes (Signed)
  Subjective:    Patient ID: Regina Mendez, female    DOB: 10/03/1960, 52 y.o.   MRN: [id number]  HPI Patient worked in for the following:  Migraine type headache yesterday. Left-sided.  Throbbing quality. No nausea. Mild photophobia. Typical of previous migraines. She took Advil and headache has resolved today.  Ongoing myalgias and arthralgias for the past month. No objective inflammatory changes. Refer to prior note. Recent blood work including CCP antibodies unremarkable. She had nonspecific minimally positive for elevated rheumatoid factor which she's had the past. Suspect this was a false positive. She is still staying quite active. She has never had inflammatory changes such as visible edema, redness, or joint warmth. No tick bites. No unusual rashes. We suspect that she may have some type of post viral changes. She does have some frequent myalgias. She does not take consistent vitamin D. No history of vitamin D levels.  Past Medical History  Diagnosis Date  . Breast cyst   . Acne   . Migraine headache   . Premature menopause   . Other and unspecified ovarian cysts   . Cervical dysplasia    Past Surgical History  Procedure Laterality Date  . Tonsillectomy    . Appendectomy    . Breast surgery      right breast bx  . Colposcopy    . Gynecologic cryosurgery    . Ovarian cyst surgery      reports that she has never smoked. She does not have any smokeless tobacco history on file. She reports that she drinks about 0.5 ounces of alcohol per week. She reports that she does not use illicit drugs. family history includes Breast cancer in her cousin; Heart disease in her father; Hypertension in her father; and Thyroid cancer in her mother. Allergies  Allergen Reactions  . Codeine   . Sulfonamide Derivatives       Review of Systems  Constitutional: Positive for fatigue. Negative for fever, chills, appetite change and unexpected weight change.  Respiratory: Negative for cough and  shortness of breath.   Cardiovascular: Negative for chest pain.  Musculoskeletal: Positive for myalgias and arthralgias.  Skin: Negative for rash.  Hematological: Negative for adenopathy.       Objective:   Physical Exam  Constitutional: She is oriented to person, place, and time. She appears well-developed and well-nourished.  Cardiovascular: Normal rate and regular rhythm.   Pulmonary/Chest: Breath sounds normal. No respiratory distress. She has no wheezes. She has no rales.  Musculoskeletal: She exhibits no edema.  Joints are again examined and no evidence for inflammation, warmth, or erythema. She has full range of motion all joints  Neurological: She is alert and oriented to person, place, and time. No cranial nerve deficit.          Assessment & Plan:  #1 migraine headache. Resolved. Continue over-the-counter Advil which seems to work adequately. #2 myalgias and arthralgias. Recent lab work unremarkable.  Symptoms are relatively mild. Check 25-hydroxy vitamin D level. We discussed possible rheumatology referral and she was given names of a couple rheumatologists if her symptoms persist

## 2012-08-25 LAB — VITAMIN D 25 HYDROXY (VIT D DEFICIENCY, FRACTURES): Vit D, 25-Hydroxy: 34 ng/mL (ref 30–89)

## 2012-08-28 ENCOUNTER — Other Ambulatory Visit: Payer: Self-pay | Admitting: Obstetrics and Gynecology

## 2012-09-28 ENCOUNTER — Other Ambulatory Visit: Payer: BC Managed Care – PPO

## 2012-10-03 ENCOUNTER — Encounter: Payer: Self-pay | Admitting: Gynecology

## 2012-10-08 ENCOUNTER — Encounter: Payer: BC Managed Care – PPO | Admitting: Family Medicine

## 2012-10-26 ENCOUNTER — Encounter: Payer: Self-pay | Admitting: Internal Medicine

## 2012-10-26 ENCOUNTER — Ambulatory Visit (INDEPENDENT_AMBULATORY_CARE_PROVIDER_SITE_OTHER): Payer: BC Managed Care – PPO | Admitting: Internal Medicine

## 2012-10-26 VITALS — BP 112/70 | Temp 98.9°F | Ht 65.0 in | Wt 127.0 lb

## 2012-10-26 DIAGNOSIS — G4700 Insomnia, unspecified: Secondary | ICD-10-CM

## 2012-10-26 DIAGNOSIS — F5104 Psychophysiologic insomnia: Secondary | ICD-10-CM

## 2012-10-26 DIAGNOSIS — Z23 Encounter for immunization: Secondary | ICD-10-CM

## 2012-10-26 LAB — TSH: TSH: 0.39 u[IU]/mL (ref 0.35–5.50)

## 2012-10-26 MED ORDER — AMITRIPTYLINE HCL 10 MG PO TABS: 5.0000 mg | ORAL_TABLET | Freq: Every evening | ORAL | Status: DC | PRN

## 2012-10-26 NOTE — Patient Instructions (Signed)
You can also try low dose OTC melatonin (Natrol 3 mg - 1/2 tab 1 hour before bedtime) dissolving tablet Follow sleep hygiene changes as directed.

## 2012-10-26 NOTE — Progress Notes (Signed)
Subjective:    Patient ID: Regina Mendez, female    DOB: May 14, 1960, 52 y.o.   MRN: [id number]  HPI  52 year old female with history of migraine headache and menopausal symptoms complains of intermittent insomnia. Her symptoms have been ongoing for the last 1-1/2 years. She is able to fall sleep but has difficulty staying asleep. She denies any tobacco or alcohol use. 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 She is also helping in taking care of her grandchildren.  She is followed by therapist who recommended possibly something for anxiety.  When she doesn't get enough sleep patient reports increase in muscle tension. She is using homeopathic supplement for sleep.   Review of Systems Negative for depressive symptoms, negative for significant weight change    Past Medical History  Diagnosis Date  . Breast cyst   . Acne   . Migraine headache   . Premature menopause   . Other and unspecified ovarian cysts   . Cervical dysplasia     History   Social History  . Marital Status: Married    Spouse Name: N/A    Number of Children: N/A  . Years of Education: N/A   Occupational History  . Not on file.   Social History Main Topics  . Smoking status: Never Smoker   . Smokeless tobacco: Not on file  . Alcohol Use: 0.5 oz/week    1 drink(s) per week  . Drug Use: No  . Sexual Activity: Yes    Birth Control/ Protection: Post-menopausal   Other Topics Concern  . Not on file   Social History Narrative  . No narrative on file    Past Surgical History  Procedure Laterality Date  . Tonsillectomy    . Appendectomy    . Breast surgery      right breast bx  . Colposcopy    . Gynecologic cryosurgery    . Ovarian cyst surgery      Family History  Problem Relation Age of Onset  . Thyroid cancer Mother   . Hypertension Father   . Heart disease Father   . Breast cancer Cousin     Mat. 1st cousins X 2     Age 66's    Allergies  Allergen  Reactions  . Codeine   . Sulfonamide Derivatives     Current Outpatient Prescriptions on File Prior to Visit  Medication Sig Dispense Refill  . albuterol (PROVENTIL HFA;VENTOLIN HFA) 108 (90 BASE) MCG/ACT inhaler Inhale 2 puffs into the lungs every 6 (six) hours as needed.        . ALPRAZolam (XANAX) 0.5 MG tablet 1 tab 1 hour prior to flying  10 tablet  0  . aspirin 81 MG tablet Take 81 mg by mouth daily.        . beclomethasone (QVAR) 40 MCG/ACT inhaler Inhale 2 puffs into the lungs 2 (two) times daily.        Marland Kitchen BIOTIN PO Take by mouth.      . estradiol (CLIMARA - DOSED IN MG/24 HR) 0.1 mg/24hr PLACE 1 PATCH ON TO THE SKIN ONCE A WEEK  12 patch  0  . Misc Natural Products (DEEP SLEEP PO) Take by mouth.      . Multiple Vitamins-Minerals (HAIR/SKIN/NAILS PO) Take by mouth.      . NON FORMULARY Clindamycin phosphate 1 % solution        . progesterone (PROMETRIUM) 200 MG capsule USE AS DIRECTED DAYS 1-12  36  capsule  0   No current facility-administered medications on file prior to visit.    BP 112/70  Temp(Src) 98.9 F (37.2 C) (Oral)  Ht 5\' 5"  (1.651 m)  Wt 127 lb (57.607 kg)  BMI 21.13 kg/m2    Objective:   Physical Exam  Constitutional: She is oriented to person, place, and time. She appears well-developed and well-nourished.  Cardiovascular: Normal rate, regular rhythm and normal heart sounds.   No murmur heard. Pulmonary/Chest: Effort normal and breath sounds normal. She has no wheezes.  Neurological: She is alert and oriented to person, place, and time. No cranial nerve deficit.          Assessment & Plan:

## 2012-10-26 NOTE — Assessment & Plan Note (Signed)
Patient has intermittent insomnia. She has no difficulty getting to sleep but has issues staying asleep. Reviewed maintaining good sleep hygiene. She would like to avoid sedatives. Trial of low-dose amitriptyline 5 mg at bedtime. We discussed common side effects.  It may also help with her hx of migraines.  Follow up with PCP as directed.

## 2012-11-09 ENCOUNTER — Encounter: Payer: Self-pay | Admitting: Gynecology

## 2012-11-14 ENCOUNTER — Other Ambulatory Visit: Payer: Self-pay

## 2012-11-14 DIAGNOSIS — Z1231 Encounter for screening mammogram for malignant neoplasm of breast: Secondary | ICD-10-CM

## 2012-11-19 ENCOUNTER — Other Ambulatory Visit: Payer: BC Managed Care – PPO

## 2012-11-19 ENCOUNTER — Telehealth: Payer: Self-pay | Admitting: Family Medicine

## 2012-11-19 DIAGNOSIS — N6002 Solitary cyst of left breast: Secondary | ICD-10-CM

## 2012-11-19 NOTE — Telephone Encounter (Signed)
Pt called and stated that the soonest the breast center could schedule her a 3D ultrasound, was not for months. She would like Dr. Tawanna Cooler to place a referral so that she can be seen sooner, she has two lumps in her left breast. Please assist.

## 2012-11-20 ENCOUNTER — Other Ambulatory Visit (INDEPENDENT_AMBULATORY_CARE_PROVIDER_SITE_OTHER): Payer: BC Managed Care – PPO

## 2012-11-20 DIAGNOSIS — Z0000 Encounter for general adult medical examination without abnormal findings: Secondary | ICD-10-CM

## 2012-11-20 LAB — CBC WITH DIFFERENTIAL/PLATELET
Basophils Absolute: 0 10*3/uL (ref 0.0–0.1)
Basophils Relative: 0.5 % (ref 0.0–3.0)
Eosinophils Absolute: 0.3 10*3/uL (ref 0.0–0.7)
Eosinophils Relative: 5 % (ref 0.0–5.0)
HCT: 39.1 % (ref 36.0–46.0)
Hemoglobin: 13.1 g/dL (ref 12.0–15.0)
Lymphocytes Relative: 29.7 % (ref 12.0–46.0)
Lymphs Abs: 1.8 10*3/uL (ref 0.7–4.0)
MCHC: 33.6 g/dL (ref 30.0–36.0)
MCV: 94.9 fl (ref 78.0–100.0)
Monocytes Absolute: 0.8 10*3/uL (ref 0.1–1.0)
Monocytes Relative: 12.7 % — ABNORMAL HIGH (ref 3.0–12.0)
Neutro Abs: 3.2 10*3/uL (ref 1.4–7.7)
Neutrophils Relative %: 52.1 % (ref 43.0–77.0)
Platelets: 275 10*3/uL (ref 150.0–400.0)
RBC: 4.12 Mil/uL (ref 3.87–5.11)
RDW: 13.1 % (ref 11.5–14.6)
WBC: 6.2 10*3/uL (ref 4.5–10.5)

## 2012-11-20 LAB — HEPATIC FUNCTION PANEL
Alkaline Phosphatase: 39 U/L (ref 39–117)
Bilirubin, Direct: 0 mg/dL (ref 0.0–0.3)
Total Bilirubin: 0.7 mg/dL (ref 0.3–1.2)

## 2012-11-20 LAB — LIPID PANEL
Cholesterol: 177 mg/dL (ref 0–200)
HDL: 42.7 mg/dL (ref >39.00)
LDL Cholesterol: 112 mg/dL — ABNORMAL HIGH (ref 0–99)
Total CHOL/HDL Ratio: 4
Triglycerides: 110 mg/dL (ref 0.0–149.0)
VLDL: 22 mg/dL (ref 0.0–40.0)

## 2012-11-20 LAB — POCT URINALYSIS DIPSTICK
Bilirubin, UA: NEGATIVE
Glucose, UA: NEGATIVE
Ketones, UA: NEGATIVE
Leukocytes, UA: NEGATIVE
Nitrite, UA: NEGATIVE
Protein, UA: NEGATIVE
Spec Grav, UA: 1.015
Urobilinogen, UA: 0.2
pH, UA: 6.5

## 2012-11-20 LAB — BASIC METABOLIC PANEL
BUN: 9 mg/dL (ref 6–23)
CO2: 29 mEq/L (ref 19–32)
Chloride: 103 mEq/L (ref 96–112)
Creatinine, Ser: 0.7 mg/dL (ref 0.4–1.2)
Glucose, Bld: 93 mg/dL (ref 70–99)
Potassium: 4.9 mEq/L (ref 3.5–5.1)
Sodium: 139 mEq/L (ref 135–145)

## 2012-11-20 NOTE — Telephone Encounter (Signed)
Order placed

## 2012-11-22 ENCOUNTER — Telehealth: Payer: Self-pay | Admitting: Family Medicine

## 2012-11-22 DIAGNOSIS — N6320 Unspecified lump in the left breast, unspecified quadrant: Secondary | ICD-10-CM

## 2012-11-22 NOTE — Telephone Encounter (Signed)
Pt needs diagnostic mamm and ultrasound. Pt very concerned due to the 2 lumps in her breast. Can you pls put order in? The order placed was put in wrong.

## 2012-11-26 ENCOUNTER — Ambulatory Visit (INDEPENDENT_AMBULATORY_CARE_PROVIDER_SITE_OTHER): Payer: BC Managed Care – PPO | Admitting: Family Medicine

## 2012-11-26 ENCOUNTER — Other Ambulatory Visit: Payer: Self-pay | Admitting: Family Medicine

## 2012-11-26 ENCOUNTER — Encounter: Payer: Self-pay | Admitting: Family Medicine

## 2012-11-26 VITALS — BP 102/68 | Temp 98.4°F | Ht 66.0 in | Wt 127.0 lb

## 2012-11-26 DIAGNOSIS — G4700 Insomnia, unspecified: Secondary | ICD-10-CM

## 2012-11-26 DIAGNOSIS — G43909 Migraine, unspecified, not intractable, without status migrainosus: Secondary | ICD-10-CM

## 2012-11-26 DIAGNOSIS — J309 Allergic rhinitis, unspecified: Secondary | ICD-10-CM

## 2012-11-26 DIAGNOSIS — N63 Unspecified lump in unspecified breast: Secondary | ICD-10-CM

## 2012-11-26 DIAGNOSIS — N6320 Unspecified lump in the left breast, unspecified quadrant: Secondary | ICD-10-CM

## 2012-11-26 DIAGNOSIS — F5104 Psychophysiologic insomnia: Secondary | ICD-10-CM

## 2012-11-26 DIAGNOSIS — N951 Menopausal and female climacteric states: Secondary | ICD-10-CM

## 2012-11-26 DIAGNOSIS — J45909 Unspecified asthma, uncomplicated: Secondary | ICD-10-CM

## 2012-11-26 MED ORDER — AMITRIPTYLINE HCL 10 MG PO TABS: 5.0000 mg | ORAL_TABLET | Freq: Every evening | ORAL | Status: DC | PRN

## 2012-11-26 NOTE — Progress Notes (Signed)
  Subjective:    Patient ID: Regina Mendez, female    DOB: 1960/06/26, 52 y.o.   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 Artist Pais. He gave her some Elavil which she didn't fill because she looked at the label and was labeled as an antidepressant  She continues on hormonal patches via GYN  She has fibrocystic breast changes in both breasts and she's due for a mammogram.  Vaccinations up-to-date   Review of Systems  Constitutional: Negative.   HENT: Negative.   Eyes: Negative.   Respiratory: Negative.   Cardiovascular: Negative.   Gastrointestinal: Negative.   Endocrine: Negative.   Genitourinary: Negative.   Musculoskeletal: Negative.   Allergic/Immunologic: Negative.   Neurological: Negative.   Hematological: Negative.   Psychiatric/Behavioral: Negative.        Objective:   Physical Exam  Nursing note and vitals reviewed. Constitutional: She appears well-developed and well-nourished.  HENT:  Head: Normocephalic and atraumatic.  Right Ear: External ear normal.  Left Ear: External ear normal.  Nose: Nose normal.  Mouth/Throat: Oropharynx is clear and moist.  Eyes: EOM are normal. Pupils are equal, round, and reactive to light.  Neck: Normal range of motion. Neck supple. No thyromegaly present.  Cardiovascular: Normal rate, regular rhythm, normal heart sounds and intact distal pulses.  Exam reveals no gallop and no friction rub.   No murmur heard. Pulmonary/Chest: Effort normal and breath sounds normal.  Abdominal: Soft. Bowel sounds are normal. She exhibits no distension and no mass. There is no tenderness. There is no rebound.  Genitourinary:  Bilateral breast exam shows multiple fibrocystic changes throughout both breasts. They're all soft rubbery movable.   Musculoskeletal: Normal range of motion.  Lymphadenopathy:    She has no cervical adenopathy.  Neurological: She is alert. She has normal reflexes. No cranial nerve deficit. She exhibits normal muscle tone. Coordination normal.  Skin: Skin is warm and dry.  Psychiatric: She has a normal mood and affect. Her behavior is normal. Judgment and thought content normal.          Assessment & Plan:  Healthy female  Insomnia trial of Elavil 10 mg each bedtime: 6 weeks with progress report  Occasional asthma albuterol and Qvar when necessary  Postmenopausal changes early menopause continue GYN followup  Fibrocystic breast changes recommend BSE monthly and annual mammography

## 2012-11-26 NOTE — Patient Instructions (Signed)
Take the Elavil 10 mg, one tablet nightly at bedtime  6 if in 6 weeks you're feeling well continue that dose  If in 6 weeks you're not then call and leave a voicemail with Fleet Contras and I will call you back  And BSE monthly as outlined  And you mammography  Albuterol and Qvar when necessary

## 2012-11-28 ENCOUNTER — Other Ambulatory Visit: Payer: Self-pay | Admitting: Family Medicine

## 2012-11-28 ENCOUNTER — Other Ambulatory Visit: Payer: Self-pay

## 2012-11-28 DIAGNOSIS — N6320 Unspecified lump in the left breast, unspecified quadrant: Secondary | ICD-10-CM

## 2012-11-29 ENCOUNTER — Ambulatory Visit
Admission: RE | Admit: 2012-11-29 | Discharge: 2012-11-29 | Disposition: A | Discharge status: Home / Self Care | Payer: BC Managed Care – PPO | Source: Ambulatory Visit | Attending: Family Medicine | Admitting: Family Medicine

## 2012-11-29 ENCOUNTER — Other Ambulatory Visit: Payer: Self-pay | Admitting: Family Medicine

## 2012-11-29 DIAGNOSIS — N6320 Unspecified lump in the left breast, unspecified quadrant: Secondary | ICD-10-CM

## 2012-12-11 ENCOUNTER — Ambulatory Visit: Payer: BC Managed Care – PPO

## 2012-12-14 ENCOUNTER — Encounter: Payer: Self-pay | Admitting: Gynecology

## 2012-12-14 ENCOUNTER — Ambulatory Visit (INDEPENDENT_AMBULATORY_CARE_PROVIDER_SITE_OTHER): Payer: BC Managed Care – PPO | Admitting: Gynecology

## 2012-12-14 VITALS — BP 112/66 | Ht 65.0 in | Wt 126.0 lb

## 2012-12-14 DIAGNOSIS — Z79890 Hormone replacement therapy (postmenopausal): Secondary | ICD-10-CM

## 2012-12-14 DIAGNOSIS — Z01419 Encounter for gynecological examination (general) (routine) without abnormal findings: Secondary | ICD-10-CM

## 2012-12-14 MED ORDER — PROGESTERONE MICRONIZED 100 MG PO CAPS: 100.0000 mg | ORAL_CAPSULE | Freq: Every day | ORAL | Status: DC

## 2012-12-14 MED ORDER — ESTRADIOL 0.1 MG/24HR TD PTWK: MEDICATED_PATCH | TRANSDERMAL | Status: DC

## 2012-12-14 NOTE — Progress Notes (Signed)
Regina Mendez [birth year] [id number]        52 y.o.  [id number] for annual exam.  Former patient of Dr. Eda Paschal. Several issues noted below.  Past medical history,surgical history, problem list, medications, allergies, family history and social history were all reviewed and documented in the EPIC chart.  ROS:  Performed and pertinent positives and negatives are included in the history, assessment and plan .  Exam: Kim assistant Filed Vitals:   12/14/12 1213  BP: 112/66  Height: 5\' 5"  (1.651 m)  Weight: 126 lb (57.153 kg)   General appearance  Normal Skin grossly normal Head/Neck normal with no cervical or supraclavicular adenopathy thyroid normal Lungs  clear Cardiac RR, without RMG Abdominal  soft, nontender, without masses, organomegaly or hernia Breasts  examined lying and sitting. Extremely dense bilaterally without dominant masses, retractions, discharge or axillary adenopathy. Pelvic  Ext/BUS/vagina  normal with menses type flow  Cervix  normal with menses type flow  Uterus  retroverted, normal size, shape and contour, midline and mobile nontender   Adnexa  Without masses or tenderness    Anus and perineum  normal   Rectovaginal  normal sphincter tone without palpated masses or tenderness.    Assessment/Plan:  52 y.o. [id number] female for annual exam.   1. Postmenopausal/menopausal symptoms. Patient is on HRT of Climara 0.1 mg and Prometrium 200 mg first 12 days each month. Underwent menopause mid 76s and has been on HRT since then. No bleeding in between but does have a withdrawal bleed each month.  I reviewed the whole issue of HRT with her to include the WHI study with increased risk of stroke, heart attack, DVT and breast cancer. The ACOG and NAMS statements for lowest dose for the shortest period of time reviewed. Transdermal versus oral first-pass effect benefit discussed.  Patient wants to continue her HRT at least at this point which I think is certainly reasonable. I  reviewed alternatives to include a continuous progesterone to hopefully eliminate monthly menses and she agrees with this. She'll continue on the Climara patch and will switch her to Prometrium 100 mg nightly. She'll call me if she does any bleeding once she gets started on the new regimen. 2. Dense breasts with history of bilateral cysts. Recent mammography and ultrasound negative10/2014 . Recommended 3-D mammography when she gets her next mammogram. Does have history of breast cancer at younger ages in 2 maternal cousins. Is in the process of contacting them to see if they were genetically tested. I discussed testing the patient was BRCA screening if the cousins were not tested or decline testing. Patient will followup if she desires screening. SBE reviewed. 3. Pap smear 2013. No Pap smear done today. History of cryosurgery when she was in her late 37s. Normal Pap smears since then. Plan repeat Pap smear 3 year interval. 4. DEXA 2010 normal. Recommended repeat a 10 year interval. Increase calcium vitamin D reviewed. 5. Colonoscopy 2012. Repeat at their recommended interval. 6. Health maintenance. No lab work done as it is all done through her other physician's offices. Followup one year, sooner as needed.  Note: This document was prepared with digital dictation and possible smart phrase technology. Any transcriptional errors that result from this process are unintentional.   Dara Lords MD, 12:45 PM 12/14/2012

## 2012-12-14 NOTE — Patient Instructions (Signed)
Follow up in one year, sooner as needed. 

## 2012-12-17 ENCOUNTER — Other Ambulatory Visit: Payer: Self-pay

## 2012-12-17 MED ORDER — ESTRADIOL 0.1 MG/24HR TD PTWK: MEDICATED_PATCH | TRANSDERMAL | Status: DC

## 2013-06-12 ENCOUNTER — Ambulatory Visit (INDEPENDENT_AMBULATORY_CARE_PROVIDER_SITE_OTHER): Payer: BC Managed Care – PPO | Admitting: Family Medicine

## 2013-06-12 ENCOUNTER — Encounter: Payer: Self-pay | Admitting: Family Medicine

## 2013-06-12 VITALS — BP 95/54 | HR 79 | Temp 98.7°F | Ht 65.0 in | Wt 124.0 lb

## 2013-06-12 DIAGNOSIS — S838X9A Sprain of other specified parts of unspecified knee, initial encounter: Secondary | ICD-10-CM

## 2013-06-12 DIAGNOSIS — S86819A Strain of other muscle(s) and tendon(s) at lower leg level, unspecified leg, initial encounter: Secondary | ICD-10-CM

## 2013-06-12 DIAGNOSIS — S86919A Strain of unspecified muscle(s) and tendon(s) at lower leg level, unspecified leg, initial encounter: Secondary | ICD-10-CM

## 2013-06-12 NOTE — Progress Notes (Signed)
Pre visit review using our clinic review tool, if applicable. No additional management support is needed unless otherwise documented below in the visit note. 

## 2013-06-14 NOTE — Progress Notes (Signed)
   Subjective:    Patient ID: Regina Mendez, female    DOB: 1960/10/14, 53 y.o.   MRN: [id number]  HPI Here for 3 weeks of mild pains in the backs of both knees after doing some extreme lunges and squats one day as part of her yoga workout. She has been doing yoga for years but has never worked on her legs this intensely. The next days she had the pain in the knees and it has persisted since then. She has continues to do yoga without the lunges. No swelling. Using Advil some.    Review of Systems  Constitutional: Negative.   Musculoskeletal: Positive for arthralgias. Negative for gait problem and joint swelling.       Objective:   Physical Exam  Constitutional: She appears well-developed and well-nourished. No distress.  Musculoskeletal:  Both knees are unremarkable on exam          Assessment & Plan:  She seems to have strained the knees, and I told her this should resolve if she rested the knees. She can walk for exercise but I advised her to avoid any yoga and any squatting or lunging for at least a month to allow them to heal. Recheck prn

## 2013-09-17 ENCOUNTER — Ambulatory Visit: Payer: BC Managed Care – PPO | Admitting: Gynecology

## 2013-09-18 ENCOUNTER — Ambulatory Visit (INDEPENDENT_AMBULATORY_CARE_PROVIDER_SITE_OTHER): Payer: BC Managed Care – PPO | Admitting: Family Medicine

## 2013-09-18 ENCOUNTER — Other Ambulatory Visit: Payer: Self-pay | Admitting: Family Medicine

## 2013-09-18 ENCOUNTER — Encounter: Payer: Self-pay | Admitting: Family Medicine

## 2013-09-18 VITALS — BP 110/70 | Temp 98.0°F | Wt 127.0 lb

## 2013-09-18 DIAGNOSIS — N63 Unspecified lump in unspecified breast: Secondary | ICD-10-CM

## 2013-09-18 DIAGNOSIS — M2550 Pain in unspecified joint: Secondary | ICD-10-CM

## 2013-09-18 DIAGNOSIS — Z23 Encounter for immunization: Secondary | ICD-10-CM

## 2013-09-18 NOTE — Progress Notes (Signed)
   Subjective:    Patient ID: Regina Mendez, female    DOB: March 03, 1960, 53 y.o.   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 Loetta Rough. She takes the Climara patch once weekly and Prometrium 100 mg daily  She has a history of fibrocystic changes. Last mammogram was in October. She also had an ultrasound because of the multiple cysts. She states over the weekend she felt some soreness and noticed 2 new cysts in her right breast.  She's also complaining of leg pain. She said she was seen here for this problem and no diagnosis was given. She points the posterior portion of each knee as a source for pain. She states it comes and goes no history of trauma or knee swelling. She did her yoga this morning without any discomfort   Review of Systems Review of systems otherwise negative    Objective:   Physical Exam  well-developed well-nourished female no acute distress vital signs stable she is afebrile examination of both breasts shows multiple fibrocystic changes. Specifically the right there is a lesion at 11:00 and a lesion at 9:00. She states these are the areas are now very sore. She's not sure if they're new.  Examination lower extremities the legs are normal in appearance skin normal pulses normal knee exam normal no palpable tenderness ligaments cartilage intact.       Assessment & Plan:  Fibrocystic breast changes.............. stimulated by daily HRT........... ultrasound to rule out malignancy  Bilateral knee pain,,,,,,,,,,, Motrin 400 twice a day when necessary,.

## 2013-09-18 NOTE — Patient Instructions (Addendum)
We will get you set up for an ultrasound of your breast  Motrin 400 mg twice daily with food when necessary for leg pain

## 2013-09-18 NOTE — Progress Notes (Signed)
Pre visit review using our clinic review tool, if applicable. No additional management support is needed unless otherwise documented below in the visit note. 

## 2013-09-20 ENCOUNTER — Other Ambulatory Visit: Payer: Self-pay | Admitting: Family Medicine

## 2013-09-20 ENCOUNTER — Ambulatory Visit: Payer: BC Managed Care – PPO

## 2013-09-20 ENCOUNTER — Ambulatory Visit
Admission: RE | Admit: 2013-09-20 | Discharge: 2013-09-20 | Disposition: A | Discharge status: Home / Self Care | Payer: BC Managed Care – PPO | Source: Ambulatory Visit | Attending: Family Medicine | Admitting: Family Medicine

## 2013-09-20 DIAGNOSIS — N63 Unspecified lump in unspecified breast: Secondary | ICD-10-CM

## 2013-11-28 ENCOUNTER — Ambulatory Visit: Payer: BC Managed Care – PPO | Admitting: Family Medicine

## 2013-12-02 ENCOUNTER — Encounter: Payer: Self-pay | Admitting: Family Medicine

## 2013-12-20 ENCOUNTER — Other Ambulatory Visit: Payer: Self-pay | Admitting: Gynecology

## 2013-12-20 MED ORDER — PROGESTERONE MICRONIZED 100 MG PO CAPS: 100.0000 mg | ORAL_CAPSULE | Freq: Every day | ORAL | Status: DC

## 2013-12-20 NOTE — Telephone Encounter (Signed)
Pt called asking for RF for HRT.  Going [place] on [place].  Reports she called the office and was told she needed to make her appointment (which she did) and that her prescriptions would be called in for her.  They were not and she is leaving on Monday.  Asked if I could do this for her until she has appt.  Confirmed medication, dosages, no medical issues or contraindications.  Also confirmed pt does have appt in Dec.  Rx done electronically to [facility].  Pt very appreciative.

## 2014-01-09 ENCOUNTER — Encounter: Payer: BC Managed Care – PPO | Admitting: Family Medicine

## 2014-01-09 ENCOUNTER — Other Ambulatory Visit (INDEPENDENT_AMBULATORY_CARE_PROVIDER_SITE_OTHER): Payer: BC Managed Care – PPO

## 2014-01-09 DIAGNOSIS — Z0000 Encounter for general adult medical examination without abnormal findings: Secondary | ICD-10-CM

## 2014-01-09 LAB — CBC WITH DIFFERENTIAL/PLATELET
BASOS ABS: 0 10*3/uL (ref 0.0–0.1)
Basophils Relative: 0.5 % (ref 0.0–3.0)
EOS ABS: 0.3 10*3/uL (ref 0.0–0.7)
Eosinophils Relative: 3.4 % (ref 0.0–5.0)
HEMATOCRIT: 41.4 % (ref 36.0–46.0)
Hemoglobin: 13.7 g/dL (ref 12.0–15.0)
LYMPHS ABS: 1.7 10*3/uL (ref 0.7–4.0)
Lymphocytes Relative: 22.4 % (ref 12.0–46.0)
MCHC: 33.1 g/dL (ref 30.0–36.0)
MCV: 95.5 fl (ref 78.0–100.0)
MONOS PCT: 11.4 % (ref 3.0–12.0)
Monocytes Absolute: 0.9 10*3/uL (ref 0.1–1.0)
Neutro Abs: 4.8 10*3/uL (ref 1.4–7.7)
Neutrophils Relative %: 62.3 % (ref 43.0–77.0)
Platelets: 276 10*3/uL (ref 150.0–400.0)
RBC: 4.34 Mil/uL (ref 3.87–5.11)
RDW: 13.6 % (ref 11.5–15.5)
WBC: 7.7 10*3/uL (ref 4.0–10.5)

## 2014-01-09 LAB — COMPREHENSIVE METABOLIC PANEL
ALT: 11 U/L (ref 0–35)
AST: 20 U/L (ref 0–37)
Albumin: 3.8 g/dL (ref 3.5–5.2)
Alkaline Phosphatase: 45 U/L (ref 39–117)
BUN: 12 mg/dL (ref 6–23)
CALCIUM: 8.4 mg/dL (ref 8.4–10.5)
CHLORIDE: 105 meq/L (ref 96–112)
CO2: 25 meq/L (ref 19–32)
CREATININE: 0.8 mg/dL (ref 0.4–1.2)
GFR: 82.19 mL/min (ref >60.00)
Glucose, Bld: 100 mg/dL — ABNORMAL HIGH (ref 70–99)
Potassium: 4.3 mEq/L (ref 3.5–5.1)
Sodium: 135 mEq/L (ref 135–145)
Total Bilirubin: 0.5 mg/dL (ref 0.2–1.2)
Total Protein: 6.7 g/dL (ref 6.0–8.3)

## 2014-01-09 LAB — LIPID PANEL
CHOL/HDL RATIO: 4
CHOLESTEROL: 182 mg/dL (ref 0–200)
HDL: 46.2 mg/dL (ref >39.00)
LDL CALC: 119 mg/dL — AB (ref 0–99)
NonHDL: 135.8
Triglycerides: 83 mg/dL (ref 0.0–149.0)
VLDL: 16.6 mg/dL (ref 0.0–40.0)

## 2014-01-09 LAB — POCT URINALYSIS DIPSTICK
Bilirubin, UA: NEGATIVE
Glucose, UA: NEGATIVE
KETONES UA: NEGATIVE
Leukocytes, UA: NEGATIVE
Nitrite, UA: NEGATIVE
PH UA: 6
PROTEIN UA: NEGATIVE
SPEC GRAV UA: 1.02
UROBILINOGEN UA: 0.2

## 2014-01-09 LAB — TSH: TSH: 0.94 u[IU]/mL (ref 0.35–4.50)

## 2014-01-14 ENCOUNTER — Encounter: Payer: Self-pay | Admitting: Family Medicine

## 2014-01-14 ENCOUNTER — Ambulatory Visit (INDEPENDENT_AMBULATORY_CARE_PROVIDER_SITE_OTHER): Payer: BC Managed Care – PPO | Admitting: Family Medicine

## 2014-01-14 VITALS — BP 110/68 | Temp 98.2°F | Ht 65.75 in | Wt 128.0 lb

## 2014-01-14 DIAGNOSIS — Z0000 Encounter for general adult medical examination without abnormal findings: Secondary | ICD-10-CM | POA: Insufficient documentation

## 2014-01-14 DIAGNOSIS — Z23 Encounter for immunization: Secondary | ICD-10-CM

## 2014-01-14 DIAGNOSIS — R21 Rash and other nonspecific skin eruption: Secondary | ICD-10-CM

## 2014-01-14 MED ORDER — CLINDAMYCIN PHOSPHATE 1 % EX SOLN: Freq: Two times a day (BID) | CUTANEOUS | Status: DC

## 2014-01-14 MED ORDER — TRETINOIN 0.05 % EX CREA: TOPICAL_CREAM | Freq: Every day | CUTANEOUS | Status: DC

## 2014-01-14 NOTE — Progress Notes (Signed)
   Subjective:    Patient ID: Regina Mendez, female    DOB: Aug 12, 1960, 53 y.o.   MRN: [id number]  HPI Regina Mendez is a 53 year old married female nonsmoker who comes in for general physical examination  We have her on a combination of Retin-A and Cleocin T because of her adult acne.  She continues on estrogen and progesterone under direction of Dr. Loetta Mendez. She is due to go to see him next week. She's wondering how long she should take the HRT. I deferred that question to him.  She gets routine eye care, dental care, BSE monthly,...... however she has multiple cysts in both breasts... Documented by mammography. Regina Mendez come back in 6 months for follow-up..... B February 2016  Showed a colonoscopy years ago and had colon polyps. Follow-up colonoscopy was negative. She's on a recall with Dr. May Mendez  Vaccinations up-to-date  Social history still difficulties with daughter who has anorexia at 80 years of age!!!!!!   Review of Systems  Constitutional: Negative.   HENT: Negative.   Eyes: Negative.   Respiratory: Negative.   Cardiovascular: Negative.   Gastrointestinal: Negative.   Endocrine: Negative.   Genitourinary: Negative.   Musculoskeletal: Negative.   Skin: Negative.   Allergic/Immunologic: Negative.   Neurological: Negative.   Hematological: Negative.   Psychiatric/Behavioral: Negative.        Objective:   Physical Exam  Constitutional: She appears well-developed and well-nourished.  HENT:  Head: Normocephalic and atraumatic.  Right Ear: External ear normal.  Left Ear: External ear normal.  Nose: Nose normal.  Mouth/Throat: Oropharynx is clear and moist.  Eyes: EOM are normal. Pupils are equal, round, and reactive to light.  Neck: Normal range of motion. Neck supple. No JVD present. No tracheal deviation present. No thyromegaly present.  Cardiovascular: Normal rate, regular rhythm, normal heart sounds and intact distal pulses.  Exam reveals no gallop and no friction rub.     No murmur heard. Pulmonary/Chest: Effort normal and breath sounds normal. No stridor. No respiratory distress. She has no wheezes. She has no rales. She exhibits no tenderness.  Abdominal: Soft. Bowel sounds are normal. She exhibits no distension and no mass. There is no tenderness. There is no rebound and no guarding.  Genitourinary:  Bilateral breast exam shows multiple cysts throughout both breasts the largest one in her left breast at 3:00 is a size of a golf ball  Musculoskeletal: Normal range of motion.  Lymphadenopathy:    She has no cervical adenopathy.  Neurological: She is alert. She has normal reflexes. No cranial nerve deficit. She exhibits normal muscle tone. Coordination normal.  Skin: Skin is warm and dry. No rash noted. No erythema. No pallor.  Psychiatric: She has a normal mood and affect. Her behavior is normal. Judgment and thought content normal.  Nursing note and vitals reviewed.         Assessment & Plan:  Healthy female  Adult acne...Marland KitchenMarland KitchenMarland Kitchen continue Cleocin T and Retin-A  Postmenopausal HRT view Dr. Loetta Mendez....... follow-up with him next week discuss tapering hormones  Extensive fibrocystic disease both breasts

## 2014-01-14 NOTE — Patient Instructions (Signed)
Retin-A........ apply bedtime and wash off in the morning  Cleocin T........ apply in the morning and wash off at bedtime  Discussed with Dr. Loetta Rough........... how to taper your HRT  Return in one year sooner if any problem

## 2014-01-14 NOTE — Progress Notes (Signed)
Pre visit review using our clinic review tool, if applicable. No additional management support is needed unless otherwise documented below in the visit note. 

## 2014-01-16 ENCOUNTER — Encounter: Payer: BC Managed Care – PPO | Admitting: Gynecology

## 2014-01-17 ENCOUNTER — Encounter: Payer: Self-pay | Admitting: Gynecology

## 2014-01-17 ENCOUNTER — Ambulatory Visit (INDEPENDENT_AMBULATORY_CARE_PROVIDER_SITE_OTHER): Payer: BC Managed Care – PPO | Admitting: Gynecology

## 2014-01-17 VITALS — BP 120/76 | Ht 64.5 in | Wt 126.0 lb

## 2014-01-17 DIAGNOSIS — N6001 Solitary cyst of right breast: Secondary | ICD-10-CM

## 2014-01-17 DIAGNOSIS — Z79890 Hormone replacement therapy (postmenopausal): Secondary | ICD-10-CM

## 2014-01-17 DIAGNOSIS — Z01419 Encounter for gynecological examination (general) (routine) without abnormal findings: Secondary | ICD-10-CM

## 2014-01-17 DIAGNOSIS — N926 Irregular menstruation, unspecified: Secondary | ICD-10-CM

## 2014-01-17 DIAGNOSIS — N6002 Solitary cyst of left breast: Secondary | ICD-10-CM

## 2014-01-17 MED ORDER — ESTRADIOL 0.075 MG/24HR TD PTWK: 0.0750 mg | MEDICATED_PATCH | TRANSDERMAL | Status: DC

## 2014-01-17 MED ORDER — PROGESTERONE MICRONIZED 100 MG PO CAPS: 100.0000 mg | ORAL_CAPSULE | Freq: Every day | ORAL | Status: DC

## 2014-01-17 NOTE — Patient Instructions (Addendum)
Follow up for ultrasound as scheduled.  Follow up by phone as to how you're doing with the lower dose of estrogen patch within 3 months for dose adjustment or refill.  You may obtain a copy of any labs that were done today by logging onto MyChart as outlined in the instructions provided with your AVS (after visit summary). The office will not call with normal lab results but certainly if there are any significant abnormalities then we will contact you.   Health Maintenance, Female A healthy lifestyle and preventative care can promote health and wellness.  Maintain regular health, dental, and eye exams.  Eat a healthy diet. Foods like vegetables, fruits, whole grains, low-fat dairy products, and lean protein foods contain the nutrients you need without too many calories. Decrease your intake of foods high in solid fats, added sugars, and salt. Get information about a proper diet from your caregiver, if necessary.  Regular physical exercise is one of the most important things you can do for your health. Most adults should get at least 150 minutes of moderate-intensity exercise (any activity that increases your heart rate and causes you to sweat) each week. In addition, most adults need muscle-strengthening exercises on 2 or more days a week.   Maintain a healthy weight. The body mass index (BMI) is a screening tool to identify possible weight problems. It provides an estimate of body fat based on height and weight. Your caregiver can help determine your BMI, and can help you achieve or maintain a healthy weight. For adults 20 years and older:  A BMI below 18.5 is considered underweight.  A BMI of 18.5 to 24.9 is normal.  A BMI of 25 to 29.9 is considered overweight.  A BMI of 30 and above is considered obese.  Maintain normal blood lipids and cholesterol by exercising and minimizing your intake of saturated fat. Eat a balanced diet with plenty of fruits and vegetables. Blood tests for lipids  and cholesterol should begin at age 48 and be repeated every 5 years. If your lipid or cholesterol levels are high, you are over 50, or you are a high risk for heart disease, you may need your cholesterol levels checked more frequently.Ongoing high lipid and cholesterol levels should be treated with medicines if diet and exercise are not effective.  If you smoke, find out from your caregiver how to quit. If you do not use tobacco, do not start.  Lung cancer screening is recommended for adults aged 49 80 years who are at high risk for developing lung cancer because of a history of smoking. Yearly low-dose computed tomography (CT) is recommended for people who have at least a 30-pack-year history of smoking and are a current smoker or have quit within the past 15 years. A pack year of smoking is smoking an average of 1 pack of cigarettes a day for 1 year (for example: 1 pack a day for 30 years or 2 packs a day for 15 years). Yearly screening should continue until the smoker has stopped smoking for at least 15 years. Yearly screening should also be stopped for people who develop a health problem that would prevent them from having lung cancer treatment.  If you are pregnant, do not drink alcohol. If you are breastfeeding, be very cautious about drinking alcohol. If you are not pregnant and choose to drink alcohol, do not exceed 1 drink per day. One drink is considered to be 12 ounces (355 mL) of beer, 5 ounces (148 mL)  of wine, or 1.5 ounces (44 mL) of liquor.  Avoid use of street drugs. Do not share needles with anyone. Ask for help if you need support or instructions about stopping the use of drugs.  High blood pressure causes heart disease and increases the risk of stroke. Blood pressure should be checked at least every 1 to 2 years. Ongoing high blood pressure should be treated with medicines, if weight loss and exercise are not effective.  If you are 36 to 52 years old, ask your caregiver if you  should take aspirin to prevent strokes.  Diabetes screening involves taking a blood sample to check your fasting blood sugar level. This should be done once every 3 years, after age 59, if you are within normal weight and without risk factors for diabetes. Testing should be considered at a younger age or be carried out more frequently if you are overweight and have at least 1 risk factor for diabetes.  Breast cancer screening is essential preventative care for women. You should practice "breast self-awareness." This means understanding the normal appearance and feel of your breasts and may include breast self-examination. Any changes detected, no matter how small, should be reported to a caregiver. Women in their 82s and 30s should have a clinical breast exam (CBE) by a caregiver as part of a regular health exam every 1 to 3 years. After age 73, women should have a CBE every year. Starting at age 2, women should consider having a mammogram (breast X-ray) every year. Women who have a family history of breast cancer should talk to their caregiver about genetic screening. Women at a high risk of breast cancer should talk to their caregiver about having an MRI and a mammogram every year.  Breast cancer gene (BRCA)-related cancer risk assessment is recommended for women who have family members with BRCA-related cancers. BRCA-related cancers include breast, ovarian, tubal, and peritoneal cancers. Having family members with these cancers may be associated with an increased risk for harmful changes (mutations) in the breast cancer genes BRCA1 and BRCA2. Results of the assessment will determine the need for genetic counseling and BRCA1 and BRCA2 testing.  The Pap test is a screening test for cervical cancer. Women should have a Pap test starting at age 29. Between ages 14 and 70, Pap tests should be repeated every 2 years. Beginning at age 78, you should have a Pap test every 3 years as long as the past 3 Pap tests  have been normal. If you had a hysterectomy for a problem that was not cancer or a condition that could lead to cancer, then you no longer need Pap tests. If you are between ages 11 and 25, and you have had normal Pap tests going back 10 years, you no longer need Pap tests. If you have had past treatment for cervical cancer or a condition that could lead to cancer, you need Pap tests and screening for cancer for at least 20 years after your treatment. If Pap tests have been discontinued, risk factors (such as a new sexual partner) need to be reassessed to determine if screening should be resumed. Some women have medical problems that increase the chance of getting cervical cancer. In these cases, your caregiver may recommend more frequent screening and Pap tests.  The human papillomavirus (HPV) test is an additional test that may be used for cervical cancer screening. The HPV test looks for the virus that can cause the cell changes on the cervix. The cells collected  during the Pap test can be tested for HPV. The HPV test could be used to screen women aged 71 years and older, and should be used in women of any age who have unclear Pap test results. After the age of 15, women should have HPV testing at the same frequency as a Pap test.  Colorectal cancer can be detected and often prevented. Most routine colorectal cancer screening begins at the age of 55 and continues through age 73. However, your caregiver may recommend screening at an earlier age if you have risk factors for colon cancer. On a yearly basis, your caregiver may provide home test kits to check for hidden blood in the stool. Use of a small camera at the end of a tube, to directly examine the colon (sigmoidoscopy or colonoscopy), can detect the earliest forms of colorectal cancer. Talk to your caregiver about this at age 33, when routine screening begins. Direct examination of the colon should be repeated every 5 to 10 years through age 31, unless  early forms of pre-cancerous polyps or small growths are found.  Hepatitis C blood testing is recommended for all people born from [birth year] through 1964 and any individual with known risks for hepatitis C.  Practice safe sex. Use condoms and avoid high-risk sexual practices to reduce the spread of sexually transmitted infections (STIs). Sexually active women aged 57 and younger should be checked for Chlamydia, which is a common sexually transmitted infection. Older women with new or multiple partners should also be tested for Chlamydia. Testing for other STIs is recommended if you are sexually active and at increased risk.  Osteoporosis is a disease in which the bones lose minerals and strength with aging. This can result in serious bone fractures. The risk of osteoporosis can be identified using a bone density scan. Women ages 21 and over and women at risk for fractures or osteoporosis should discuss screening with their caregivers. Ask your caregiver whether you should be taking a calcium supplement or vitamin D to reduce the rate of osteoporosis.  Menopause can be associated with physical symptoms and risks. Hormone replacement therapy is available to decrease symptoms and risks. You should talk to your caregiver about whether hormone replacement therapy is right for you.  Use sunscreen. Apply sunscreen liberally and repeatedly throughout the day. You should seek shade when your shadow is shorter than you. Protect yourself by wearing long sleeves, pants, a wide-brimmed hat, and sunglasses year round, whenever you are outdoors.  Notify your caregiver of new moles or changes in moles, especially if there is a change in shape or color. Also notify your caregiver if a mole is larger than the size of a pencil eraser.  Stay current with your immunizations. Document Released: 08/02/2010 Document Revised: 05/14/2012 Document Reviewed: 08/02/2010 [facility] Patient Information 2014 [place].

## 2014-01-17 NOTE — Progress Notes (Signed)
Regina Mendez 1960/08/02 [id number]        53 y.o.  G2P2002 for annual exam.  Several issues noted below.  Past medical history,surgical history, problem list, medications, allergies, family history and social history were all reviewed and documented as reviewed in the EPIC chart.  ROS:  Performed with pertinent positives and negatives included in the history, assessment and plan.   Additional significant findings :  none   Exam: Kim Counsellor Vitals:   01/17/14 1416  BP: 120/76  Height: 5' 4.5" (1.638 m)  Weight: 126 lb (57.153 kg)   General appearance:  Normal affect, orientation and appearance. Skin: Grossly normal HEENT: Without gross lesions.  No cervical or supraclavicular adenopathy. Thyroid normal.  Lungs:  Clear without wheezing, rales or rhonchi Cardiac: RR, without RMG Abdominal:  Soft, nontender, without masses, guarding, rebound, organomegaly or hernia Breasts:  Examined lying and sitting. With multiple cystic masses as outlined below. No retractions are or axillary adenopathy.  Physical Exam  Pulmonary/Chest:      Pelvic   Ext/BUS/vagina grossly normal with mild atrophic changes.  Cervix normal  Uterus retroverted, normal size, shape and contour, midline and mobile nontender   Adnexa  Without masses or tenderness    Anus and perineum  Normal   Rectovaginal  Normal sphincter tone without palpated masses or tenderness.   Procedure: The skin overlying the 5 cystic masses were cleansed with alcohol, infiltrated with 1% lidocaine and all cystic areas were emptied  #1 with greenish fluid 8 cc  #2 clear yellow fluid 6 cc  #3 clear yellow fluid 8 cc  #4 clear yellow fluid 10 cc  #5 clear yellow fluid 10 cc.  All palpable areas disappeared after aspiration. Patient tolerated well.   Assessment/Plan:  53 y.o. G70P2002 female for annual exam.   1. Breast cysts, multiple as described above.  Patient had recent mammogram and ultrasound August 2015 with  multiple breast cysts noted. She was recommended for a 6 month follow up study for stability. She notes that several of the cystic areas are tender. On exam she had 5 palpable masses consistent with cysts as diagrammed above. Patient asked to have them aspirated from a symptomatic standpoint as well as to sure that they were cysts and not solid. Aspiration was accomplished and all palpable areas disappeared. Patient will continue with self breast exam and report any recurrence she'll follow up at the 6 month mark for studies as recommended by radiology.  She does have 2 first cousins with premenopausal breast cancer but has not found out if they were BRCA tested. We discussed this in the past as has Dr. Cherylann Banas and genetic testing was offered to the patient but she declines at this time. 2. HRT/menopausal symptoms. Patient continues on Climara 0.1 equivalent and Prometrium 100 mg nightly. She was switched from a 200 mg intermittent dosage last year. She's done well although does note some bleeding on and off throughout the past year. Never heavy but more staining occurring once or twice a month. Having good relief of her hot flashes and night sweats. Recommend sonohysterogram to rule out intracavitary abnormalities and allow sampling of the endometrium and she's going to schedule this. I again reviewed the whole issue of HRT to include increased risk of stroke heart attack DVT and breast cancer. The ACOG and NAMS statements for lowest dose for shortest period of time discussed. At this point the patient wants to continue but I suggested decreasing her dose to 0.0753 months and  she'll call me during this time to see how she's doing. If she is doing well will attempt to decrease to the 0.05 mg patch in hopes to decrease her breast cyst issues.  She will continue on the Prometrium 100 mg nightly. Patient will call me within 3 months as I did not refill her past this and will see how she's doing. 3. Pap smear 2013.   No Pap smear done today. History of cryosurgery in her 15s with normal Pap smears afterwards. Plan repeat Pap smear next year at three-year interval. 4. Colonoscopy 2012.  Repeat at their recommended interval. 5. DEXA reported 2010 and normal.  Plan repeat further into the menopause as she is on HRT. Increase calcium vitamin D reviewed. 6. Health maintenance.  No routine blood work done as this is done through her primary physician's office. Patient will follow up within 3 months in response to lowering her dose of HRT.     [name] MD, 3:04 PM 01/17/2014

## 2014-02-03 ENCOUNTER — Other Ambulatory Visit: Payer: Self-pay | Admitting: Gynecology

## 2014-02-03 DIAGNOSIS — N950 Postmenopausal bleeding: Secondary | ICD-10-CM

## 2014-02-10 ENCOUNTER — Other Ambulatory Visit: Payer: BC Managed Care – PPO

## 2014-02-10 ENCOUNTER — Ambulatory Visit: Payer: BC Managed Care – PPO | Admitting: Gynecology

## 2014-02-20 ENCOUNTER — Ambulatory Visit: Payer: BC Managed Care – PPO | Admitting: Gynecology

## 2014-02-20 ENCOUNTER — Other Ambulatory Visit: Payer: BC Managed Care – PPO

## 2014-02-26 ENCOUNTER — Ambulatory Visit (INDEPENDENT_AMBULATORY_CARE_PROVIDER_SITE_OTHER): Payer: BC Managed Care – PPO | Admitting: Family Medicine

## 2014-02-26 ENCOUNTER — Encounter: Payer: Self-pay | Admitting: Family Medicine

## 2014-02-26 DIAGNOSIS — R300 Dysuria: Secondary | ICD-10-CM

## 2014-02-26 LAB — POCT URINALYSIS DIPSTICK
BILIRUBIN UA: NEGATIVE
GLUCOSE UA: NEGATIVE
Ketones, UA: NEGATIVE
LEUKOCYTES UA: NEGATIVE
NITRITE UA: NEGATIVE
PH UA: 7.5
Protein, UA: NEGATIVE
RBC UA: NEGATIVE
SPEC GRAV UA: 1.015
UROBILINOGEN UA: 0.2

## 2014-02-26 NOTE — Progress Notes (Signed)
   Subjective:    Patient ID: Regina Mendez, female    DOB: Jul 22, 1960, 54 y.o.   MRN: [id number]  HPI Regina Mendez is a 54 year old married female nonsmoker who comes in today for evaluation of blood in the urine  She tells me she was in [place] today seeing her $20 for in the psychiatric eating disorder unit at [facility]. She went the bathroom and noticed redness in her urine. She has no urinary tract symptoms    Review of Systems Review of systems otherwise negative except she's having GYN problems last period was a week ago    Objective:   Physical Exam  Well-developed well-nourished female no acute distress vital signs stable she's afebrile urinalysis normal      Assessment & Plan:  Normal urine............Marland Kitchen menstrual spotting.. Followed by GYN.

## 2014-02-26 NOTE — Progress Notes (Signed)
Pre visit review using our clinic review tool, if applicable. No additional management support is needed unless otherwise documented below in the visit note. 

## 2014-02-26 NOTE — Patient Instructions (Signed)
Follow up with your GYN 

## 2014-03-03 ENCOUNTER — Other Ambulatory Visit: Payer: Self-pay | Admitting: Family Medicine

## 2014-03-03 DIAGNOSIS — N6002 Solitary cyst of left breast: Secondary | ICD-10-CM

## 2014-03-13 ENCOUNTER — Ambulatory Visit: Payer: BC Managed Care – PPO | Admitting: Gynecology

## 2014-03-13 ENCOUNTER — Other Ambulatory Visit: Payer: BC Managed Care – PPO

## 2014-03-24 ENCOUNTER — Other Ambulatory Visit: Payer: BC Managed Care – PPO

## 2014-03-24 ENCOUNTER — Other Ambulatory Visit: Payer: Self-pay | Admitting: Gynecology

## 2014-03-24 ENCOUNTER — Ambulatory Visit (INDEPENDENT_AMBULATORY_CARE_PROVIDER_SITE_OTHER): Payer: BC Managed Care – PPO

## 2014-03-24 ENCOUNTER — Encounter: Payer: Self-pay | Admitting: Gynecology

## 2014-03-24 ENCOUNTER — Ambulatory Visit (INDEPENDENT_AMBULATORY_CARE_PROVIDER_SITE_OTHER): Payer: BC Managed Care – PPO | Admitting: Gynecology

## 2014-03-24 DIAGNOSIS — N83202 Unspecified ovarian cyst, left side: Secondary | ICD-10-CM

## 2014-03-24 DIAGNOSIS — N8320 Unspecified ovarian cysts: Secondary | ICD-10-CM

## 2014-03-24 DIAGNOSIS — N950 Postmenopausal bleeding: Secondary | ICD-10-CM

## 2014-03-24 DIAGNOSIS — N926 Irregular menstruation, unspecified: Secondary | ICD-10-CM

## 2014-03-24 DIAGNOSIS — Z79890 Hormone replacement therapy (postmenopausal): Secondary | ICD-10-CM

## 2014-03-24 MED ORDER — ESTRADIOL 0.05 MG/24HR TD PTWK: 0.0500 mg | MEDICATED_PATCH | TRANSDERMAL | Status: DC

## 2014-03-24 MED ORDER — PROGESTERONE MICRONIZED 100 MG PO CAPS: 100.0000 mg | ORAL_CAPSULE | Freq: Every day | ORAL | Status: DC

## 2014-03-24 NOTE — Progress Notes (Signed)
Regina Mendez 08-11-1960 [id number]        54 y.o.  G2P2002 presents for sonohysterogram due to continued postmenopausal bleeding on HRT. She has decreased her Climara from 0.1 mg to 0.075 and continues on Prometrium 100 mg nightly. Had previously been on 200 mg of Prometrium for 12 days each month with light withdrawal bleeding.  Past medical history,surgical history, problem list, medications, allergies, family history and social history were all reviewed and documented in the EPIC chart.  Directed ROS with pertinent positives and negatives documented in the history of present illness/assessment and plan.  Exam: [place] assistant General appearance:  Normal External BUS vagina with slight light staining.  Cervix grossly normal. Uterus retroverted grossly normal size midline mobile. Adnexa without masses  Ultrasound shows uterus normal size and echotexture. Endometrial echo 4.2 mm. Left ovary with thin walled echo-free avascular cyst 36 mm mean. Right ovary grossly normal. Cul-de-sac negative.  Sonohysterogram performed, sterile technique, easy catheter introduction, good distention with no abnormalities. Endometrial sample taken. Patient tolerated well.  Assessment/Plan:  54 y.o. [id number] with postmenopausal bleeding and negative sonohysterogram. Patient will follow up for biopsy results. She will decrease her Climara to 0.05 mg and continue on Prometrium 100 mg nightly and see if this does not eradicate her bleeding. Patient will call if she continues to bleed or if she has acceptable menopausal symptoms. I reviewed the ovarian cyst with her in the differential to include ovarian cancer versus benign. Given no symptoms, the small size, simplicity and avascular nature we'll plan expectant management with follow up ultrasound in 2-3 months. Patient's comfortable with this approach accepting that I cannot prove that is not cancer at this point without removal.     [name] MD, 12:59  PM 03/24/2014

## 2014-03-24 NOTE — Patient Instructions (Signed)
Follow up for the ultrasound in 2-3 months as scheduled. Call me if you have continued bleeding.

## 2014-03-26 ENCOUNTER — Ambulatory Visit
Admission: RE | Admit: 2014-03-26 | Discharge: 2014-03-26 | Disposition: A | Discharge status: Home / Self Care | Payer: BC Managed Care – PPO | Source: Ambulatory Visit | Attending: Family Medicine | Admitting: Family Medicine

## 2014-03-26 DIAGNOSIS — N6002 Solitary cyst of left breast: Secondary | ICD-10-CM

## 2014-06-05 ENCOUNTER — Ambulatory Visit (INDEPENDENT_AMBULATORY_CARE_PROVIDER_SITE_OTHER): Payer: BC Managed Care – PPO | Admitting: Psychology

## 2014-06-05 DIAGNOSIS — F411 Generalized anxiety disorder: Secondary | ICD-10-CM | POA: Diagnosis not present

## 2014-06-12 ENCOUNTER — Ambulatory Visit (INDEPENDENT_AMBULATORY_CARE_PROVIDER_SITE_OTHER): Payer: BC Managed Care – PPO | Admitting: Psychology

## 2014-06-12 DIAGNOSIS — F411 Generalized anxiety disorder: Secondary | ICD-10-CM | POA: Diagnosis not present

## 2014-06-20 ENCOUNTER — Ambulatory Visit (INDEPENDENT_AMBULATORY_CARE_PROVIDER_SITE_OTHER): Payer: BC Managed Care – PPO | Admitting: Psychology

## 2014-06-20 DIAGNOSIS — F411 Generalized anxiety disorder: Secondary | ICD-10-CM

## 2014-07-04 ENCOUNTER — Ambulatory Visit (INDEPENDENT_AMBULATORY_CARE_PROVIDER_SITE_OTHER): Payer: BC Managed Care – PPO | Admitting: Psychology

## 2014-07-04 DIAGNOSIS — F411 Generalized anxiety disorder: Secondary | ICD-10-CM | POA: Diagnosis not present

## 2014-07-10 ENCOUNTER — Ambulatory Visit (INDEPENDENT_AMBULATORY_CARE_PROVIDER_SITE_OTHER): Payer: BC Managed Care – PPO | Admitting: Psychology

## 2014-07-10 DIAGNOSIS — F411 Generalized anxiety disorder: Secondary | ICD-10-CM

## 2014-07-17 ENCOUNTER — Ambulatory Visit (INDEPENDENT_AMBULATORY_CARE_PROVIDER_SITE_OTHER): Payer: BC Managed Care – PPO | Admitting: Psychology

## 2014-07-17 DIAGNOSIS — F411 Generalized anxiety disorder: Secondary | ICD-10-CM | POA: Diagnosis not present

## 2014-07-24 ENCOUNTER — Ambulatory Visit: Payer: BC Managed Care – PPO | Admitting: Psychology

## 2014-07-31 ENCOUNTER — Ambulatory Visit (INDEPENDENT_AMBULATORY_CARE_PROVIDER_SITE_OTHER): Payer: BC Managed Care – PPO | Admitting: Psychology

## 2014-07-31 DIAGNOSIS — F411 Generalized anxiety disorder: Secondary | ICD-10-CM

## 2014-08-01 ENCOUNTER — Telehealth: Payer: Self-pay | Admitting: *Deleted

## 2014-08-01 NOTE — Telephone Encounter (Signed)
(  pt aware you are out of the office) Pt called requesting to increase estrdiol patch to 0.075mg  states hot flashes are getting worse. Please advise

## 2014-08-04 NOTE — Telephone Encounter (Signed)
OK to increase 

## 2014-08-05 MED ORDER — ESTRADIOL 0.075 MG/24HR TD PTWK: 0.0750 mg | MEDICATED_PATCH | TRANSDERMAL | Status: DC

## 2014-08-05 NOTE — Telephone Encounter (Signed)
Left on voicemail Rx sent 

## 2014-08-07 ENCOUNTER — Ambulatory Visit: Payer: BC Managed Care – PPO | Admitting: Psychology

## 2014-08-14 ENCOUNTER — Ambulatory Visit: Payer: BC Managed Care – PPO | Admitting: Psychology

## 2014-08-21 ENCOUNTER — Ambulatory Visit (INDEPENDENT_AMBULATORY_CARE_PROVIDER_SITE_OTHER): Payer: BC Managed Care – PPO | Admitting: Psychology

## 2014-08-21 DIAGNOSIS — F411 Generalized anxiety disorder: Secondary | ICD-10-CM | POA: Diagnosis not present

## 2014-08-28 ENCOUNTER — Ambulatory Visit (INDEPENDENT_AMBULATORY_CARE_PROVIDER_SITE_OTHER): Payer: BC Managed Care – PPO | Admitting: Psychology

## 2014-08-28 DIAGNOSIS — F411 Generalized anxiety disorder: Secondary | ICD-10-CM | POA: Diagnosis not present

## 2014-09-04 ENCOUNTER — Ambulatory Visit (INDEPENDENT_AMBULATORY_CARE_PROVIDER_SITE_OTHER): Payer: BC Managed Care – PPO | Admitting: Psychology

## 2014-09-04 DIAGNOSIS — F411 Generalized anxiety disorder: Secondary | ICD-10-CM | POA: Diagnosis not present

## 2014-09-17 ENCOUNTER — Ambulatory Visit (INDEPENDENT_AMBULATORY_CARE_PROVIDER_SITE_OTHER): Payer: BC Managed Care – PPO | Admitting: Psychology

## 2014-09-17 DIAGNOSIS — F411 Generalized anxiety disorder: Secondary | ICD-10-CM | POA: Diagnosis not present

## 2014-09-23 ENCOUNTER — Ambulatory Visit (INDEPENDENT_AMBULATORY_CARE_PROVIDER_SITE_OTHER): Payer: BC Managed Care – PPO | Admitting: Psychology

## 2014-09-23 DIAGNOSIS — F411 Generalized anxiety disorder: Secondary | ICD-10-CM

## 2014-10-01 ENCOUNTER — Ambulatory Visit: Payer: BC Managed Care – PPO | Admitting: Psychology

## 2014-10-08 ENCOUNTER — Ambulatory Visit (INDEPENDENT_AMBULATORY_CARE_PROVIDER_SITE_OTHER): Payer: BC Managed Care – PPO | Admitting: Psychology

## 2014-10-08 DIAGNOSIS — F411 Generalized anxiety disorder: Secondary | ICD-10-CM | POA: Diagnosis not present

## 2014-10-23 ENCOUNTER — Ambulatory Visit (INDEPENDENT_AMBULATORY_CARE_PROVIDER_SITE_OTHER): Payer: BC Managed Care – PPO | Admitting: Psychology

## 2014-10-23 DIAGNOSIS — F411 Generalized anxiety disorder: Secondary | ICD-10-CM

## 2014-10-31 ENCOUNTER — Ambulatory Visit: Payer: BC Managed Care – PPO | Admitting: Psychology

## 2014-11-10 ENCOUNTER — Telehealth: Payer: Self-pay | Admitting: *Deleted

## 2014-11-10 ENCOUNTER — Ambulatory Visit: Payer: BC Managed Care – PPO | Admitting: Psychology

## 2014-11-10 MED ORDER — ESTRADIOL 0.1 MG/24HR TD PTWK: 0.1000 mg | MEDICATED_PATCH | TRANSDERMAL | Status: DC

## 2014-11-10 NOTE — Telephone Encounter (Signed)
Left message husband Rx has been sent to [facility].

## 2014-11-10 NOTE — Telephone Encounter (Signed)
Pt is currently taking climara patch 0.075 mg and still having hot flashes, would like to increase patch if possible. Annual due in Dec. Please advise

## 2014-11-10 NOTE — Telephone Encounter (Signed)
Okay to go to 0.1 milligram Climara patch.

## 2014-11-17 ENCOUNTER — Ambulatory Visit (INDEPENDENT_AMBULATORY_CARE_PROVIDER_SITE_OTHER): Payer: BC Managed Care – PPO | Admitting: Psychology

## 2014-11-17 DIAGNOSIS — F411 Generalized anxiety disorder: Secondary | ICD-10-CM | POA: Diagnosis not present

## 2014-11-24 ENCOUNTER — Ambulatory Visit: Payer: BC Managed Care – PPO | Admitting: Psychology

## 2014-11-25 ENCOUNTER — Other Ambulatory Visit: Payer: Self-pay | Admitting: Family Medicine

## 2014-11-25 DIAGNOSIS — N649 Disorder of breast, unspecified: Secondary | ICD-10-CM

## 2014-12-01 ENCOUNTER — Ambulatory Visit
Admission: RE | Admit: 2014-12-01 | Discharge: 2014-12-01 | Disposition: A | Discharge status: Home / Self Care | Payer: BC Managed Care – PPO | Source: Ambulatory Visit | Attending: Family Medicine | Admitting: Family Medicine

## 2014-12-01 DIAGNOSIS — N649 Disorder of breast, unspecified: Secondary | ICD-10-CM

## 2014-12-01 LAB — HM MAMMOGRAPHY

## 2014-12-19 ENCOUNTER — Ambulatory Visit (INDEPENDENT_AMBULATORY_CARE_PROVIDER_SITE_OTHER): Payer: BC Managed Care – PPO | Admitting: Psychology

## 2014-12-19 DIAGNOSIS — F411 Generalized anxiety disorder: Secondary | ICD-10-CM | POA: Diagnosis not present

## 2015-01-01 ENCOUNTER — Encounter: Payer: Self-pay | Admitting: Family Medicine

## 2015-02-03 ENCOUNTER — Other Ambulatory Visit (INDEPENDENT_AMBULATORY_CARE_PROVIDER_SITE_OTHER): Payer: BC Managed Care – PPO

## 2015-02-03 ENCOUNTER — Other Ambulatory Visit: Payer: BC Managed Care – PPO

## 2015-02-03 DIAGNOSIS — Z0000 Encounter for general adult medical examination without abnormal findings: Secondary | ICD-10-CM

## 2015-02-03 LAB — CBC WITH DIFFERENTIAL/PLATELET
BASOS ABS: 0 10*3/uL (ref 0.0–0.1)
Basophils Relative: 0.7 % (ref 0.0–3.0)
Eosinophils Absolute: 0.2 10*3/uL (ref 0.0–0.7)
Eosinophils Relative: 3.2 % (ref 0.0–5.0)
HEMATOCRIT: 38.6 % (ref 36.0–46.0)
HEMOGLOBIN: 12.8 g/dL (ref 12.0–15.0)
LYMPHS PCT: 28.3 % (ref 12.0–46.0)
Lymphs Abs: 2 10*3/uL (ref 0.7–4.0)
MCHC: 33.2 g/dL (ref 30.0–36.0)
MCV: 94.8 fl (ref 78.0–100.0)
MONOS PCT: 11 % (ref 3.0–12.0)
Monocytes Absolute: 0.8 10*3/uL (ref 0.1–1.0)
NEUTROS PCT: 56.8 % (ref 43.0–77.0)
Neutro Abs: 4 10*3/uL (ref 1.4–7.7)
Platelets: 234 10*3/uL (ref 150.0–400.0)
RBC: 4.07 Mil/uL (ref 3.87–5.11)
RDW: 13.4 % (ref 11.5–15.5)
WBC: 7.1 10*3/uL (ref 4.0–10.5)

## 2015-02-03 LAB — BASIC METABOLIC PANEL
BUN: 12 mg/dL (ref 6–23)
CALCIUM: 9.1 mg/dL (ref 8.4–10.5)
CO2: 30 mEq/L (ref 19–32)
Chloride: 104 mEq/L (ref 96–112)
Creatinine, Ser: 0.72 mg/dL (ref 0.40–1.20)
GFR: 89.78 mL/min (ref >60.00)
Glucose, Bld: 93 mg/dL (ref 70–99)
Potassium: 4.2 mEq/L (ref 3.5–5.1)
SODIUM: 137 meq/L (ref 135–145)

## 2015-02-03 LAB — HEPATIC FUNCTION PANEL
ALBUMIN: 3.6 g/dL (ref 3.5–5.2)
ALK PHOS: 46 U/L (ref 39–117)
ALT: 11 U/L (ref 0–35)
AST: 16 U/L (ref 0–37)
Bilirubin, Direct: 0.1 mg/dL (ref 0.0–0.3)
TOTAL PROTEIN: 6.3 g/dL (ref 6.0–8.3)
Total Bilirubin: 0.3 mg/dL (ref 0.2–1.2)

## 2015-02-03 LAB — LIPID PANEL
Cholesterol: 185 mg/dL (ref 0–200)
HDL: 39.9 mg/dL (ref >39.00)
LDL Cholesterol: 118 mg/dL — ABNORMAL HIGH (ref 0–99)
NONHDL: 144.87
Total CHOL/HDL Ratio: 5
Triglycerides: 135 mg/dL (ref 0.0–149.0)
VLDL: 27 mg/dL (ref 0.0–40.0)

## 2015-02-03 LAB — TSH: TSH: 1.69 u[IU]/mL (ref 0.35–4.50)

## 2015-02-03 LAB — POCT URINALYSIS DIPSTICK
Bilirubin, UA: NEGATIVE
Glucose, UA: NEGATIVE
KETONES UA: NEGATIVE
LEUKOCYTES UA: NEGATIVE
NITRITE UA: NEGATIVE
PH UA: 5.5
PROTEIN UA: NEGATIVE
Spec Grav, UA: 1.025
Urobilinogen, UA: 0.2

## 2015-02-05 ENCOUNTER — Encounter: Payer: Self-pay | Admitting: Gynecology

## 2015-02-05 ENCOUNTER — Other Ambulatory Visit (HOSPITAL_COMMUNITY)
Admission: RE | Admit: 2015-02-05 | Discharge: 2015-02-05 | Disposition: A | Discharge status: Home / Self Care | Payer: BC Managed Care – PPO | Source: Ambulatory Visit | Attending: Gynecology | Admitting: Gynecology

## 2015-02-05 ENCOUNTER — Ambulatory Visit (INDEPENDENT_AMBULATORY_CARE_PROVIDER_SITE_OTHER): Payer: BC Managed Care – PPO | Admitting: Gynecology

## 2015-02-05 VITALS — BP 118/76 | Ht 65.0 in | Wt 126.0 lb

## 2015-02-05 DIAGNOSIS — Z01419 Encounter for gynecological examination (general) (routine) without abnormal findings: Secondary | ICD-10-CM

## 2015-02-05 DIAGNOSIS — N6002 Solitary cyst of left breast: Secondary | ICD-10-CM | POA: Diagnosis not present

## 2015-02-05 DIAGNOSIS — Z79890 Hormone replacement therapy (postmenopausal): Secondary | ICD-10-CM | POA: Diagnosis not present

## 2015-02-05 MED ORDER — ESTRADIOL 0.075 MG/24HR TD PTWK: 0.0750 mg | MEDICATED_PATCH | TRANSDERMAL | Status: DC

## 2015-02-05 MED ORDER — PROGESTERONE MICRONIZED 100 MG PO CAPS: 100.0000 mg | ORAL_CAPSULE | Freq: Every day | ORAL | Status: DC

## 2015-02-05 NOTE — Patient Instructions (Signed)

## 2015-02-05 NOTE — Progress Notes (Signed)
Regina Mendez 31-Aug-1960 [id number]        55 y.o.  G2P2002  for annual exam. Several issues noted below  Past medical history,surgical history, problem list, medications, allergies, family history and social history were all reviewed and documented as reviewed in the EPIC chart.  ROS:  Performed with pertinent positives and negatives included in the history, assessment and plan.   Additional significant findings :  none   Exam: Regina Mendez assistant Filed Vitals:   02/05/15 1359  BP: 118/76  Height: _0  (1.651 m)  Weight: 126 lb (57.153 kg)   General appearance:  Normal affect, orientation and appearance. Skin: Grossly normal HEENT: Without gross lesions.  No cervical or supraclavicular adenopathy. Thyroid normal.  Lungs:  Clear without wheezing, rales or rhonchi Cardiac: RR, without RMG Abdominal:  Soft, nontender, without masses, guarding, rebound, organomegaly or hernia Breasts:  Examined lying and sitting. Right without masses, retractions, discharge or axillary adenopathy.  Left with 3 cm cystic mass 5:00 position to bring her breasts off the areola. Questionable cyst 12:00 to progress of the areola. No skin changes nipple discharge or axillary adenopathy Physical Exam  Pulmonary/Chest:      Pelvic:  Ext/BUS/vagina with atrophic changes  Cervix normal. Pap smear done  Uterus retroverted, normal size, shape and contour, midline and mobile nontender   Adnexa  Without masses or tenderness    Anus and perineum  Normal   Rectovaginal  Normal sphincter tone without palpated masses or tenderness.    Assessment/Plan:  55 y.o. G55P2002 female for annual exam.   1. Postmenopausal/atrophic genital changes/HRT. Continues on Climara 0.1 mg patch and Prometrium 100 mg. Was evaluated earlier this year for episode of postmenopausal bleeding with negative sonohysterogram and atrophic endometrium on biopsy. Has had 2 further episodes. She does want to continue on HRT but asked if she  could try lower dose of estrogen with the 0.075 patch. I again reviewed the risks to include stroke heart attack DVT and breast cancer. After lengthy discussion patient was to continue at this point and I refilled her with the 0.075 mg patch and Prometrium 100 mg nightly 1 year.  Patient knows to report any further bleeding. Otherwise if does well on the lower dose then continue through next year. 2. Cyst left breast 3:00 position. Questionable small cyst at 12:00 versus normal dense breast tissue. The skin overlying both areas were cleansed with alcohol and infiltrated with 1% Xylocaine. 5 cc clear yellow fluid extracted from the 3:00 cyst which completely resolved the palpable abnormality. No fluid extracted from the 12:00 position. Most recent mammogram October 2016. Small probable fibroadenoma noted at 10:00 position with recommended follow up in 1 year with ultrasound and mammogram. Patient will continue with self breast exams as long as no palpable abnormalities them will follow up for her mammogram as scheduled. Had discuss BRCA testing based on 2 first cousins with premenopausal breast cancer in the past and she has declined genetic testing. 3. Pap smear 2013. Pap smear done today. H History of cryosurgery in her 1s with normal Pap smear afterwards. 4. Colonoscopy 2012. Repeat at their recommended interval. 5. DEXA 2010 reported normal. Will plan repeat further into the menopause. 6. Health maintenance. No routine blood work done as this is done after primary physician's office. Follow up in one year, sooner as needed.   [name] MD, 2:28 PM 02/05/2015

## 2015-02-05 NOTE — Addendum Note (Signed)
Addended by: Nelva Nay on: 02/05/2015 02:43 PM   Modules accepted: Orders

## 2015-02-09 ENCOUNTER — Encounter: Payer: Self-pay | Admitting: Family Medicine

## 2015-02-09 ENCOUNTER — Ambulatory Visit (INDEPENDENT_AMBULATORY_CARE_PROVIDER_SITE_OTHER): Payer: BC Managed Care – PPO | Admitting: Family Medicine

## 2015-02-09 VITALS — BP 110/70 | Temp 97.6°F | Ht 65.5 in | Wt 128.0 lb

## 2015-02-09 DIAGNOSIS — Z0000 Encounter for general adult medical examination without abnormal findings: Secondary | ICD-10-CM | POA: Diagnosis not present

## 2015-02-09 DIAGNOSIS — Z23 Encounter for immunization: Secondary | ICD-10-CM

## 2015-02-09 DIAGNOSIS — N63 Unspecified lump in unspecified breast: Secondary | ICD-10-CM

## 2015-02-09 LAB — CYTOLOGY - PAP

## 2015-02-09 NOTE — Patient Instructions (Signed)
Continue current medications  Follow-up in one year sooner if any problems  [place] or Almyra Free.......... are 2 new adult nurse. Practitioners.......Marland Kitchen or Dr. Martin[place]

## 2015-02-09 NOTE — Progress Notes (Signed)
Pre visit review using our clinic review tool, if applicable. No additional management support is needed unless otherwise documented below in the visit note. 

## 2015-02-09 NOTE — Progress Notes (Signed)
   Subjective:    Patient ID: Regina Mendez, female    DOB: 28-Apr-1960, 55 y.o.   MRN: [id number]  HPI Regina Mendez is a 55 year old married female nonsmoker who comes in today for general physical examination  She gets routine eye care, dental care, BSE monthly, annual mammography, colonoscopy initially was 2007 because of family history of colon polyps. Follow-up colonoscopy 2012 also showed some polyps. She's due to go back in 5 years.  Vaccinations updated by [name]  She saw her gynecologist Dr. Loetta Rough recently. He aspirated 2 cysts on her left breast. She has multiple cystic lesions throughout both breasts.  Her current medications include an aspirin tablet daily estrogen and Prometrium as prescribed by Dr. Loetta Rough her gynecologist  Social history unchanged except the twins now will be 54 in March. 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   HENT: Negative.   Eyes: Negative.   Respiratory: Negative.   Cardiovascular: Negative.   Gastrointestinal: Negative.   Endocrine: Negative.   Genitourinary: Negative.   Musculoskeletal: Negative.   Skin: Negative.   Allergic/Immunologic: Negative.   Neurological: Negative.   Hematological: Negative.   Psychiatric/Behavioral: Negative.        Objective:   Physical Exam  Constitutional: She appears well-developed and well-nourished.  HENT:  Head: Normocephalic and atraumatic.  Right Ear: External ear normal.  Left Ear: External ear normal.  Nose: Nose normal.  Mouth/Throat: Oropharynx is clear and moist.  Eyes: EOM are normal. Pupils are equal, round, and reactive to light.  Neck: Normal range of motion. Neck supple. No JVD present. No tracheal deviation present. No thyromegaly present.  Cardiovascular: Normal rate, regular rhythm, normal heart sounds and intact distal pulses.  Exam reveals no gallop and no friction rub.   No murmur heard. Pulmonary/Chest: Effort normal and breath sounds normal. No stridor.  No respiratory distress. She has no wheezes. She has no rales. She exhibits no tenderness.  Abdominal: Soft. Bowel sounds are normal. She exhibits no distension and no mass. There is no tenderness. There is no rebound and no guarding.  Genitourinary:  Bilateral breast exam shows multiple fibrocystic lesions throughout both breasts. Band-Aids left breast aspiration at 12 and 6:00 position.  Musculoskeletal: Normal range of motion.  Lymphadenopathy:    She has no cervical adenopathy.  Neurological: She is alert. She has normal reflexes. No cranial nerve deficit. She exhibits normal muscle tone. Coordination normal.  Skin: Skin is warm and dry. No rash noted. No erythema. No pallor.  Total body skin exam normal  Psychiatric: She has a normal mood and affect. Her behavior is normal. Judgment and thought content normal.  Nursing note and vitals reviewed.         Assessment & Plan:  Healthy female  Bilateral multiple cystic changes,,,,,, continue annual mammography  Early menopause,,,,,,,,,,, hormone replacement therapy via her GYN Dr. Loetta Rough  Migraine headaches currently asymptomatic  History of colon polyps........ followed by Dr. Collene Mares,

## 2015-04-06 ENCOUNTER — Other Ambulatory Visit: Payer: Self-pay | Admitting: Gynecology

## 2015-04-07 ENCOUNTER — Other Ambulatory Visit: Payer: Self-pay | Admitting: Gynecology

## 2015-10-01 ENCOUNTER — Encounter: Payer: Self-pay | Admitting: Family Medicine

## 2015-10-01 ENCOUNTER — Ambulatory Visit (INDEPENDENT_AMBULATORY_CARE_PROVIDER_SITE_OTHER): Payer: BC Managed Care – PPO | Admitting: Family Medicine

## 2015-10-01 VITALS — BP 92/62 | HR 80 | Temp 98.3°F | Ht 65.5 in | Wt 125.6 lb

## 2015-10-01 DIAGNOSIS — M25562 Pain in left knee: Secondary | ICD-10-CM | POA: Diagnosis not present

## 2015-10-01 NOTE — Progress Notes (Signed)
HPI:  Acute visit for:  L knee pain: -for several months intermittently with a lot more walking -pain around knee cap -worse with stairs -denies: weakness, numbness, catching, locking, giving away, swelling, redness  ROS: See pertinent positives and negatives per HPI.  Past Medical History:  Diagnosis Date  . Acne   . Breast cyst   . Migraine headache     Past Surgical History:  Procedure Laterality Date  . APPENDECTOMY    . BREAST SURGERY     right breast bx  . COLPOSCOPY    . GYNECOLOGIC CRYOSURGERY  age 12  . OVARIAN CYST SURGERY    . TONSILLECTOMY      Family History  Problem Relation Age of Onset  . Thyroid cancer Mother   . Hypertension Father   . Heart disease Father   . Breast cancer Cousin     Mat. 1st cousins X 2     Age 64's    Social History   Social History  . Marital status: Married    Spouse name: N/A  . Number of children: N/A  . Years of education: N/A   Social History Main Topics  . Smoking status: Never Smoker  . Smokeless tobacco: Never Used  . Alcohol use 0.6 oz/week    1 Standard drinks or equivalent per week  . Drug use: No  . Sexual activity: Yes    Birth control/ protection: Post-menopausal     Comment: 1st intercourse 55 yo-More than 5 partners   Other Topics Concern  . None   Social History Narrative  . None     Current Outpatient Prescriptions:  .  albuterol (PROVENTIL HFA;VENTOLIN HFA) 108 (90 BASE) MCG/ACT inhaler, Inhale 2 puffs into the lungs every 6 (six) hours as needed.  , Disp: , Rfl:  .  aspirin 81 MG tablet, Take 81 mg by mouth daily.  , Disp: , Rfl:  .  beclomethasone (QVAR) 40 MCG/ACT inhaler, Inhale 2 puffs into the lungs 2 (two) times daily.  , Disp: , Rfl:  .  BIOTIN PO, Take by mouth., Disp: , Rfl:  .  clindamycin (CLEOCIN-T) 1 % external solution, Apply topically 2 (two) times daily., Disp: 30 mL, Rfl: 5 .  estradiol (CLIMARA - DOSED IN MG/24 HR) 0.075 mg/24hr patch, APPLY ONE PATCH ONCE WEEKLY.,  Disp: 12 patch, Rfl: 4 .  Melatonin 1 MG TABS, Take by mouth., Disp: , Rfl:  .  Misc Natural Products (DEEP SLEEP PO), Take by mouth., Disp: , Rfl:  .  Multiple Vitamins-Minerals (HAIR/SKIN/NAILS PO), Take by mouth., Disp: , Rfl:  .  progesterone (PROMETRIUM) 100 MG capsule, TAKE 1 CAPSULE AT BEDTIME., Disp: 90 capsule, Rfl: 4 .  tretinoin (RETIN-A) 0.05 % cream, Apply topically at bedtime., Disp: 45 g, Rfl: 5  EXAM:  Vitals:   10/01/15 1447  BP: 92/62  Pulse: 80  Temp: 98.3 F (36.8 C)    Body mass index is 20.58 kg/m.  GENERAL: vitals reviewed and listed above, alert, oriented, appears well hydrated and in no acute distress  HEENT: atraumatic, conjunttiva clear, no obvious abnormalities on inspection of external nose and ears  NECK: no obvious masses on inspection  MS: moves all extremities without noticeable abnormality; normal inspection both LEs and knees except for skin findings (see below); no swelling or redness of knees; TTP around the knee cap L, normal ROM both knees, +patellar crep L>R, + J sign L, neg lachman/drawer/mcmurry/val/var stress  SKIN: small patch 4 moles L inner  upper leg above the knee - irr appearance to several  PSYCH: pleasant and cooperative, no obvious depression or anxiety  ASSESSMENT AND PLAN:  Discussed the following assessment and plan:  Left knee pain -we discussed possible serious and likely etiologies, workup and treatment, treatment risks and return precautions - suspect PFS -after this discussion, Regina Mendez opted for HEP, prn analgesics and ice, activity modification -follow up advised 1 month -pt opted for derm eval moles and will schedule appt herself - number provided - advise to undergo eval in next 1-2 months or to see PCP -of course, we advised Regina Mendez  to return or notify a doctor immediately if symptoms worsen or persist or new concerns arise.    Patient Instructions  BEFORE YOU LEAVE: -follow up: in 1-2 months -patellofem  exercises  Do the exercises at least 4 days per week. Otherwise try to avoid repetitive squatting, lunging or stairs if they aggravate the symptoms.  Ice or aleve as needed per instructions for pain.   Patellofemoral Pain Syndrome Patellofemoral pain syndrome is a condition that involves a softening or breakdown of the tissue (cartilage) on the underside of your kneecap (patella). This causes pain in the front of the knee. The condition is also called runner's knee or chondromalacia patella. Patellofemoral pain syndrome is most common in young adults who are active in sports. Your knee is the largest joint in your body. The patella covers the front of your knee and is attached to muscles above and below your knee. The underside of the patella is covered with a smooth type of cartilage (synovium). The smooth surface helps the patella glide easily when you move your knee. Patellofemoral pain syndrome causes swelling in the joint linings and bone surfaces in your knee.  CAUSES  Patellofemoral pain syndrome can be caused by:  Overuse.  Poor alignment of your knee joints.  Weak leg muscles.  A direct blow to your kneecap. RISK FACTORS You may be at risk for patellofemoral pain syndrome if you:  Do a lot of activities that can wear down your kneecap. These include:  Running.  Squatting.  Climbing stairs.  Start a new physical activity or exercise program.  Wear shoes that do not fit well.  Do not have good leg strength.  Are overweight. SIGNS AND SYMPTOMS  Knee pain is the most common symptom of patellofemoral pain syndrome. This may feel like a dull, aching pain underneath your patella, in the front of your knee. There may be a popping or cracking sound when you move your knee. Pain may get worse with:  Exercise.  Climbing stairs.  Running.  Jumping.  Squatting.  Kneeling.  Sitting for a long time.  Moving or pushing on your patella. DIAGNOSIS  Your health care  provider may be able to diagnose patellofemoral pain syndrome from your symptoms and medical history. You may be asked about your recent physical activities and which ones cause knee pain. Your health care provider may do a physical exam with certain tests to confirm the diagnosis. These may include:  Moving your patella back and forth.  Checking your range of knee motion.  Having you squat or jump to see if you have pain.  Checking the strength of your leg muscles. An MRI of the knee may also be done. TREATMENT  Patellofemoral pain syndrome can usually be treated at home with rest, ice, compression, and elevation (RICE). Other treatments may include:  Nonsteroidal anti-inflammatory drugs (NSAIDs).  Physical therapy to stretch and strengthen  your leg muscles.  Shoe inserts (orthotics) to take stress off your knee.  A knee brace or knee support.  Surgery to remove damaged cartilage or move the patella to a better position. The need for surgery is rare. HOME CARE INSTRUCTIONS   Take medicines only as directed by your health care provider.  Rest your knee.  When resting, keep your knee raised above the level of your heart.  Avoid activities that cause knee pain.  Apply ice to the injured area:  Put ice in a plastic bag.  Place a towel between your skin and the bag.  Leave the ice on for 20 minutes, 2-3 times a day.  Use splints, braces, knee supports, or walking aids as directed by your health care provider.  Perform stretching and strengthening exercises as directed by your health care provider or physical therapist.  Keep all follow-up visits as directed by your health care provider. This is important. SEEK MEDICAL CARE IF:   Your symptoms get worse.  You are not improving with home care. MAKE SURE YOU:  Understand these instructions.  Will watch your condition.  Will get help right away if you are not doing well or get worse.   This information is not  intended to replace advice given to you by your health care provider. Make sure you discuss any questions you have with your health care provider.   Document Released: 01/05/2009 Document Revised: 02/07/2014 Document Reviewed: 04/08/2013 Elsevier Interactive Patient Education 2016 [employer]., DO

## 2015-10-01 NOTE — Patient Instructions (Signed)
BEFORE YOU LEAVE: -follow up: in 1-2 months -patellofem exercises  Do the exercises at least 4 days per week. Otherwise try to avoid repetitive squatting, lunging or stairs if they aggravate the symptoms.  Ice or aleve as needed per instructions for pain.   Patellofemoral Pain Syndrome Patellofemoral pain syndrome is a condition that involves a softening or breakdown of the tissue (cartilage) on the underside of your kneecap (patella). This causes pain in the front of the knee. The condition is also called runner's knee or chondromalacia patella. Patellofemoral pain syndrome is most common in young adults who are active in sports. Your knee is the largest joint in your body. The patella covers the front of your knee and is attached to muscles above and below your knee. The underside of the patella is covered with a smooth type of cartilage (synovium). The smooth surface helps the patella glide easily when you move your knee. Patellofemoral pain syndrome causes swelling in the joint linings and bone surfaces in your knee.  CAUSES  Patellofemoral pain syndrome can be caused by:  Overuse.  Poor alignment of your knee joints.  Weak leg muscles.  A direct blow to your kneecap. RISK FACTORS You may be at risk for patellofemoral pain syndrome if you:  Do a lot of activities that can wear down your kneecap. These include:  Running.  Squatting.  Climbing stairs.  Start a new physical activity or exercise program.  Wear shoes that do not fit well.  Do not have good leg strength.  Are overweight. SIGNS AND SYMPTOMS  Knee pain is the most common symptom of patellofemoral pain syndrome. This may feel like a dull, aching pain underneath your patella, in the front of your knee. There may be a popping or cracking sound when you move your knee. Pain may get worse with:  Exercise.  Climbing stairs.  Running.  Jumping.  Squatting.  Kneeling.  Sitting for a long time.  Moving or  pushing on your patella. DIAGNOSIS  Your health care provider may be able to diagnose patellofemoral pain syndrome from your symptoms and medical history. You may be asked about your recent physical activities and which ones cause knee pain. Your health care provider may do a physical exam with certain tests to confirm the diagnosis. These may include:  Moving your patella back and forth.  Checking your range of knee motion.  Having you squat or jump to see if you have pain.  Checking the strength of your leg muscles. An MRI of the knee may also be done. TREATMENT  Patellofemoral pain syndrome can usually be treated at home with rest, ice, compression, and elevation (RICE). Other treatments may include:  Nonsteroidal anti-inflammatory drugs (NSAIDs).  Physical therapy to stretch and strengthen your leg muscles.  Shoe inserts (orthotics) to take stress off your knee.  A knee brace or knee support.  Surgery to remove damaged cartilage or move the patella to a better position. The need for surgery is rare. HOME CARE INSTRUCTIONS   Take medicines only as directed by your health care provider.  Rest your knee.  When resting, keep your knee raised above the level of your heart.  Avoid activities that cause knee pain.  Apply ice to the injured area:  Put ice in a plastic bag.  Place a towel between your skin and the bag.  Leave the ice on for 20 minutes, 2-3 times a day.  Use splints, braces, knee supports, or walking aids as directed by  your health care provider.  Perform stretching and strengthening exercises as directed by your health care provider or physical therapist.  Keep all follow-up visits as directed by your health care provider. This is important. SEEK MEDICAL CARE IF:   Your symptoms get worse.  You are not improving with home care. MAKE SURE YOU:  Understand these instructions.  Will watch your condition.  Will get help right away if you are not doing  well or get worse.   This information is not intended to replace advice given to you by your health care provider. Make sure you discuss any questions you have with your health care provider.   Document Released: 01/05/2009 Document Revised: 02/07/2014 Document Reviewed: 04/08/2013 Elsevier Interactive Patient Education [employer].

## 2015-10-01 NOTE — Progress Notes (Signed)
Pre visit review using our clinic review tool, if applicable. No additional management support is needed unless otherwise documented below in the visit note. 

## 2015-11-02 ENCOUNTER — Ambulatory Visit (INDEPENDENT_AMBULATORY_CARE_PROVIDER_SITE_OTHER): Payer: BC Managed Care – PPO | Admitting: Family Medicine

## 2015-11-02 ENCOUNTER — Encounter: Payer: Self-pay | Admitting: Family Medicine

## 2015-11-02 VITALS — BP 110/60 | HR 70 | Temp 97.6°F | Wt 126.5 lb

## 2015-11-02 DIAGNOSIS — M25561 Pain in right knee: Secondary | ICD-10-CM | POA: Diagnosis not present

## 2015-11-02 DIAGNOSIS — Z23 Encounter for immunization: Secondary | ICD-10-CM

## 2015-11-02 DIAGNOSIS — M19041 Primary osteoarthritis, right hand: Secondary | ICD-10-CM | POA: Diagnosis not present

## 2015-11-02 DIAGNOSIS — M25562 Pain in left knee: Secondary | ICD-10-CM

## 2015-11-02 DIAGNOSIS — M19042 Primary osteoarthritis, left hand: Secondary | ICD-10-CM | POA: Diagnosis not present

## 2015-11-02 NOTE — Progress Notes (Signed)
Regina Mendez is a 55 year old female who comes in today for evaluation of multiple issues  She's been a month in [place] did walking 6-8 hours a day andsoreness in her knees. She has no swelling no locking no erythema. She says her left knee cracks a little bit when she walks sometimes. She does yoga. Weight is good 126. No history of any knee trauma  She also some nodules on her middle joints of a couple fingers. She thinks her mother had osteoarthritis  Her to 23 year old twins are doing better with her eating disorder.  Vital signs stable she's afebrile  Examination hand shows 2 small nodules middle joints  Examination of both knees in the sitting position shows no erythema ligaments cartilage is intact some chondromalacia of the left patella otherwise knees appear normal.total body skin exam normal. She has a garden variety of freckles Mollo's capillary hemangiomas and seborrheic keratoses  Impression #1 osteoarthritis right left knee.......Marland Kitchen Motrin ice and exercise  #2 osteoarthritis hands.......Marland Kitchen Motrin ice and exercise  #3.. Skin check normal

## 2015-11-02 NOTE — Patient Instructions (Signed)
Motrin 400 mg twice daily with food  Elevation and ice in the evening when you knees gets sore  Continue your exercise specifically the yoga  Set up your physical exam with Dr. Betty Martinique

## 2015-11-18 ENCOUNTER — Encounter: Payer: Self-pay | Admitting: Family Medicine

## 2015-11-18 ENCOUNTER — Ambulatory Visit (INDEPENDENT_AMBULATORY_CARE_PROVIDER_SITE_OTHER): Payer: BC Managed Care – PPO | Admitting: Family Medicine

## 2015-11-18 VITALS — BP 108/74 | HR 82 | Temp 98.2°F | Wt 128.1 lb

## 2015-11-18 DIAGNOSIS — R5383 Other fatigue: Secondary | ICD-10-CM | POA: Diagnosis not present

## 2015-11-18 DIAGNOSIS — Z808 Family history of malignant neoplasm of other organs or systems: Secondary | ICD-10-CM | POA: Diagnosis not present

## 2015-11-18 DIAGNOSIS — M25579 Pain in unspecified ankle and joints of unspecified foot: Secondary | ICD-10-CM | POA: Diagnosis not present

## 2015-11-18 LAB — TSH: TSH: 0.98 u[IU]/mL (ref 0.35–4.50)

## 2015-11-18 NOTE — Progress Notes (Signed)
Pre visit review using our clinic review tool, if applicable. No additional management support is needed unless otherwise documented below in the visit note. 

## 2015-11-18 NOTE — Progress Notes (Signed)
Blue is a 55 year old married female nonsmoker who comes in today for evaluation of multiple symptoms  She says she's having trouble with pain in her knees and she went to see Dr. been at [place]. She's been prescribed and exercise program. He does not understand why she has other joint pains. She complains of pain in her hands and her wrist and back. He did not funny structural abnormality. She's concerned she may have some type of arthritis.  She also canceled a trip [place] because of fatigue. She's concerned of her but her thyroid because her mother had a history of thyroid cancer.  She's had no fever chills nausea vomiting diarrhea weight loss etc.  It's difficult to define her joint pain. She does not have any redness swelling or objective findings.  Physical exam vital signs stable she's afebrile........Marland Kitchen joint exam shows no evidence of any abnormality deformity erythema etc. There is no muscle pain or point tenderness.  Examination neck in the standing position shows no evidence of any abnormality. On palpation the thyroid appears normal and symmetrical no abnormalities  Impression #1 diffuse joint pain etiology unknown.......Marland Kitchen rheumatology consult  #2 bilateral knee pain.....Marland Kitchen continue follow-up by orthopedist  #3 family history of thyroid cancer....... check thyroid level

## 2015-11-18 NOTE — Patient Instructions (Signed)
We will check your thyroid level today  We will arrange for rheumatology consult ASAP  In the meantime take 600 mg of Motrin twice daily with food and walk 30 minutes daily

## 2015-11-21 ENCOUNTER — Ambulatory Visit (INDEPENDENT_AMBULATORY_CARE_PROVIDER_SITE_OTHER): Payer: BC Managed Care – PPO | Admitting: Family Medicine

## 2015-11-21 ENCOUNTER — Encounter: Payer: Self-pay | Admitting: Family Medicine

## 2015-11-21 VITALS — BP 110/72 | HR 87 | Temp 98.9°F | Resp 17 | Ht 65.5 in | Wt 129.0 lb

## 2015-11-21 DIAGNOSIS — J029 Acute pharyngitis, unspecified: Secondary | ICD-10-CM | POA: Diagnosis not present

## 2015-11-21 DIAGNOSIS — J358 Other chronic diseases of tonsils and adenoids: Secondary | ICD-10-CM | POA: Diagnosis not present

## 2015-11-21 NOTE — Patient Instructions (Addendum)
The white area on the back of your throat appears to be a tonsilith or a normal variant. This does not appear to be concerning or infectious.  Based on your recent fatigue and joint pain, I will check a strep test and strep throat culture today, but currently does not appear to be strep throat. If any worsening of her symptoms, return here or discuss with your other primary care provider.   Your symptoms may be due to allergies, can try over-the-counter Zyrtec, Claritin, or Allegra once per day.  Flonase nasal spray may also be beneficial. Let me know if you have any questions    Sore Throat A sore throat is pain, burning, irritation, or scratchiness of the throat. There is often pain or tenderness when swallowing or talking. A sore throat may be accompanied by other symptoms, such as coughing, sneezing, fever, and swollen neck glands. A sore throat is often the first sign of another sickness, such as a cold, flu, strep throat, or mononucleosis (commonly known as mono). Most sore throats go away without medical treatment. CAUSES  The most common causes of a sore throat include:  A viral infection, such as a cold, flu, or mono.  A bacterial infection, such as strep throat, tonsillitis, or whooping cough.  Seasonal allergies.  Dryness in the air.  Irritants, such as smoke or pollution.  Gastroesophageal reflux disease (GERD). HOME CARE INSTRUCTIONS   Only take over-the-counter medicines as directed by your caregiver.  Drink enough fluids to keep your urine clear or pale yellow.  Rest as needed.  Try using throat sprays, lozenges, or sucking on hard candy to ease any pain (if older than 4 years or as directed).  Sip warm liquids, such as broth, herbal tea, or warm water with honey to relieve pain temporarily. You may also eat or drink cold or frozen liquids such as frozen ice pops.  Gargle with salt water (mix 1 tsp salt with 8 oz of water).  Do not smoke and avoid secondhand  smoke.  Put a cool-mist humidifier in your bedroom at night to moisten the air. You can also turn on a hot shower and sit in the bathroom with the door closed for 5-10 minutes. SEEK IMMEDIATE MEDICAL CARE IF:  You have difficulty breathing.  You are unable to swallow fluids, soft foods, or your saliva.  You have increased swelling in the throat.  Your sore throat does not get better in 7 days.  You have nausea and vomiting.  You have a fever or persistent symptoms for more than 2-3 days.  You have a fever and your symptoms suddenly get worse. MAKE SURE YOU:   Understand these instructions.  Will watch your condition.  Will get help right away if you are not doing well or get worse.   This information is not intended to replace advice given to you by your health care provider. Make sure you discuss any questions you have with your health care provider.   Document Released: 02/25/2004 Document Revised: 02/07/2014 Document Reviewed: 09/25/2011 Elsevier Interactive Patient Education 2016 [employer].   IF you received an x-ray today, you will receive an invoice from [facility]. Please contact [facility] at [phone] with questions or concerns regarding your invoice.   IF you received labwork today, you will receive an invoice from [employer]. Please contact [facility] at [phone] with questions or concerns regarding your invoice.   Our billing staff will not be able to assist you with  questions regarding bills from these companies.  You will be contacted with the lab results as soon as they are available. The fastest way to get your results is to activate your My Chart account. Instructions are located on the last page of this paperwork. If you have not heard from [place] regarding the results in 2 weeks, please contact this office.

## 2015-11-21 NOTE — Progress Notes (Signed)
Subjective:  By signing my name below, I, [name], attest that this documentation has been prepared under the direction and in the presence of [name], MD. Electronically Signed: [name], [place]. 11/21/2015 , 3:05 PM .  Patient was seen in Room 11 .   Patient ID: Regina Mendez, female    DOB: 01/14/1961, 55 y.o.   MRN: [id number] Chief Complaint  Patient presents with  . Sore Throat    Pt states "white bump on throat" Started today.    HPI Regina Mendez is a 55 y.o. female Here for sore throat.   Patient has had discomfort with some pressure in her throat ongoing for the past week. She thought it was just sore throat, but this morning, she looked in the mirror and noticed a small white spot in the back of her throat. She's also been feeling fatigue for the past 3 weeks. She denies night sweats, fever or chill. She denies taking anything for her allergies right now.   She also mentions having increased joint problems for about 2 weeks now. She informed her PCP, TODD,Nnaemeka Samson ALLEN, MD, 3 days ago regarding this. She plans to see a rheumatologist for follow up.   Patient Active Problem List   Diagnosis Date Noted  . Routine general medical examination at a health care facility 01/14/2014  . Chronic insomnia 10/26/2012  . Joint pain 08/16/2011  . Cervical dysplasia   . Vertigo 09/27/2010  . 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 Skin rash 08/02/2010  . ALLERGIC RHINITIS 09/07/2009  . LIPOMA 06/26/2009  . SHOULDER PAIN, LEFT 12/17/2008  . RECTAL BLEEDING, HX OF 12/17/2008  . MIGRAINE HEADACHE 07/05/2007  . LUMP OR MASS IN BREAST 07/05/2007  . MENOPAUSE, EARLY 07/05/2007   Past Medical History:  Diagnosis Date  . Acne   . Breast cyst   . Migraine headache    Past Surgical History:  Procedure Laterality Date  . APPENDECTOMY    . BREAST SURGERY     right breast bx  . COLPOSCOPY    . GYNECOLOGIC CRYOSURGERY  age 41  . OVARIAN CYST SURGERY    .  TONSILLECTOMY     Allergies  Allergen Reactions  . Codeine Other (See Comments)    Drowsy  . Sulfonamide Derivatives Dermatitis   Prior to Admission medications   Medication Sig Start Date End Date Taking? Authorizing Provider  albuterol (PROVENTIL HFA;VENTOLIN HFA) 108 (90 BASE) MCG/ACT inhaler Inhale 2 puffs into the lungs every 6 (six) hours as needed.     Yes Historical Provider, MD  aspirin 81 MG tablet Take 81 mg by mouth daily.     Yes Historical Provider, MD  beclomethasone (QVAR) 40 MCG/ACT inhaler Inhale 2 puffs into the lungs 2 (two) times daily.     Yes Historical Provider, MD  BIOTIN PO Take by mouth.   Yes Historical Provider, MD  clindamycin (CLEOCIN-T) 1 % external solution Apply topically 2 (two) times daily. 01/14/14  Yes Dorena Cookey, MD  estradiol (CLIMARA - DOSED IN MG/24 HR) 0.075 mg/24hr patch APPLY ONE PATCH ONCE WEEKLY. 04/07/15  Yes [name], MD  Melatonin 1 MG TABS Take by mouth.   Yes Historical Provider, MD  Misc Natural Products (DEEP SLEEP PO) Take by mouth.   Yes Historical Provider, MD  Multiple Vitamins-Minerals (HAIR/SKIN/NAILS PO) Take by mouth.   Yes Historical Provider, MD  progesterone (PROMETRIUM) 100 MG capsule TAKE 1 CAPSULE AT BEDTIME. 04/07/15  Yes [name], MD  tretinoin (RETIN-A) 0.05 % cream Apply topically at bedtime. 01/14/14  Yes Dorena Cookey, MD   Social History   Social History  . Marital status: Married    Spouse name: N/A  . Number of children: N/A  . Years of education: N/A   Occupational History  . Not on file.   Social History Main Topics  . Smoking status: Never Smoker  . Smokeless tobacco: Never Used  . Alcohol use 0.6 oz/week    1 Standard drinks or equivalent per week  . Drug use: No  . Sexual activity: Yes    Birth control/ protection: Post-menopausal     Comment: 1st intercourse 55 yo-More than 5 partners   Other Topics Concern  . Not on file   Social History Narrative  . No narrative on  file   Review of Systems  Constitutional: Negative for chills, diaphoresis, fatigue and fever.  HENT: Positive for sore throat. Negative for congestion and voice change.   Respiratory: Negative for cough, shortness of breath and wheezing.   Gastrointestinal: Negative for diarrhea, nausea and vomiting.  Musculoskeletal: Positive for arthralgias.       Objective:   Physical Exam  Constitutional: She is oriented to person, place, and time. She appears well-developed and well-nourished. No distress.  HENT:  Head: Normocephalic and atraumatic.  Right Ear: Hearing, tympanic membrane, external ear and ear canal normal.  Left Ear: Hearing, tympanic membrane, external ear and ear canal normal.  Nose: Nose normal.  Mouth/Throat: Oropharynx is clear and moist. No oropharyngeal exudate.  Small tonsillith on the right tonsil, but no exudate or significant erythema; no tonsillary recess  Eyes: Conjunctivae and EOM are normal. Pupils are equal, round, and reactive to light.  Cardiovascular: Normal rate, regular rhythm, normal heart sounds and intact distal pulses.   No murmur heard. Pulmonary/Chest: Effort normal and breath sounds normal. No respiratory distress. She has no wheezes. She has no rhonchi.  Lymphadenopathy:    She has no cervical adenopathy.  Neurological: She is alert and oriented to person, place, and time.  Skin: Skin is warm and dry. No rash noted.  Psychiatric: She has a normal mood and affect. Her behavior is normal.  Vitals reviewed.   Vitals:   11/21/15 1444  BP: 110/72  Pulse: 87  Resp: 17  Temp: 98.9 F (37.2 C)  TempSrc: Oral  SpO2: 99%  Weight: 129 lb (58.5 kg)  Height: 5' 5.5" (1.664 m)       Assessment & Plan:   Regina Mendez is a 55 y.o. female Sore throat Tonsillith  - Possible allergic versus viral symptoms. Unlikely strep throat as afebrile, no lymphadenopathy.  Strep test/culture was collected, but does not appear to have been ordered during  visit. Will contact patient to have her come in to repeat that test Monday if needed. Again does not appear to be strep throat, and white spot on throat appears to be a tonsillith, not exudate. RTC precautions, symptomatic care for possible allergy symptoms.  No orders of the defined types were placed in this encounter.  Patient Instructions    The white area on the back of your throat appears to be a tonsilith or a normal variant. This does not appear to be concerning or infectious.  Based on your recent fatigue and joint pain, I will check a strep test and strep throat culture today, but currently does not appear to be strep throat. If any worsening of her symptoms, return here or discuss with your  other primary care provider.   Your symptoms may be due to allergies, can try over-the-counter Zyrtec, Claritin, or Allegra once per day.  Flonase nasal spray may also be beneficial. Let me know if you have any questions    Sore Throat A sore throat is pain, burning, irritation, or scratchiness of the throat. There is often pain or tenderness when swallowing or talking. A sore throat may be accompanied by other symptoms, such as coughing, sneezing, fever, and swollen neck glands. A sore throat is often the first sign of another sickness, such as a cold, flu, strep throat, or mononucleosis (commonly known as mono). Most sore throats go away without medical treatment. CAUSES  The most common causes of a sore throat include:  A viral infection, such as a cold, flu, or mono.  A bacterial infection, such as strep throat, tonsillitis, or whooping cough.  Seasonal allergies.  Dryness in the air.  Irritants, such as smoke or pollution.  Gastroesophageal reflux disease (GERD). HOME CARE INSTRUCTIONS   Only take over-the-counter medicines as directed by your caregiver.  Drink enough fluids to keep your urine clear or pale yellow.  Rest as needed.  Try using throat sprays, lozenges, or sucking  on hard candy to ease any pain (if older than 4 years or as directed).  Sip warm liquids, such as broth, herbal tea, or warm water with honey to relieve pain temporarily. You may also eat or drink cold or frozen liquids such as frozen ice pops.  Gargle with salt water (mix 1 tsp salt with 8 oz of water).  Do not smoke and avoid secondhand smoke.  Put a cool-mist humidifier in your bedroom at night to moisten the air. You can also turn on a hot shower and sit in the bathroom with the door closed for 5-10 minutes. SEEK IMMEDIATE MEDICAL CARE IF:  You have difficulty breathing.  You are unable to swallow fluids, soft foods, or your saliva.  You have increased swelling in the throat.  Your sore throat does not get better in 7 days.  You have nausea and vomiting.  You have a fever or persistent symptoms for more than 2-3 days.  You have a fever and your symptoms suddenly get worse. MAKE SURE YOU:   Understand these instructions.  Will watch your condition.  Will get help right away if you are not doing well or get worse.   This information is not intended to replace advice given to you by your health care provider. Make sure you discuss any questions you have with your health care provider.   Document Released: 02/25/2004 Document Revised: 02/07/2014 Document Reviewed: 09/25/2011 Elsevier Interactive Patient Education 2016 [employer].   IF you received an x-ray today, you will receive an invoice from [facility]. Please contact [facility] at [phone] with questions or concerns regarding your invoice.   IF you received labwork today, you will receive an invoice from [employer]. Please contact [facility] at [phone] with questions or concerns regarding your invoice.   Our billing staff will not be able to assist you with questions regarding bills from these companies.  You will be contacted with the lab results as soon as  they are available. The fastest way to get your results is to activate your My Chart account. Instructions are located on the last page of this paperwork. If you have not heard from [place] regarding the results in 2 weeks, please contact this office.  I personally performed the services described in this documentation, which was scribed in my presence. The recorded information has been reviewed and considered, and addended by me as needed.   Signed,   [name], MD [facility].  11/22/15 10:16 PM

## 2015-11-23 ENCOUNTER — Telehealth: Payer: Self-pay

## 2015-11-23 NOTE — Telephone Encounter (Signed)
Pt seen at [place] on 11/21/2015  Patient Name: Regina Mendez Gender: Female DOB: 11-23-1960 Age: 55 Y 41 M 6 D Return Phone Number: [phone] (Primary) Address: City/State/Zip: [place] Client [facility] - Client Client Site [facility] - Night Physician Christie Nottingham - MD Contact Type Call Who Is Calling Patient / Member / Family / Caregiver Call Type Triage / Clinical Relationship To Patient Self Return Phone Number [phone] (Primary) Chief Complaint Skin Lesion - Moles/ Lumps/ Growths Reason for Call Symptomatic / Request for [place] has a white lump in her throat. PreDisposition Go to Urgent Care/Walk-In Clinic Translation No Nurse Assessment Nurse: Mills-Hernandez, RN, Izora Gala Date/Time (Eastern Time): 11/21/2015 10:52:13 AM Confirm and document reason for call. If symptomatic, describe symptoms. You must click the next button to save text entered. ---Caller states that she has a small white pump on the right side of her throat. Has the patient traveled out of the country within the last 30 days? ---No Does the patient have any new or worsening symptoms? ---Yes Will a triage be completed? ---Yes Related visit to physician within the last 2 weeks? ---Yes Does the PT have any chronic conditions? (i.e. diabetes, asthma, etc.) ---No Is the patient pregnant or possibly pregnant? (Ask all females between the ages of 21-55) ---No Is this a behavioral health or substance abuse call? ---No Guidelines Guideline Title Affirmed Question Affirmed Notes Nurse Date/Time Eilene Ghazi Time) Mouth Symptoms Painless lump in mouth Mills-Hernandez, RN, Izora Gala 11/21/2015 10:54:42 AM Disp. Time Eilene Ghazi Time) Disposition Final User 11/21/2015 10:42:44 AM Attempt made - no message left Mills-Hernandez, RN, Izora Gala 11/21/2015 10:58:04 AM Call Dentist when Office is Open Yes Mills-Hernandez, RN, Izora Gala

## 2015-11-25 ENCOUNTER — Telehealth: Payer: Self-pay | Admitting: Family Medicine

## 2015-11-25 NOTE — Telephone Encounter (Signed)
Called patient. No fever, improved sx's. May be some allergies with sore throat. Advised that strep test had not been run, but based on exam and sx's last visit - low likelihood of strep.  If not continuing to improve - can recheck in next few days or lab only order for strep if needed. She will call or Mychart message me if that is the case.

## 2015-11-28 ENCOUNTER — Ambulatory Visit (INDEPENDENT_AMBULATORY_CARE_PROVIDER_SITE_OTHER): Payer: BC Managed Care – PPO | Admitting: Urgent Care

## 2015-11-28 VITALS — BP 102/60 | HR 77 | Temp 98.6°F | Resp 16 | Ht 65.5 in | Wt 127.0 lb

## 2015-11-28 DIAGNOSIS — R5381 Other malaise: Secondary | ICD-10-CM

## 2015-11-28 DIAGNOSIS — M2550 Pain in unspecified joint: Secondary | ICD-10-CM | POA: Diagnosis not present

## 2015-11-28 DIAGNOSIS — R457 State of emotional shock and stress, unspecified: Secondary | ICD-10-CM

## 2015-11-28 DIAGNOSIS — R5383 Other fatigue: Secondary | ICD-10-CM

## 2015-11-28 DIAGNOSIS — J358 Other chronic diseases of tonsils and adenoids: Secondary | ICD-10-CM | POA: Diagnosis not present

## 2015-11-28 DIAGNOSIS — J029 Acute pharyngitis, unspecified: Secondary | ICD-10-CM | POA: Diagnosis not present

## 2015-11-28 LAB — POCT RAPID STREP A (OFFICE): RAPID STREP A SCREEN: NEGATIVE

## 2015-11-28 NOTE — Patient Instructions (Addendum)
I recommend that you rest and decrease your stress. You have a lot of responsibilities that you are taking on and I suspect that they are taking a toll on your mental and physical health. Please make sure that you are eating regular balanced meals (at least 3 per day), hydrating with at least 2 liters of water per day. Exercising 2-3 times per week for 45 minutes to 1 hour may help improve your health and mood overall. Also make sure that you are sleeping at least 7-8 hours a night. Please keep your appointments to address your other symptoms with physical therapy and rheumatology.   Stress and Stress Management Stress is a normal reaction to life events. It is what you feel when life demands more than you are used to or more than you can handle. Some stress can be useful. For example, the stress reaction can help you catch the last bus of the day, study for a test, or meet a deadline at work. But stress that occurs too often or for too long can cause problems. It can affect your emotional health and interfere with relationships and normal daily activities. Too much stress can weaken your immune system and increase your risk for physical illness. If you already have a medical problem, stress can make it worse. CAUSES  All sorts of life events may cause stress. An event that causes stress for one person may not be stressful for another person. Major life events commonly cause stress. These may be positive or negative. Examples include losing your job, moving into a new home, getting married, having a baby, or losing a loved one. Less obvious life events may also cause stress, especially if they occur day after day or in combination. Examples include working long hours, driving in traffic, caring for children, being in debt, or being in a difficult relationship. SIGNS AND SYMPTOMS Stress may cause emotional symptoms including, the following:  Anxiety. This is feeling worried, afraid, on edge, overwhelmed, or  out of control.  Anger. This is feeling irritated or impatient.  Depression. This is feeling sad, down, helpless, or guilty.  Difficulty focusing, remembering, or making decisions. Stress may cause physical symptoms, including the following:   Aches and pains. These may affect your head, neck, back, stomach, or other areas of your body.  Tight muscles or clenched jaw.  Low energy or trouble sleeping. Stress may cause unhealthy behaviors, including the following:   Eating to feel better (overeating) or skipping meals.  Sleeping too little, too much, or both.  Working too much or putting off tasks (procrastination).  Smoking, drinking alcohol, or using drugs to feel better. DIAGNOSIS  Stress is diagnosed through an assessment by your health care provider. Your health care provider will ask questions about your symptoms and any stressful life events.Your health care provider will also ask about your medical history and may order blood tests or other tests. Certain medical conditions and medicine can cause physical symptoms similar to stress. Mental illness can cause emotional symptoms and unhealthy behaviors similar to stress. Your health care provider may refer you to a mental health professional for further evaluation.  TREATMENT  Stress management is the recommended treatment for stress.The goals of stress management are reducing stressful life events and coping with stress in healthy ways.  Techniques for reducing stressful life events include the following:  Stress identification. Self-monitor for stress and identify what causes stress for you. These skills may help you to avoid some stressful events.  Time management. Set your priorities, keep a calendar of events, and learn to say "no." These tools can help you avoid making too many commitments. Techniques for coping with stress include the following:  Rethinking the problem. Try to think realistically about stressful events  rather than ignoring them or overreacting. Try to find the positives in a stressful situation rather than focusing on the negatives.  Exercise. Physical exercise can release both physical and emotional tension. The key is to find a form of exercise you enjoy and do it regularly.  Relaxation techniques. These relax the body and mind. Examples include yoga, meditation, tai chi, biofeedback, deep breathing, progressive muscle relaxation, listening to music, being out in nature, journaling, and other hobbies. Again, the key is to find one or more that you enjoy and can do regularly.  Healthy lifestyle. Eat a balanced diet, get plenty of sleep, and do not smoke. Avoid using alcohol or drugs to relax.  Strong support network. Spend time with family, friends, or other people you enjoy being around.Express your feelings and talk things over with someone you trust. Counseling or talktherapy with a mental health professional may be helpful if you are having difficulty managing stress on your own. Medicine is typically not recommended for the treatment of stress.Talk to your health care provider if you think you need medicine for symptoms of stress. HOME CARE INSTRUCTIONS  Keep all follow-up visits as directed by your health care provider.  Take all medicines as directed by your health care provider. SEEK MEDICAL CARE IF:  Your symptoms get worse or you start having new symptoms.  You feel overwhelmed by your problems and can no longer manage them on your own. SEEK IMMEDIATE MEDICAL CARE IF:  You feel like hurting yourself or someone else.   This information is not intended to replace advice given to you by your health care provider. Make sure you discuss any questions you have with your health care provider.   Document Released: 07/13/2000 Document Revised: 02/07/2014 Document Reviewed: 09/11/2012 Elsevier Interactive Patient Education 2016 [place] An allergy is an  abnormal reaction to a substance by the body's defense system (immune system). Allergies can develop at any age. WHAT CAUSES ALLERGIES? An allergic reaction happens when the immune system mistakenly reacts to a normally harmless substance, called an allergen, as if it were harmful. The immune system releases antibodies to fight the substance. Antibodies eventually release a chemical called histamine into the bloodstream. The release of histamine is meant to protect the body from infection, but it also causes discomfort. An allergic reaction can be triggered by:  Eating an allergen.  Inhaling an allergen.  Touching an allergen. WHAT TYPES OF ALLERGIES ARE THERE? There are many types of allergies. Common types include:  Seasonal allergies. People with this type of allergy are usually allergic to substances that are only present during certain seasons, such as molds and pollens.  Food allergies.  Drug allergies.  Insect allergies.  Animal dander allergies. WHAT ARE SYMPTOMS OF ALLERGIES? Possible allergy symptoms include:  Swelling of the lips, face, tongue, mouth, or throat.  Sneezing, coughing, or wheezing.  Nasal congestion.  Tingling in the mouth.  Rash.  Itching.  Itchy, red, swollen areas of skin (hives).  Watery eyes.  Vomiting.  Diarrhea.  Dizziness.  Lightheadedness.  Fainting.  Trouble breathing or swallowing.  Chest tightness.  Rapid heartbeat. HOW ARE ALLERGIES DIAGNOSED? Allergies are diagnosed with a medical and family history and one  or more of the following:  Skin tests.  Blood tests.  A food diary. A food diary is a record of all the foods and drinks you have in a day and of all the symptoms you experience.  The results of an elimination diet. An elimination diet involves eliminating foods from your diet and then adding them back in one by one to find out if a certain food causes an allergic reaction. HOW ARE ALLERGIES TREATED? There is  no cure for allergies, but allergic reactions can be treated with medicine. Severe reactions usually need to be treated at a hospital. HOW CAN REACTIONS BE PREVENTED? The best way to prevent an allergic reaction is by avoiding the substance you are allergic to. Allergy shots and medicines can also help prevent reactions in some cases. People with severe allergic reactions may be able to prevent a life-threatening reaction called anaphylaxis with a medicine given right after exposure to the allergen.   This information is not intended to replace advice given to you by your health care provider. Make sure you discuss any questions you have with your health care provider.   Document Released: 04/12/2002 Document Revised: 02/07/2014 Document Reviewed: 10/29/2013 Elsevier Interactive Patient Education 2016 [employer].    IF you received an x-ray today, you will receive an invoice from [facility]. Please contact [facility] at [phone] with questions or concerns regarding your invoice.   IF you received labwork today, you will receive an invoice from [employer]. Please contact [facility] at [phone] with questions or concerns regarding your invoice.   Our billing staff will not be able to assist you with questions regarding bills from these companies.  You will be contacted with the lab results as soon as they are available. The fastest way to get your results is to activate your My Chart account. Instructions are located on the last page of this paperwork. If you have not heard from [place] regarding the results in 2 weeks, please contact this office.

## 2015-11-28 NOTE — Progress Notes (Signed)
Patient ID: Regina Mendez, female   DOB: 1960/07/25, 55 y.o.   MRN: [id number]  By signing my name below I, Regina Mendez, attest that this documentation has been prepared under the direction and in the presence of Regina Mendez, [place]. Electonically Signed. Regina Mendez, Scribe 11/28/2015 at 2:14 PM   MRN: [id number] DOB: March 06, 1960  Subjective:   [name] is a 55 y.o. female presenting for follow up on sore throat for the past 3 weeks. Pt initialy seen and evaluated at [facility] on 11/21/15 for the same chief complaint. Thought to have allergies vs viral illness. Unfortunately strep testing was ordered but not performed for unknown reason at that time. Dr Carlota Raspberry called pt on 11/25/15 and pt reported having improved symptoms at phone call. Today, pt reports sinus congestion, fatigue, bilat knee pain, and not sleeping well, general malaise. Denies fever, sinus pain, HA, cough, CP, SOB, N/V, abdominal pain, rash, ear pain, or change in hearing. Admits history of allergies in the past, had to take allergy shots for this. Of note, she also reports increased stress due to daughters with mental health issues, anorexia. She stopped following up with her allergist due to focusing on her daughter's care and management of anorexia. Her husband has been "very demanding" at home for the past 2 months and has patient performing a lot of tasks at home. Knee pain started after she walked miles everyday while travelling in [place] this summer. Seen by orthopedist for knee pain. Pt reported joint pains in knees, hands, shoulders to orthopedist who referred pt to rheumatologist. Plans on following up with her rheumatologist to evaluate possible sources of fatigue and joint pain.   Emberlynn has a current medication list which includes the following prescription(s): albuterol, aspirin, beclomethasone, biotin, clindamycin, estradiol, melatonin, misc natural products, biotin w/ vitamins c & e, progesterone, and tretinoin. Also is  allergic to codeine and sulfonamide derivatives.  [place]  has a past medical history of Acne; Breast cyst; and Migraine headache. Also reporting history of seasonal allergies. Also  has a past surgical history that includes Tonsillectomy; Appendectomy; Breast surgery; Colposcopy; Ovarian cyst surgery; and Gynecologic cryosurgery (age 11).   Objective:   Vitals: BP 102/60    Pulse 77    Temp 98.6 F (37 C) (Oral)    Resp 16    Ht 5' 5.5" (1.664 m)    Wt 127 lb (57.6 kg)    LMP 08/13/2012    SpO2 98%    BMI 20.81 kg/m   Physical Exam  Constitutional: She is oriented to person, place, and time. She appears well-developed and well-nourished.  HENT:  TM's intact bilaterally, no effusions or erythema. Nasal turbinates pink and moist, nasal passages patent. Mild bilat maxillary and frontal sinus tenderness. Oropharynx with mild postnasal drainage, mucous membranes moist, dentition in good repair.  Eyes: Right eye exhibits no discharge. Left eye exhibits no discharge. No scleral icterus.  Cardiovascular: Normal rate, regular rhythm and intact distal pulses.  Exam reveals no gallop and no friction rub.   No murmur heard. Pulmonary/Chest: No respiratory distress. She has no wheezes. She has no rales.  Lymphadenopathy:    She has no cervical adenopathy.  Neurological: She is alert and oriented to person, place, and time.  Skin: Skin is warm and dry.   Results for orders placed or performed in visit on 11/28/15 (from the past 24 hour(s))  POCT rapid strep A     Status: None   Collection Time: 11/28/15  2:08 PM  Result Value Ref Range   Rapid Strep A Screen Negative Negative   Assessment and Plan :   1. Sore throat 2. Tonsillith - Strep test was negative, culture is pending. Patient may be having allergies, advised she restart oral antihistamine. RTC if no improvement.  3. Malaise and fatigue 4. Emotional stress 5. Multiple joint pain - Keep appointments with PT, ortho, rheumatology. I  advised patient take better care of herself as in patient instructions. Consider SSRI therapy. Follow up as needed.  Regina Eagles, PA-C [facility] [phone] 11/28/2015 2:13 PM

## 2015-11-29 LAB — CULTURE, GROUP A STREP: Organism ID, Bacteria: NORMAL

## 2015-12-02 ENCOUNTER — Encounter: Payer: Self-pay | Admitting: Urgent Care

## 2015-12-08 ENCOUNTER — Other Ambulatory Visit: Payer: Self-pay | Admitting: Gynecology

## 2015-12-08 DIAGNOSIS — Z9889 Other specified postprocedural states: Secondary | ICD-10-CM

## 2015-12-09 ENCOUNTER — Ambulatory Visit (INDEPENDENT_AMBULATORY_CARE_PROVIDER_SITE_OTHER): Payer: BC Managed Care – PPO | Admitting: Family Medicine

## 2015-12-09 ENCOUNTER — Telehealth: Payer: Self-pay

## 2015-12-09 VITALS — BP 130/80 | HR 92 | Temp 98.3°F | Ht 65.5 in | Wt 127.0 lb

## 2015-12-09 DIAGNOSIS — R937 Abnormal findings on diagnostic imaging of other parts of musculoskeletal system: Secondary | ICD-10-CM | POA: Diagnosis not present

## 2015-12-09 DIAGNOSIS — M130 Polyarthritis, unspecified: Secondary | ICD-10-CM | POA: Diagnosis not present

## 2015-12-09 NOTE — Telephone Encounter (Signed)
Pt called in to office and made appt with Dr [name] for this afternoon. She was advised of message from Dr Sherren Mocha, but refused to cancel appt. She had already called this morning and made the one with Dr Sherren Mocha for Monday. Pt now states that she wants to be seen for a "tick bite" that happened over the summer. Pt refused to cancel and stated that she will be seen today.  Nothing further needed at this time.

## 2015-12-09 NOTE — Telephone Encounter (Signed)
Per Dr Sherren Mocha pt does not need appt here for her joint pain. He would like her to continue to follow with rheumatology at [facility]. She has been seen there several times and they have done xrays and are working on getting an MRI approved and scheduled. He would like her to be seen there for the joint pain so that they can manage her symptoms and treatments. By seeing 2 doctors for the same problems, there would like be overlap in testing and treatments. It is always best to continue care with the same physician when possible.  LMTCB

## 2015-12-09 NOTE — Progress Notes (Signed)
Subjective:     Patient ID: Regina Mendez, female   DOB: 1960-06-27, 55 y.o.   MRN: [id number]  HPI Patient seen with polyarthralgias. She's had apparently similar issues in the past. This past July she was in [place] and had a very small (possibly deer) tick removed left side of neck. She went to physician in [place] and no further treatments recommended. No antibiotics given.  She's had migratory polyarthralgias involving multiple joints including hands, wrists, elbows, shoulders, knees, ankles. She saw a rheumatologist recently at [facility] and states that she had x-rays of her hands, knees, feet as well as some lab work and we have no records of that. She had the impression that she had borderline elevated rheumatoid factor. She does not recall any history of rash or fever following tick bite last summer. No prior history of Lyme disease.  Patient states her x-rays of the hand revealed cystic-appearing lesions of the right middle finger. She has not noted any obvious inflammatory changes such as redness or warmth.  She's been somewhat limited in exercise because of her polyarthralgias. She is under tremendous stress with 2 when daughters who have anorexia nervosa.  Past Medical History:  Diagnosis Date  . Acne   . Breast cyst   . Migraine headache    Past Surgical History:  Procedure Laterality Date  . APPENDECTOMY    . BREAST SURGERY     right breast bx  . COLPOSCOPY    . GYNECOLOGIC CRYOSURGERY  age 40  . OVARIAN CYST SURGERY    . TONSILLECTOMY      reports that she has never smoked. She has never used smokeless tobacco. She reports that she drinks about 0.6 oz of alcohol per week . She reports that she does not use drugs. family history includes Breast cancer in her cousin; Heart disease in her father; Hypertension in her father; Thyroid cancer in her mother. Allergies  Allergen Reactions  . Codeine Other (See Comments)    Drowsy  . Sulfonamide Derivatives Dermatitis      Review of Systems  Constitutional: Negative for appetite change, fever and unexpected weight change.  Respiratory: Negative for shortness of breath.   Cardiovascular: Negative for chest pain.  Gastrointestinal: Negative for abdominal pain.  Genitourinary: Negative for dysuria.  Musculoskeletal: Positive for arthralgias.  Skin: Negative for rash.  Neurological: Negative for dizziness.  Hematological: Negative for adenopathy. Does not bruise/bleed easily.       Objective:   Physical Exam  Constitutional: She appears well-developed and well-nourished.  Neck: Neck supple. No thyromegaly present.  Cardiovascular: Normal rate and regular rhythm.  Exam reveals no gallop.   Pulmonary/Chest: Effort normal and breath sounds normal. No respiratory distress. She has no wheezes. She has no rales.  Musculoskeletal: She exhibits no edema.  Extremities are examined with no obvious inflammatory changes or any localized tenderness  Lymphadenopathy:    She has no cervical adenopathy.  Neurological: She is alert.  Skin: No rash noted.       Assessment:     #1 polyarthralgias which have been somewhat intermittent and chronic. She is currently seeing rheumatologist we do not have results of recent labs or x-rays  #2 reported "cystic changes "right middle finger by plain films.    Plan:     -We recommended getting her recent x-ray results and labs for review. -Recommend continue follow-up with rheumatology -We discussed limitations of Lyme disease testing. We also do not have information regarding whether Lyme disease is endemic  to area that she was in. [place] try to get previous laboratory work first  Eulas Post MD [facility]

## 2015-12-09 NOTE — Patient Instructions (Signed)
Try to get records from [facility] regarding X-rays and recent labs We are happy to review them

## 2015-12-09 NOTE — Progress Notes (Signed)
Pre visit review using our clinic review tool, if applicable. No additional management support is needed unless otherwise documented below in the visit note. 

## 2015-12-12 ENCOUNTER — Other Ambulatory Visit: Payer: Self-pay | Admitting: Family Medicine

## 2015-12-12 ENCOUNTER — Ambulatory Visit (INDEPENDENT_AMBULATORY_CARE_PROVIDER_SITE_OTHER): Payer: BC Managed Care – PPO | Admitting: Family Medicine

## 2015-12-12 VITALS — BP 110/80 | HR 71 | Temp 98.4°F | Resp 16 | Ht 65.0 in | Wt 129.0 lb

## 2015-12-12 DIAGNOSIS — W57XXXA Bitten or stung by nonvenomous insect and other nonvenomous arthropods, initial encounter: Secondary | ICD-10-CM | POA: Diagnosis not present

## 2015-12-12 DIAGNOSIS — F439 Reaction to severe stress, unspecified: Secondary | ICD-10-CM

## 2015-12-12 DIAGNOSIS — M2550 Pain in unspecified joint: Secondary | ICD-10-CM | POA: Diagnosis not present

## 2015-12-12 MED ORDER — MELOXICAM 7.5 MG PO TABS: 7.5000 mg | ORAL_TABLET | Freq: Every day | ORAL | 0 refills | Status: DC

## 2015-12-12 NOTE — Progress Notes (Addendum)
Subjective:  By signing my name below, I, Regina Mendez, attest that this documentation has been prepared under the direction and in the presence of Regina Ray, MD. Electronically Signed: Moises Mendez, [place]. 12/12/2015 , 3:49 PM .  Patient was seen in Room 10 .   Patient ID: Regina Mendez, female    DOB: [birth year], 55 y.o.   MRN: [id number] Chief Complaint  Patient presents with  . Follow-up    x 2 months, joint pain    HPI Regina Mendez is a 55 y.o. female  Here for diffuse myalgias. Patient was last seen here for office visit on Oct 28th with Regina Eagles, PA-C for follow up of sore throat. She endorsed symptoms of sinus congestion, fatigue, bilateral knee pain, difficulty sleeping and generalized malaise. Per that note, there was some increased stress due to daughter's mental health issues and other medical problems. Apparently, she also has increased stress at home. She had reportedly saw ortho for knee pain after traveling to [place] last summer. She reportedly had pains in her knees, hands and shoulders. She was referred to rheumatologist by ortho. She's here for eval of similar symptoms. Prior notes were reviewed from our office and other offices.   She was seen by Dr. Elease Mendez on Nov 8th. Per that note, she reported removal of possible deer tick when she was in [place] in July 2016 with migratory polyarthralgia. She's followed by [facility] rheumatologist, including lab work and [name]. There was impression that she had borderline elevated rheumatoid factor.   Patient reports having continued persisted aches and pains in her joints (shoulders, elbows, hands, knees, ankles, and hips). She also informs having cystic-appearing lesion in her right middle finger. She's had 3 episodes of physical therapy with ortho. Her rheumatologist recommended MRI of right hand due to the cyst. However, patient wants to be referred to Dr. Trudie Mendez for second opinion. She denies  taking OTC medication for her symptoms.   Regarding her tick bite, she's had a fever about 2 weeks ago for 1 day. She denies any returning or further fever. She denies noticing rash on her skin since tick bite. She has continued feeling fatigue.   She feels her physical level does not feel normal to her. She's been going to sleep at 9:30PM daily due to feeling overall fatigue. She denies feeling depressed. She's still having stress with her daughter. She has therapist for couples counseling and counseling for her daughter; however, she has not seen her own therapist. She plans to leave out of town for Thanksgiving.   Patient Active Problem List   Diagnosis Date Noted  . Routine general medical examination at a health care facility 01/14/2014  . Chronic insomnia 10/26/2012  . Joint pain 08/16/2011  . Cervical dysplasia   . Vertigo 09/27/2010  . 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 Skin rash 08/02/2010  . ALLERGIC RHINITIS 09/07/2009  . LIPOMA 06/26/2009  . SHOULDER PAIN, LEFT 12/17/2008  . RECTAL BLEEDING, HX OF 12/17/2008  . MIGRAINE HEADACHE 07/05/2007  . LUMP OR MASS IN BREAST 07/05/2007  . MENOPAUSE, EARLY 07/05/2007   Past Medical History:  Diagnosis Date  . Acne   . Breast cyst   . Migraine headache    Past Surgical History:  Procedure Laterality Date  . APPENDECTOMY    . BREAST SURGERY     right breast bx  . COLPOSCOPY    . GYNECOLOGIC CRYOSURGERY  age 81  . OVARIAN CYST SURGERY    . TONSILLECTOMY  Allergies  Allergen Reactions  . Codeine Other (See Comments)    Drowsy  . Sulfonamide Derivatives Dermatitis   Prior to Admission medications   Medication Sig Start Date End Date Taking? Authorizing Provider  albuterol (PROVENTIL HFA;VENTOLIN HFA) 108 (90 BASE) MCG/ACT inhaler Inhale 2 puffs into the lungs every 6 (six) hours as needed.     Yes Historical Provider, MD  aspirin 81 MG tablet Take 81 mg by mouth daily.     Yes Historical Provider, MD    beclomethasone (QVAR) 40 MCG/ACT inhaler Inhale 2 puffs into the lungs 2 (two) times daily.     Yes Historical Provider, MD  BIOTIN PO Take by mouth.   Yes Historical Provider, MD  clindamycin (CLEOCIN-T) 1 % external solution Apply topically 2 (two) times daily. 01/14/14  Yes Regina Cookey, MD  estradiol (CLIMARA - DOSED IN MG/24 HR) 0.075 mg/24hr patch APPLY ONE PATCH ONCE WEEKLY. 04/07/15  Yes Regina Auerbach, MD  Melatonin 1 MG TABS Take by mouth.   Yes Historical Provider, MD  Misc Natural Products (DEEP SLEEP PO) Take by mouth.   Yes Historical Provider, MD  Multiple Vitamins-Minerals (HAIR/SKIN/NAILS PO) Take by mouth.   Yes Historical Provider, MD  progesterone (PROMETRIUM) 100 MG capsule TAKE 1 CAPSULE AT BEDTIME. 04/07/15  Yes Regina P Fontaine, MD  tretinoin (RETIN-A) 0.05 % cream Apply topically at bedtime. 01/14/14  Yes Regina Cookey, MD   Social History   Social History  . Marital status: Married    Spouse name: N/A  . Number of children: N/A  . Years of education: N/A   Occupational History  . Not on file.   Social History Main Topics  . Smoking status: Never Smoker  . Smokeless tobacco: Never Used  . Alcohol use 0.6 oz/week    1 Standard drinks or equivalent per week  . Drug use: No  . Sexual activity: Yes    Birth control/ protection: Post-menopausal     Comment: 1st intercourse 55 yo-More than 5 partners   Other Topics Concern  . Not on file   Social History Narrative  . No narrative on file   Review of Systems  Constitutional: Negative for chills, fatigue, fever and unexpected weight change.  Respiratory: Negative for cough.   Gastrointestinal: Negative for constipation, diarrhea, nausea and vomiting.  Musculoskeletal: Positive for arthralgias, joint swelling and myalgias.  Skin: Negative for rash and wound.  Neurological: Negative for dizziness, weakness and headaches.  Psychiatric/Behavioral: The patient is nervous/anxious.        Objective:    Physical Exam  Constitutional: She is oriented to person, place, and time. She appears well-developed and well-nourished. No distress.  HENT:  Head: Normocephalic and atraumatic.  Eyes: EOM are normal. Pupils are equal, round, and reactive to light.  Neck: Neck supple.  Cardiovascular: Normal rate.   Pulmonary/Chest: Effort normal. No respiratory distress.  Musculoskeletal: Normal range of motion.  Hands: full rom bilaterally, no focal joint swelling Elbows: full rom bilaterally Shoulders: full rom bilaterally Rotator cuff: normal upper extremities strength Knees: full rom bilaterally, no appreciable effusion or focal bony tenderness bilaterally  Neurological: She is alert and oriented to person, place, and time.  Skin: Skin is warm and dry.  Psychiatric: She has a normal mood and affect. Her behavior is normal.  Nursing note and vitals reviewed.   Vitals:   12/12/15 1512  BP: 110/80  Pulse: 71  Resp: 16  Temp: 98.4 F (36.9 C)  TempSrc: Oral  SpO2: 98%  Weight: 129 lb (58.5 kg)  Height: 5\' 5"  (1.651 m)      Assessment & Plan:   Many Maris is a 55 y.o. female Polyarthralgia - Plan: B. Burgdorfi Antibodies, Ambulatory referral to Rheumatology Tick bite, initial encounter - Plan: B. Burgdorfi Antibodies  -Currently under care of a rheumatologist for her polyarthralgias. other notes were reviewed. Reassuring exam in office, including no rash, no apparent joint swelling. Agreed to check Lyme testing, but very low likelihood with no initial rash and the tick bite was in an area that is not known apparently to have Lyme disease. Discussed the complex nature of Lyme disease testing and need for confirmatory testing of the initial test was positive. Differential could possibly include fibromyalgia given multiple locations of pain and concurrent stress/anxiety symptoms.  -Refer to Dr. Trudie Mendez, rheumatologist, for a second opinion at patient request. Records requested from previous  rheumatologist to review.  -Trial of meloxicam 7.5 mg daily for now  - addendum: Records reviewed from [place] from Mendez draw on October 27. Rheumatoid factor slightly elevated at 15.7. CRP normal T3 normal, slight elevated monocytes but otherwise CBC normal, CMP overall normal, sedimentation rate normal at 9, TSH normal at 1.18, vitamin D borderline low 26.6, CCP normal at 15. Right hand x-Mendez sclerotic lucency noted in distal radius scaphoid and proximal portion of the third middle phalanx possible enchondroma or simple bone cyst  Situational stress  -Phone number provided for counseling for individual counseling although she is attending counseling with her daughters for their concerns recently. Advised this may also affect her current pains or be affected by her pains.  Meds ordered this encounter  Medications  . meloxicam (MOBIC) 7.5 MG tablet    Sig: Take 1 tablet (7.5 mg total) by mouth daily.    Dispense:  30 tablet    Refill:  0   Patient Instructions   I will check a Lyme test as discussed, but other testing may be needed if this is elevated to clarify if significant or not. I did place referral to other rheumatologist as requested, but would try meloxicam once per day for now to help with aches and pains as that also can help other areas of inflammation. See numbers below for therapists that may help with some of your symptoms or discussing your other stressors.   Return to the clinic or go to the nearest emergency room if any of your symptoms worsen or new symptoms occur.   Vivia Budge: [id number] [place] [id number] [phone]  Joint Pain Joint pain, which is also called arthralgia, can be caused by many things. Joint pain often goes away when you follow your health care provider's instructions for relieving pain at home. However, joint pain can also be caused by conditions that require further treatment. Common causes of joint pain include:  Bruising in the area of the  joint.  Overuse of the joint.  Wear and tear on the joints that occur with aging (osteoarthritis).  Various other forms of arthritis.  A buildup of a crystal form of uric acid in the joint (gout).  Infections of the joint (septic arthritis) or of the bone (osteomyelitis). Your health care provider may recommend medicine to help with the pain. If your joint pain continues, additional tests may be needed to diagnose your condition. HOME CARE INSTRUCTIONS Watch your condition for any changes. Follow these instructions as directed to lessen the pain that you are feeling.  Take medicines only as directed by  your health care provider.  Rest the affected area for as long as your health care provider says that you should. If directed to do so, raise the painful joint above the level of your heart while you are sitting or lying down.  Do not do things that cause or worsen pain.  If directed, apply ice to the painful area:  Put ice in a plastic bag.  Place a towel between your skin and the bag.  Leave the ice on for 20 minutes, 2-3 times per day.  Wear an elastic bandage, splint, or sling as directed by your health care provider. Loosen the elastic bandage or splint if your fingers or toes become numb and tingle, or if they turn cold and blue.  Begin exercising or stretching the affected area as directed by your health care provider. Ask your health care provider what types of exercise are safe for you.  Keep all follow-up visits as directed by your health care provider. This is important. SEEK MEDICAL CARE IF:  Your pain increases, and medicine does not help.  Your joint pain does not improve within 3 days.  You have increased bruising or swelling.  You have a fever.  You lose 10 lb (4.5 kg) or more without trying. SEEK IMMEDIATE MEDICAL CARE IF:  You are not able to move the joint.  Your fingers or toes become numb or they turn cold and blue.   This information is not  intended to replace advice given to you by your health care provider. Make sure you discuss any questions you have with your health care provider.   Document Released: 01/17/2005 Document Revised: 02/07/2014 Document Reviewed: 10/29/2013 Elsevier Interactive Patient Education 2016 [employer].   IF you received an x-Mendez today, you will receive an invoice from [facility]. Please contact [facility] at [phone] with questions or concerns regarding your invoice.   IF you received labwork today, you will receive an invoice from [employer]. Please contact [facility] at [phone] with questions or concerns regarding your invoice.   Our billing staff will not be able to assist you with questions regarding bills from these companies.  You will be contacted with the lab results as soon as they are available. The fastest way to get your results is to activate your My Chart account. Instructions are located on the last page of this paperwork. If you have not heard from [place] regarding the results in 2 weeks, please contact this office.        I personally performed the services described in this documentation, which was scribed in my presence. The recorded information has been reviewed and considered, and addended by me as needed.   Signed,   Regina Ray, MD [facility].  12/13/15 10:19 AM

## 2015-12-12 NOTE — Patient Instructions (Addendum)
I will check a Lyme test as discussed, but other testing may be needed if this is elevated to clarify if significant or not. I did place referral to other rheumatologist as requested, but would try meloxicam once per day for now to help with aches and pains as that also can help other areas of inflammation. See numbers below for therapists that may help with some of your symptoms or discussing your other stressors.   Return to the clinic or go to the nearest emergency room if any of your symptoms worsen or new symptoms occur.   Vivia Budge: [id number] Arvil [id number] [phone]  Joint Pain Joint pain, which is also called arthralgia, can be caused by many things. Joint pain often goes away when you follow your health care provider's instructions for relieving pain at home. However, joint pain can also be caused by conditions that require further treatment. Common causes of joint pain include:  Bruising in the area of the joint.  Overuse of the joint.  Wear and tear on the joints that occur with aging (osteoarthritis).  Various other forms of arthritis.  A buildup of a crystal form of uric acid in the joint (gout).  Infections of the joint (septic arthritis) or of the bone (osteomyelitis). Your health care provider may recommend medicine to help with the pain. If your joint pain continues, additional tests may be needed to diagnose your condition. HOME CARE INSTRUCTIONS Watch your condition for any changes. Follow these instructions as directed to lessen the pain that you are feeling.  Take medicines only as directed by your health care provider.  Rest the affected area for as long as your health care provider says that you should. If directed to do so, raise the painful joint above the level of your heart while you are sitting or lying down.  Do not do things that cause or worsen pain.  If directed, apply ice to the painful area:  Put ice in a plastic bag.  Place a towel between  your skin and the bag.  Leave the ice on for 20 minutes, 2-3 times per day.  Wear an elastic bandage, splint, or sling as directed by your health care provider. Loosen the elastic bandage or splint if your fingers or toes become numb and tingle, or if they turn cold and blue.  Begin exercising or stretching the affected area as directed by your health care provider. Ask your health care provider what types of exercise are safe for you.  Keep all follow-up visits as directed by your health care provider. This is important. SEEK MEDICAL CARE IF:  Your pain increases, and medicine does not help.  Your joint pain does not improve within 3 days.  You have increased bruising or swelling.  You have a fever.  You lose 10 lb (4.5 kg) or more without trying. SEEK IMMEDIATE MEDICAL CARE IF:  You are not able to move the joint.  Your fingers or toes become numb or they turn cold and blue.   This information is not intended to replace advice given to you by your health care provider. Make sure you discuss any questions you have with your health care provider.   Document Released: 01/17/2005 Document Revised: 02/07/2014 Document Reviewed: 10/29/2013 Elsevier Interactive Patient Education 2016 [employer].   IF you received an x-ray today, you will receive an invoice from [facility]. Please contact [facility] at [phone] with questions or concerns regarding your invoice.   IF you received labwork  today, you will receive an invoice from [employer]. Please contact [facility] at [phone] with questions or concerns regarding your invoice.   Our billing staff will not be able to assist you with questions regarding bills from these companies.  You will be contacted with the lab results as soon as they are available. The fastest way to get your results is to activate your My Chart account. Instructions are located on the last page of this  paperwork. If you have not heard from [place] regarding the results in 2 weeks, please contact this office.

## 2015-12-14 ENCOUNTER — Other Ambulatory Visit: Payer: Self-pay | Admitting: Rheumatology

## 2015-12-14 ENCOUNTER — Ambulatory Visit: Payer: Self-pay | Admitting: Family Medicine

## 2015-12-14 DIAGNOSIS — M79641 Pain in right hand: Secondary | ICD-10-CM

## 2015-12-14 LAB — LYME AB/WESTERN BLOT REFLEX: B burgdorferi Ab IgG+IgM: <0.9 Index (ref <0.90)

## 2015-12-15 ENCOUNTER — Telehealth: Payer: Self-pay | Admitting: *Deleted

## 2015-12-15 ENCOUNTER — Other Ambulatory Visit: Payer: Self-pay

## 2015-12-15 NOTE — Telephone Encounter (Signed)
Usually not a common side effect for HRT. If anything patients frequently who have joint pain feel relief when they start on hormone replacement.

## 2015-12-15 NOTE — Telephone Encounter (Signed)
Pt currently takes climara patch and Prometrium 100 mg, pt has been having joint pain all over for 3 months, has seen PCP, and rheumatologist and no clear answer of why she having this join pain. Pt asked if you thought it could be hormone related? She is seeing another rheumatologist for a second opinion. Please advise

## 2015-12-15 NOTE — Telephone Encounter (Signed)
Unable to leave message, phone rings

## 2015-12-15 NOTE — Telephone Encounter (Signed)
Patient called back and left different phone number. I called her back and informed her.

## 2015-12-17 ENCOUNTER — Ambulatory Visit
Admission: RE | Admit: 2015-12-17 | Discharge: 2015-12-17 | Disposition: A | Discharge status: Home / Self Care | Payer: BC Managed Care – PPO | Source: Ambulatory Visit | Attending: Gynecology | Admitting: Gynecology

## 2015-12-17 ENCOUNTER — Other Ambulatory Visit: Payer: Self-pay | Admitting: Gynecology

## 2015-12-17 ENCOUNTER — Other Ambulatory Visit: Payer: Self-pay

## 2015-12-17 DIAGNOSIS — N6489 Other specified disorders of breast: Secondary | ICD-10-CM

## 2015-12-17 DIAGNOSIS — Z9889 Other specified postprocedural states: Secondary | ICD-10-CM

## 2015-12-18 ENCOUNTER — Ambulatory Visit (INDEPENDENT_AMBULATORY_CARE_PROVIDER_SITE_OTHER): Payer: BC Managed Care – PPO | Admitting: Gynecology

## 2015-12-18 ENCOUNTER — Encounter: Payer: Self-pay | Admitting: Gynecology

## 2015-12-18 VITALS — BP 118/76

## 2015-12-18 DIAGNOSIS — R928 Other abnormal and inconclusive findings on diagnostic imaging of breast: Secondary | ICD-10-CM | POA: Diagnosis not present

## 2015-12-18 NOTE — Patient Instructions (Signed)
    Follow up for breast biopsy as scheduled

## 2015-12-18 NOTE — Progress Notes (Signed)
    Regina Mendez 03/02/1960 [id number]        55 y.o.  [id number] Presents to discuss her most recent mammogram/ultrasound which showed an area of shadowing in the left breast that was not cleared on additional views. The radiologist had recommended and scheduled a biopsy of this area. Patient wanted my opinion and also to discuss her HRT.  Past medical history,surgical history, problem list, medications, allergies, family history and social history were all reviewed and documented in the EPIC chart.  Directed ROS with pertinent positives and negatives documented in the history of present illness/assessment and plan.  Exam: Regina Mendez assistant Vitals:   12/18/15 1007  BP: 118/76   General appearance:  Normal Both breast examined lying and sitting without definitive masses, retractions, discharge, adenopathy. Both breasts are extremely dense on exam is a always have been  Assessment/Plan:  55 y.o. [id number] with dense breasts on HRT with recent mammogram showing increased density left breast that does not clear on additional views. She is already scheduled for biopsy.I reviewed with the patient's a differential to include normal tissue, fibrocystic changes, cancer. Benefits of early detection if indeed it is cancer with appropriate treatment reviewed. I again reviewed the whole HRT issue as I have in the past to include possible increased risk of breast cancer associated with HRT. 1 in 8 without HRT developed breast cancer over their lifetimes.  if it is breast cancer patient understands overwhelming likelihood was HRT did not cause it but certainly cannot prove that as studies did show an increased risk associated with it. At this point the patient noted she tried weaning She will continue on her HRT for now area if indeed the biopsy does return cancer that she knows she has to stop her HRT and we will deal with her symptoms afterwards. Patient's questions were answered and she plans to follow up  for the biopsy as scheduled.  Greater than 50% of my time was spent in direct face to face counseling and coordination of care with the patient.     Regina Auerbach MD, 10:34 AM 12/18/2015

## 2016-01-08 ENCOUNTER — Ambulatory Visit
Admission: RE | Admit: 2016-01-08 | Discharge: 2016-01-08 | Disposition: A | Discharge status: Home / Self Care | Payer: BC Managed Care – PPO | Source: Ambulatory Visit | Attending: Gynecology | Admitting: Gynecology

## 2016-01-08 ENCOUNTER — Other Ambulatory Visit: Payer: Self-pay | Admitting: Gynecology

## 2016-01-08 DIAGNOSIS — N6489 Other specified disorders of breast: Secondary | ICD-10-CM

## 2016-01-12 ENCOUNTER — Telehealth: Payer: Self-pay | Admitting: *Deleted

## 2016-01-12 NOTE — Telephone Encounter (Signed)
Pt called to update you with results had breast Bx done on 01/08/16 has been [facility] to [facility] [place] [facility] 01/19/16, patient would like to know if she should continue taking HRT based on results? Please advise

## 2016-01-12 NOTE — Telephone Encounter (Signed)
I think she can stay on the HRT for now and she can asked Dr. Georgette Dover his opinion. I am unaware of [place] with HRT

## 2016-01-12 NOTE — Telephone Encounter (Signed)
Left a detailed on home number with the below per DPR access.

## 2016-01-13 ENCOUNTER — Telehealth: Payer: Self-pay | Admitting: Family Medicine

## 2016-01-15 ENCOUNTER — Encounter: Payer: Self-pay | Admitting: Family Medicine

## 2016-01-15 ENCOUNTER — Ambulatory Visit (INDEPENDENT_AMBULATORY_CARE_PROVIDER_SITE_OTHER): Payer: BC Managed Care – PPO | Admitting: Family Medicine

## 2016-01-15 VITALS — BP 130/70 | HR 81 | Temp 98.2°F | Wt 127.8 lb

## 2016-01-15 DIAGNOSIS — J029 Acute pharyngitis, unspecified: Secondary | ICD-10-CM

## 2016-01-15 NOTE — Progress Notes (Signed)
Subjective:     Patient ID: Regina Mendez, female   DOB: [birth year], 55 y.o.   MRN: [id number]  HPI Patient seen with sore throat. She just returned from [place] on December 6. She started developing symptoms when she was there. She had some laryngitis and sore throat symptoms but no fever. She has been doing saltwater gargles. She is actually significant improved today. Minimal nasal congestion. Denies any cough  Past Medical History:  Diagnosis Date  . Acne   . Breast cyst   . Migraine headache    Past Surgical History:  Procedure Laterality Date  . APPENDECTOMY    . BREAST SURGERY     right breast bx  . COLPOSCOPY    . GYNECOLOGIC CRYOSURGERY  age 58  . OVARIAN CYST SURGERY    . TONSILLECTOMY      reports that she has never smoked. She has never used smokeless tobacco. She reports that she drinks about 0.6 oz of alcohol per week . She reports that she does not use drugs. family history includes Breast cancer in her cousin; Heart disease in her father; Hypertension in her father; Thyroid cancer in her mother. Allergies  Allergen Reactions  . Codeine Other (See Comments)    Drowsy  . Sulfonamide Derivatives Dermatitis     Review of Systems  Constitutional: Negative for chills and fever.  HENT: Positive for sore throat. Negative for trouble swallowing.   Respiratory: Negative for cough.   Neurological: Negative for headaches.       Objective:   Physical Exam  Constitutional: She appears well-developed and well-nourished. No distress.  HENT:  Mouth/Throat: Oropharynx is clear and moist.  Neck: Neck supple.  Cardiovascular: Normal rate and regular rhythm.   Pulmonary/Chest: Effort normal and breath sounds normal. No respiratory distress. She has no wheezes. She has no rales.  Lymphadenopathy:    She has no cervical adenopathy.       Assessment:     Sore throat. Symptoms improving. Suspect recent viral process    Plan:     -Reassurance. No indication for  antibiotics at this time. -Follow-up for any fever or worsening symptoms  Eulas Post MD [facility]

## 2016-01-20 ENCOUNTER — Telehealth: Payer: Self-pay

## 2016-01-20 ENCOUNTER — Ambulatory Visit: Payer: Self-pay | Admitting: Surgery

## 2016-01-20 DIAGNOSIS — R928 Other abnormal and inconclusive findings on diagnostic imaging of breast: Secondary | ICD-10-CM

## 2016-01-20 NOTE — H&P (Signed)
History of Present Illness Regina Mendez. Regina Manera MD; 01/20/2016 9:35 AM) The patient is a 55 year old female who presents with a complaint of Breast problems. Referred by Dr. Stevie Kern and Dr. Donalynn Furlong for abnormal right mammogram  This is a 55 year old female who presents after a recent abnormal screening mammogram. She had an area of distortion in the lower outer quadrant of her right breast. She has had a stable left-sided breast mass that has been followed for the last 2 years. She underwent tomo mammogram as well as ultrasound on 12/17/15. She had an area of focal shadowing in the right breast at 6:30, 2 cm from the nipple. There is an area of distortion laterally in the right breast. This does not seem to correlate with the ultrasound findings. She underwent ultrasound biopsy of the area at 6:30, as well as coil-shaped clip placement. Subsequent clip mammogram showed that this did not correlate with the findings seen on mammogram in the lower outer quadrant. She subsequently underwent stereotactic biopsy with the placement of an X shaped clip. 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 This is felt to be concordant with the ultrasound findings in the lesion seen at 6:30. However this is not felt to be concordant with the lower outer quadrant mass containing the X shaped clip. She is now referred for surgical evaluation for lumpectomy of the more lateral area. There are no worrisome findings in the left breast.  Menarche age 52 First pregnancy age 32 Breast-feed yes Hormone supplements for at least 10 years. Previous right breast biopsy in 2001 that was reportedly benign Family history-she has no first-degree relatives with breast cancer. However she has several distant cousins on her mother's side that have had breast cancer.  CLINICAL DATA: Two year follow-up of a left breast mass. Known fibrocystic  changes.  EXAM: 2D DIGITAL DIAGNOSTIC BILATERAL MAMMOGRAM WITH CAD AND ADJUNCT TOMO  ULTRASOUND BILATERAL BREAST  COMPARISON: Previous exam(s).  ACR Breast Density Category c: The breast tissue is heterogeneously dense, which may obscure small masses.  FINDINGS: Multiple waxing and waning masses are identified in both breasts, consistent with known fibrocystic changes. There is distortion in the lateral left breast only seen on the CC view. Based on 3D images, it should be located inferior to the level of the nipple. The distortion improves but does not resolve on the spot tomographic images.  Mammographic images were processed with CAD.  On physical exam, no suspicious lumps are identified.  Targeted ultrasound is performed, showing a hypoechoic mass in the left breast at 10 o'clock, 3 cm from the nipple measuring 10 x 7 x 9 mm today versus 10 x 8 x 10 mm previously. There is a focal region of dense shadowing in the right breast at 6:30, 2 cm from the nipple measuring 6 x 10 x 6 mm. This is not located as laterally as expected for the distortion seen mammographically. No axillary adenopathy.  IMPRESSION: Focal shadowing in the right breast at 6:30, 2 cm from the nipple. This does not convincingly correlate with the mammographically seen distortion. Distortion in the lateral right breast. The left breast mass has been stable for 2 years and is benign. No adenopathy.  RECOMMENDATION: Ultrasound-guided biopsy of the focal shadowing in the right breast at 6:30, 2 cm from the nipple. Recommend clip placement with follow-up mammogram. If the clip does not correlate with the distortion recommend stereotactic biopsy of the distortion.  I have discussed the findings and recommendations with the patient. Results were also provided in writing at the conclusion of the visit. If applicable, a reminder letter will be sent to the patient regarding the next appointment.  BI-RADS  CATEGORY 4: Suspicious.   Electronically Signed By: Dorise Bullion III M.D On: 12/17/2015 16:44   CLINICAL DATA: 55 year old with architectural distortion in the outer right breast on recent screening mammography, diagnostic workup demonstrated a 1.0 cm shadowing focus at the 6:30 o'clock position approximately 2 cm from the nipple which may or may not correlate with the distortion on mammography. Biopsy of the shadowing focus is performed. The patient has a personal history of a remote benign excisional biopsy from the lower right breast.  Patient states that she has sensitivity to lidocaine. Therefore, Nesacaine, a local anesthetic that is an ester rather than an amide, is used today.  EXAM: ULTRASOUND GUIDED RIGHT BREAST CORE NEEDLE BIOPSY  COMPARISON: Previous exam(s).  FINDINGS: I met with the patient and we discussed the procedure of ultrasound-guided biopsy, including benefits and alternatives. We discussed the high likelihood of a successful procedure. We discussed the risks of the procedure, including infection, bleeding, tissue injury, clip migration, and inadequate sampling. Informed written consent was given. The usual time-out protocol was performed immediately prior to the procedure.  Using sterile technique with chlorhexidine as skin antisepsis, 2% Nesacaine as local anesthetic, under direct ultrasound visualization, a 12 gauge Bard Marquee core needle device was used to perform biopsy of the shadowing focus in the lower outer right breast using a lateral approach. At the conclusion of the procedure a coil shaped tissue marker clip was deployed into the biopsy cavity. Follow up 2 view mammogram was performed and dictated separately.  IMPRESSION: Ultrasound guided biopsy of an indeterminate shadowing focus in the lower outer right breast. No apparent complications.  Please note that Nesacaine was used instead of Lidocaine for local anesthesia due to  her prior Lidocaine sensitivity. She had no reaction to the Nesacaine.  Electronically Signed: By: Evangeline Dakin M.D. On: 01/08/2016 08:52  CLINICAL DATA: Confirmation of clip placement after ultrasound-guided core needle biopsy of an indeterminate shadowing focus in the lower outer right breast at the 6:30 o'clock position approximately 2 cm from the nipple. The patient had screening detected architectural distortion in the outer right breast on screening tomosynthesis mammography and the indeterminate shadowing focus was identified on diagnostic ultrasound.  EXAM: DIAGNOSTIC RIGHT MAMMOGRAM POST ULTRASOUND BIOPSY  COMPARISON: Previous exam(s).  FINDINGS: Mammographic images were obtained following ultrasound guided biopsy of an indeterminate 1.0 cm shadowing focus in the lower outer right breast at the 6:30 o'clock position approximately 2 cm from the nipple. The coil shaped tissue marker clip is appropriately positioned at the site of the biopsy in the lower outer breast at anterior depth. The biopsied shadowing focus does not correlate with the screening detected architectural distortion which is lateral and posterior to the coil shaped clip.  Expected post biopsy changes are present without evidence of hematoma.  IMPRESSION: 1. Appropriate positioning of the coil shaped tissue marker clip at the site of the biopsy in the lower outer right breast at anterior depth. 2. The biopsied indeterminate shadowing focus does not correspond to the tomosynthesis screening detected architectural distortion. Therefore, the patient will have tomosynthesis stereotactic guided biopsy of the architectural distortion subsequently and this will be reported separately.  Final Assessment: Post Procedure Mammograms for Marker Placement  Electronically Signed: By: Evangeline Dakin M.D. On: 01/08/2016 09:13  CLINICAL DATA: 55 year old with architectural distortion in the outer  right breast on recent screening tomosynthesis mammography. Diagnostic workup demonstrated a 1.0 cm shadowing focus at the 6:30 o'clock position approximately 2 cm from the nipple which was biopsied earlier today and does not correlate with the distortion on mammography. Therefore, the architectural distortion his biopsied under tomosynthesis stereotactic guidance.  Patient states that she has sensitivity to lidocaine. Therefore, Nesacaine, a local anesthetic that is an ester rather than an amide, is used today.  EXAM: RIGHT BREAST STEREOTACTIC CORE NEEDLE BIOPSY  COMPARISON: Previous exams.  FINDINGS: The patient and I discussed the procedure of stereotactic-guided biopsy including benefits and alternatives. We discussed the high likelihood of a successful procedure. We discussed the risks of the procedure including infection, bleeding, tissue injury, clip migration, and inadequate sampling. Informed written consent was given. The usual time out protocol was performed immediately prior to the procedure.  Using sterile technique with chlorhexidine as skin antisepsis, 2% Nesacaine as local anesthetic, under stereotactic guidance, a 9 gauge vacuum assisted device was used to perform core needle biopsy of the architectural distortion in the outer right breast using a superior approach. Specimen radiograph was not performed as there are no calcifications in the lesion.  At the conclusion of the procedure, an X shaped tissue marker clip was deployed into the biopsy cavity. Follow-up 2-view mammogram was performed and dictated separately.  IMPRESSION: Stereotactic-guided biopsy of architectural distortion in the outer right breast. No apparent complications.  The patient had no reaction to the 2% Nesacaine local anesthetic.  Electronically Signed: By: Evangeline Dakin M.D. On: 01/08/2016 10:12   ADDENDUM REPORT: 01/11/2016 11:16  ADDENDUM: Pathology revealed fibrocystic  changes and pseudoangiomatous stromal hyperplasia in the right breast at 6:30. This was found to be concordant by Dr. Evangeline Dakin. The outer right breast showed fibrocystic changes with usual ductal hyperplasia and pseudoangiomatous stromal hyperplasia. This was found to be discordant by Dr. Peggye Fothergill.  Pathology results were discussed with the patient by telephone. The patient reported doing well after the biopsies . Post biopsy instructions and care were reviewed and questions were answered. The patient was encouraged to call [facility] for any additional concerns. Surgical consultation has been arranged with Dr. Donnie Mesa at [facility] on January 19, 2016.  Pathology results reported by Susa Raring RN, BSN on 01/11/2016.   Electronically Signed By: Evangeline Dakin M.D. On: 01/11/2016 11:16   Past Surgical History Patsey Berthold, [place]; 01/19/2016 4:07 PM) Appendectomy Breast Biopsy Right. multiple Cesarean Section - Multiple Colon Polyp Removal - Colonoscopy Tonsillectomy  Diagnostic Studies History Patsey Berthold, [place]; 01/19/2016 4:07 PM) Colonoscopy 5-10 years ago Mammogram within last year Pap Smear 1-5 years ago  Allergies Patsey Berthold, [place]; 01/19/2016 4:08 PM) Codeine Phosphate *ANALGESICS - OPIOID* drowsy Sulfonamide Derivatives  Medication History Patsey Berthold, CMA; 01/19/2016 4:10 PM) Proventil HFA (108 (90 Base)MCG/ACT Aerosol Soln, Inhalation as needed) Active. Aspirin (81MG Tablet, Oral) Active. Qvar (40MCG/ACT Aerosol Soln, Inhalation as needed) Active. Biotin (10MG Capsule, Oral) Active. Clindamycin Phosphate (1% Solution, External) Active. Estradiol (0.075MG/24HR Patch Weekly, Transdermal) Active. Melatonin (1MG Capsule, Oral as needed) Active. Hair Skin and Nails Formula (Oral) Active. Progesterone (100MG Capsule, Oral) Active. Retin-A (0.05% Cream,  External) Active. Medications Reconciled  Social History Patsey Berthold, [place]; 01/19/2016 4:07 PM) Alcohol use Occasional alcohol use. Caffeine use Coffee. No drug use Tobacco use Never smoker.  Family History Patsey Berthold, [place]; 01/19/2016 4:07 PM) Arthritis Mother. Breast Cancer Family  Members In General. Cancer Family Members In General. Colon Cancer Family Members In General. Colon Polyps Family Members In General. Depression Family Members In General. Diabetes Mellitus Family Members In General. Heart Disease Family Members In General. Hypertension Family Members In General. Prostate Cancer Father. Thyroid problems Mother.  Pregnancy / Birth History Patsey Berthold, [place]; 01/19/2016 4:07 PM) Age at menarche 61 years. Age of menopause <46 Gravida 2 Irregular periods Length (months) of breastfeeding 12-24 Maternal age 97-40 Para 2  Other Problems Patsey Berthold, [place]; 01/19/2016 4:07 PM) Arthritis Lump In Breast     Review of Systems Patsey Berthold CMA; 01/19/2016 4:07 PM) General Not Present- Appetite Loss, Chills, Fatigue, Fever, Night Sweats, Weight Gain and Weight Loss. Skin Not Present- Change in Wart/Mole, Dryness, Hives, Jaundice, New Lesions, Non-Healing Wounds, Rash and Ulcer. HEENT Present- Hoarseness, Ringing in the Ears and Seasonal Allergies. Not Present- Earache, Hearing Loss, Nose Bleed, Oral Ulcers, Sinus Pain, Sore Throat, Visual Disturbances, Wears glasses/contact lenses and Yellow Eyes. Breast Present- Breast Mass. Not Present- Breast Pain, Nipple Discharge and Skin Changes. Cardiovascular Not Present- Chest Pain, Difficulty Breathing Lying Down, Leg Cramps, Palpitations, Rapid Heart Rate, Shortness of Breath and Swelling of Extremities. Gastrointestinal Not Present- Abdominal Pain, Bloating, Bloody Stool, Change in Bowel Habits, Chronic diarrhea, Constipation, Difficulty Swallowing, Excessive gas, Gets full quickly at meals,  Hemorrhoids, Indigestion, Nausea, Rectal Pain and Vomiting. Female Genitourinary Not Present- Frequency, Nocturia, Painful Urination, Pelvic Pain and Urgency. Musculoskeletal Present- Joint Pain. Not Present- Back Pain, Joint Stiffness, Muscle Pain, Muscle Weakness and Swelling of Extremities. Neurological Not Present- Decreased Memory, Fainting, Headaches, Numbness, Seizures, Tingling, Tremor, Trouble walking and Weakness. Psychiatric Not Present- Anxiety, Bipolar, Change in Sleep Pattern, Depression, Fearful and Frequent crying. Endocrine Not Present- Cold Intolerance, Excessive Hunger, Hair Changes, Heat Intolerance, Hot flashes and New Diabetes. Hematology Not Present- Blood Thinners, Easy Bruising, Excessive bleeding, Gland problems, HIV and Persistent Infections.  Vitals Patsey Berthold CMA; 01/19/2016 4:11 PM) 01/19/2016 4:10 PM Weight: 127.8 lb Height: 64in Body Surface Area: 1.62 m Body Mass Index: 21.94 kg/m  Temp.: 98.74F  Pulse: 81 (Regular)  BP: 110/70 (Sitting, Left Arm, Standard)      Physical Exam Rodman Key K. Amalea Ottey MD; 01/20/2016 9:37 AM)  The physical exam findings are as follows: Note:WDWN in NAD Eyes: Pupils equal, round; sclera anicteric HENT: Oral mucosa moist; good dentition Neck: No masses palpated, no thyromegaly Breasts: Left breast shows no nipple retraction or nipple discharge; no axillary lymphadenopathy; widespread fibrocystic changes with a 1.5 cm area of firmness above the left nipple. This has been present for several years and has a benign appearance on mammogram. Right breast shows significant bruising below the nipple and in the lower outer quadrant at the areas of biopsy. No nipple retraction or discharge; no axillary lymphadenopathy; diffuse fibrocystic changes with no dominant masses Lungs: CTA bilaterally; normal respiratory effort CV: Regular rate and rhythm; no murmurs; extremities well-perfused with no edema Abd: +bowel sounds,  soft, non-tender, no palpable organomegaly; no palpable hernias Skin: Warm, dry; no sign of jaundice Psychiatric - alert and oriented x 4; calm mood and affect    Assessment & Plan Rodman Key K. Nyasiah Moffet MD; 01/20/2016 9:39 AM)  PSEUDOANGIOMATOUS STROMAL HYPERPLASIA OF BREAST (N62) Impression: Right lower outer quadrant/ right breast 6:30  ABNORMAL MAMMOGRAM OF RIGHT BREAST (R92.8)  Current Plans Schedule for Surgery - right radioactive seed localized lumpectomy. The surgical procedure has been discussed with the patient. Potential risks, benefits, alternative treatments, and expected outcomes have been explained.  All of the patient's questions at this time have been answered. The likelihood of reaching the patient's treatment goal is good. The patient understand the proposed surgical procedure and wishes to proceed. Note:The patient is quite concerned about the possibility of sampling error and the possibility that there is residual cancer despite the biopsy showing only benign findings. It took a considerable amount of time (45 minutes) to explain to the patient our recommendations. The area at 6:30 in the right breast shows only PAS H which correlates with the ultrasound findings. Our concern is a bit higher with the more lateral lesion. The area of distortion seen on mammogram also return of biopsy diagnosis of PAS H but this does not correlate completely with the mammographic findings. Therefore we recommend excision of this area.  We will plan to perform a right radioactive seed localized lumpectomy for diagnosis. We will only excise the more lateral lesion with the X-shaped biopsy clip. After extensive discussion the patient seems to understand the plan and is in agreement with this plan. Her husband has asked appropriate questions and he is in agreement with this plan as well.   Regina Mendez. Georgette Dover, MD, [facility]  General/ Trauma Surgery  01/20/2016 9:43  AM

## 2016-01-20 NOTE — Telephone Encounter (Signed)
#  1 Patient is trying to wean off HRT. Cutting her Climara 0.075 mg patch in half. She asked what to do about the Prometrium ?  #2 Patient saw Dr. Georgette Dover today regarding abd mammo/breast bx.  She wanted to ask you questions. Said she would be happy to come in and talk to you if needed.   -She wants to know if Dr. Georgette Dover a good surgeon or do you maybe know someone at [facility] she should see. She just wants to be sure she has best MD.  -She wants to know if she should get 2nd opinion.  -She wants to understand seriousness of this procedure and concerned about appearance of breast after surgery.

## 2016-01-21 NOTE — Telephone Encounter (Signed)
#  1 I think weaning from the HRT is not a bad idea. I would cut the patch in half for one to 2 weeks and then cut it again two quarters for another 1-2 weeks and then stop. I would continue the Prometrium until she is done with the estrogen and then stop it altogether.  #2 Dr. Georgette Dover has a good reputation. I'm not sure going somewhere else would afford you any benefits.  #3 This is a very common procedure that they do every day. But having said that if nothing but for her own peace of mind I always encourage patients to get a second opinion if they have a question. In [place] all of the surgeons belong to the same group.  I'm not sure of their policy about seeing somebody different with in the same group. I know all of the medical centers in the area do have breast clinics i.e. [facility] [place], [place] that she could make an appointment there.  #4 Lastly how it will look afterwards is really unpredictable because everybody heals differently. I have seen patients where I can't even tell they had a biopsy/lumpectomy and I have seen other patients he will have retractions or distortions from the surgery.

## 2016-01-21 NOTE — Telephone Encounter (Signed)
Patient informed. 

## 2016-01-26 ENCOUNTER — Emergency Department (HOSPITAL_COMMUNITY)
Admission: EM | Admit: 2016-01-26 | Discharge: 2016-01-26 | Disposition: A | Discharge status: Home / Self Care | Payer: BC Managed Care – PPO

## 2016-01-27 ENCOUNTER — Telehealth: Payer: Self-pay

## 2016-01-27 ENCOUNTER — Ambulatory Visit (INDEPENDENT_AMBULATORY_CARE_PROVIDER_SITE_OTHER): Payer: BC Managed Care – PPO | Admitting: Emergency Medicine

## 2016-01-27 ENCOUNTER — Ambulatory Visit (INDEPENDENT_AMBULATORY_CARE_PROVIDER_SITE_OTHER): Payer: BC Managed Care – PPO | Admitting: Psychology

## 2016-01-27 ENCOUNTER — Ambulatory Visit (INDEPENDENT_AMBULATORY_CARE_PROVIDER_SITE_OTHER): Payer: BC Managed Care – PPO

## 2016-01-27 VITALS — BP 118/76 | HR 83 | Temp 98.7°F | Resp 16 | Ht 65.0 in | Wt 124.0 lb

## 2016-01-27 DIAGNOSIS — S39012A Strain of muscle, fascia and tendon of lower back, initial encounter: Secondary | ICD-10-CM | POA: Diagnosis not present

## 2016-01-27 DIAGNOSIS — S93402A Sprain of unspecified ligament of left ankle, initial encounter: Secondary | ICD-10-CM | POA: Diagnosis not present

## 2016-01-27 DIAGNOSIS — S46819A Strain of other muscles, fascia and tendons at shoulder and upper arm level, unspecified arm, initial encounter: Secondary | ICD-10-CM

## 2016-01-27 DIAGNOSIS — F411 Generalized anxiety disorder: Secondary | ICD-10-CM

## 2016-01-27 NOTE — Progress Notes (Signed)
Regina Mendez 55 y.o.   Chief Complaint  Patient presents with  . Back Pain    pt. fell, x 1 day  . Ankle Pain    left  . Neck Pain    HISTORY OF PRESENT ILLNESS: This is a 55 y.o. female complaining of pain to left ankle, and upper back since falling yesterday while playing tennis.  Back Pain  This is a new problem. The current episode started yesterday. The problem occurs constantly. The problem is unchanged. The pain is present in the thoracic spine. The pain does not radiate. The pain is at a severity of 3/10. The pain is mild. The pain is the same all the time. The symptoms are aggravated by position. Stiffness is present all day. Pertinent negatives include no abdominal pain or paresthesias. Risk factors include recent trauma (was playing tennis yesterday). She has tried nothing for the symptoms.  Ankle Pain   The incident occurred 12 to 24 hours ago. Incident location: at tennis court. The injury mechanism was a twisting injury and a fall. The pain is present in the left ankle. The quality of the pain is described as aching. The pain is at a severity of 4/10. The pain is moderate. The pain has been constant since onset. Pertinent negatives include no inability to bear weight or loss of motion. The symptoms are aggravated by weight bearing. She has tried ice and NSAIDs for the symptoms. The treatment provided mild relief.     Prior to Admission medications   Medication Sig Start Date End Date Taking? Authorizing Provider  albuterol (PROVENTIL HFA;VENTOLIN HFA) 108 (90 BASE) MCG/ACT inhaler Inhale 2 puffs into the lungs every 6 (six) hours as needed.     Yes Historical Provider, MD  aspirin 81 MG tablet Take 81 mg by mouth daily.     Yes Historical Provider, MD  beclomethasone (QVAR) 40 MCG/ACT inhaler Inhale 2 puffs into the lungs 2 (two) times daily.     Yes Historical Provider, MD  BIOTIN PO Take by mouth.   Yes Historical Provider, MD  clindamycin (CLEOCIN-T) 1 % external  solution Apply topically 2 (two) times daily. 01/14/14  Yes Dorena Cookey, MD  estradiol (CLIMARA - DOSED IN MG/24 HR) 0.075 mg/24hr patch APPLY ONE PATCH ONCE WEEKLY. 04/07/15  Yes [name], MD  Melatonin 1 MG TABS Take by mouth.   Yes Historical Provider, MD  meloxicam (MOBIC) 7.5 MG tablet Take 1 tablet (7.5 mg total) by mouth daily. 12/12/15  Yes Wendie Agreste, MD  Misc Natural Products (DEEP SLEEP PO) Take by mouth.   Yes Historical Provider, MD  Multiple Vitamins-Minerals (HAIR/SKIN/NAILS PO) Take by mouth.   Yes Historical Provider, MD  progesterone (PROMETRIUM) 100 MG capsule TAKE 1 CAPSULE AT BEDTIME. 04/07/15  Yes Timothy P Fontaine, MD  tretinoin (RETIN-A) 0.05 % cream Apply topically at bedtime. 01/14/14  Yes Dorena Cookey, MD    Allergies  Allergen Reactions  . Codeine Other (See Comments)    Drowsy  . Sulfonamide Derivatives Dermatitis    Patient Active Problem List   Diagnosis Date Noted  . Routine general medical examination at a health care facility 01/14/2014  . Chronic insomnia 10/26/2012  . Joint pain 08/16/2011  . Cervical dysplasia   . Vertigo 09/27/2010  . 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 Skin rash 08/02/2010  . ALLERGIC RHINITIS 09/07/2009  . LIPOMA 06/26/2009  . SHOULDER PAIN, LEFT 12/17/2008  . RECTAL BLEEDING, HX OF 12/17/2008  .  MIGRAINE HEADACHE 07/05/2007  . LUMP OR MASS IN BREAST 07/05/2007  . MENOPAUSE, EARLY 07/05/2007    Past Medical History:  Diagnosis Date  . Acne   . Breast cyst   . Migraine headache     Past Surgical History:  Procedure Laterality Date  . APPENDECTOMY    . BREAST SURGERY     right breast bx  . COLPOSCOPY    . GYNECOLOGIC CRYOSURGERY  age 89  . OVARIAN CYST SURGERY    . TONSILLECTOMY      Social History   Social History  . Marital status: Married    Spouse name: N/A  . Number of children: N/A  . Years of education: N/A   Occupational History  . Not on file.   Social History Main  Topics  . Smoking status: Never Smoker  . Smokeless tobacco: Never Used  . Alcohol use 0.6 oz/week    1 Standard drinks or equivalent per week  . Drug use: No  . Sexual activity: Yes    Birth control/ protection: Post-menopausal     Comment: 1st intercourse 55 yo-More than 5 partners   Other Topics Concern  . Not on file   Social History Narrative  . No narrative on file    Family History  Problem Relation Age of Onset  . Thyroid cancer Mother   . Hypertension Father   . Heart disease Father   . Breast cancer Cousin     Mat. 1st cousins X 2     Age 77's     Review of Systems  Constitutional: Negative.   HENT: Negative.   Eyes: Negative.   Cardiovascular: Negative.   Gastrointestinal: Negative for abdominal pain, nausea and vomiting.  Genitourinary: Negative.   Musculoskeletal: Positive for back pain and neck pain (trapezius area).  Skin: Negative.   Neurological: Negative.  Negative for paresthesias.  Endo/Heme/Allergies: Negative.   Psychiatric/Behavioral: Negative.   All other systems reviewed and are negative.  Vitals:   01/27/16 1121  BP: 118/76  Pulse: 83  Resp: 16  Temp: 98.7 F (37.1 C)     Physical Exam  Constitutional: She is oriented to person, place, and time. She appears well-developed and well-nourished.  HENT:  Head: Normocephalic and atraumatic.  Eyes: Conjunctivae and EOM are normal. Pupils are equal, round, and reactive to light.  Neck: Normal range of motion. Neck supple.  Mild tenderness to trapezius muscle   Cardiovascular: Normal rate, regular rhythm, normal heart sounds and intact distal pulses.   Pulmonary/Chest: Effort normal and breath sounds normal.  Abdominal: Soft. There is no tenderness.  Musculoskeletal: Normal range of motion.  Left ankle: FROM, NVI, mild swelling with tenderness lateral ankle  Neurological: She is alert and oriented to person, place, and time.  Skin: Skin is warm and dry.  Vitals  reviewed.    ASSESSMENT & PLAN: Allyshia was seen today for back pain, ankle pain and neck pain.  Diagnoses and all orders for this visit:  Sprain of left ankle, unspecified ligament, initial encounter -     DG Ankle Complete Left; Future  Strain of trapezius muscle, unspecified laterality, initial encounter  Back strain, initial encounter    Patient Instructions       IF you received an x-ray today, you will receive an invoice from [facility]. Please contact [facility] at [phone] with questions or concerns regarding your invoice.   IF you received labwork today, you will receive an invoice from [employer] [facility]. Please contact [facility] at  [phone] with questions or concerns regarding your invoice.   Our billing staff will not be able to assist you with questions regarding bills from these companies.  You will be contacted with the lab results as soon as they are available. The fastest way to get your results is to activate your My Chart account. Instructions are located on the last page of this paperwork. If you have not heard from [place] regarding the results in 2 weeks, please contact this office.      Thoracic Strain Thoracic strain is an injury to the muscles or tendons that attach to the upper back. A strain can be mild or severe. A mild strain may take only 1-2 weeks to heal. A severe strain involves torn muscles or tendons, so it may take 6-8 weeks to heal. Follow these instructions at home:  Rest as needed. Limit your activity as told by your doctor.  If directed, put ice on the injured area:  Put ice in a plastic bag.  Place a towel between your skin and the bag.  Leave the ice on for 20 minutes, 2-3 times per day.  Take over-the-counter and prescription medicines only as told by your doctor.  Begin doing exercises as told by your doctor or physical therapist.  Warm up before being active.  Bend your knees before you lift heavy  objects.  Keep all follow-up visits as told by your doctor. This is important. Contact a doctor if:  Your pain is not helped by medicine.  Your pain, bruising, or swelling is getting worse.  You have a fever. Get help right away if:  You have shortness of breath.  You have chest pain.  You have weakness or loss of feeling (numbness) in your legs.  You cannot control when you pee (urinate). This information is not intended to replace advice given to you by your health care provider. Make sure you discuss any questions you have with your health care provider. Document Released: 07/06/2007 Document Revised: 09/19/2015 Document Reviewed: 03/13/2014 Elsevier Interactive Patient Education  2017 Elsevier Inc.  Ankle Sprain Introduction An ankle sprain is a stretch or tear in one of the tough tissues (ligaments) in your ankle. Follow these instructions at home:  Rest your ankle.  Take over-the-counter and prescription medicines only as told by your doctor.  For 2-3 days, keep your ankle higher than the level of your heart (elevated) as much as possible.  If directed, put ice on the area:  Put ice in a plastic bag.  Place a towel between your skin and the bag.  Leave the ice on for 20 minutes, 2-3 times a day.  If you were given a brace:  Wear it as told.  Take it off to shower or bathe.  Try not to move your ankle much, but wiggle your toes from time to time. This helps to prevent swelling.  If you were given an elastic bandage (dressing):  Take it off when you shower or bathe.  Try not to move your ankle much, but wiggle your toes from time to time. This helps to prevent swelling.  Adjust the bandage to make it more comfortable if it feels too tight.  Loosen the bandage if you lose feeling in your foot, your foot tingles, or your foot gets cold and blue.  If you have crutches, use them as told by your doctor. Continue to use them until you can walk without  feeling pain in your ankle. Contact a doctor if:  Your bruises or swelling are quickly getting worse.  Your pain does not get better after you take medicine. Get help right away if:  You cannot feel your toes or foot.  Your toes or your foot looks blue.  You have very bad pain that gets worse. This information is not intended to replace advice given to you by your health care provider. Make sure you discuss any questions you have with your health care provider. Document Released: 07/06/2007 Document Revised: 06/25/2015 Document Reviewed: 08/19/2014  2017 Elsevier    X-ray reviewed. No Fx. Ankle sprain instructions given. Advised on how to care for her back.  Agustina Caroli, MD [facility]

## 2016-01-27 NOTE — Patient Instructions (Addendum)
IF you received an x-ray today, you will receive an invoice from [facility]. Please contact [facility] at [phone] with questions or concerns regarding your invoice.   IF you received labwork today, you will receive an invoice from [place] [facility]. Please contact [facility] at [phone] with questions or concerns regarding your invoice.   Our billing staff will not be able to assist you with questions regarding bills from these companies.  You will be contacted with the lab results as soon as they are available. The fastest way to get your results is to activate your My Chart account. Instructions are located on the last page of this paperwork. If you have not heard from [place] regarding the results in 2 weeks, please contact this office.      Thoracic Strain Thoracic strain is an injury to the muscles or tendons that attach to the upper back. A strain can be mild or severe. A mild strain may take only 1-2 weeks to heal. A severe strain involves torn muscles or tendons, so it may take 6-8 weeks to heal. Follow these instructions at home:  Rest as needed. Limit your activity as told by your doctor.  If directed, put ice on the injured area:  Put ice in a plastic bag.  Place a towel between your skin and the bag.  Leave the ice on for 20 minutes, 2-3 times per day.  Take over-the-counter and prescription medicines only as told by your doctor.  Begin doing exercises as told by your doctor or physical therapist.  Warm up before being active.  Bend your knees before you lift heavy objects.  Keep all follow-up visits as told by your doctor. This is important. Contact a doctor if:  Your pain is not helped by medicine.  Your pain, bruising, or swelling is getting worse.  You have a fever. Get help right away if:  You have shortness of breath.  You have chest pain.  You have weakness or loss of feeling (numbness) in your legs.  You cannot control when you  pee (urinate). This information is not intended to replace advice given to you by your health care provider. Make sure you discuss any questions you have with your health care provider. Document Released: 07/06/2007 Document Revised: 09/19/2015 Document Reviewed: 03/13/2014 Elsevier Interactive Patient Education  2017 Elsevier Inc.  Ankle Sprain Introduction An ankle sprain is a stretch or tear in one of the tough tissues (ligaments) in your ankle. Follow these instructions at home:  Rest your ankle.  Take over-the-counter and prescription medicines only as told by your doctor.  For 2-3 days, keep your ankle higher than the level of your heart (elevated) as much as possible.  If directed, put ice on the area:  Put ice in a plastic bag.  Place a towel between your skin and the bag.  Leave the ice on for 20 minutes, 2-3 times a day.  If you were given a brace:  Wear it as told.  Take it off to shower or bathe.  Try not to move your ankle much, but wiggle your toes from time to time. This helps to prevent swelling.  If you were given an elastic bandage (dressing):  Take it off when you shower or bathe.  Try not to move your ankle much, but wiggle your toes from time to time. This helps to prevent swelling.  Adjust the bandage to make it more comfortable if it feels too tight.  Loosen the bandage  if you lose feeling in your foot, your foot tingles, or your foot gets cold and blue.  If you have crutches, use them as told by your doctor. Continue to use them until you can walk without feeling pain in your ankle. Contact a doctor if:  Your bruises or swelling are quickly getting worse.  Your pain does not get better after you take medicine. Get help right away if:  You cannot feel your toes or foot.  Your toes or your foot looks blue.  You have very bad pain that gets worse. This information is not intended to replace advice given to you by your health care provider.  Make sure you discuss any questions you have with your health care provider. Document Released: 07/06/2007 Document Revised: 06/25/2015 Document Reviewed: 08/19/2014  2017 Elsevier

## 2016-01-27 NOTE — Telephone Encounter (Signed)
Pt called [employer] last night. Pt went to [facility] ED and left without being seen. Pt is scheduled to see [facility] today.   Patient Name: Regina Mendez Gender: Female DOB: 1960/11/27 Age: 55 Y 9 M 11 D Return Phone Number: [phone] (Primary), [phone] (Secondary) Address: City/State/Zip: [place] Client [facility] - Client Client [facility] - Night Physician Christie Nottingham - MD Contact Type Call Who Is Calling Patient / Member / Family / Caregiver Call Type Triage / Clinical Relationship To Patient Self Return Phone Number [phone] (Secondary) Chief Complaint Foot or Ankle Injury Reason for Call Symptomatic / Request for [name] states that she injured her left ankle and is having pain

## 2016-01-28 NOTE — Telephone Encounter (Signed)
Error/slb

## 2016-02-03 ENCOUNTER — Telehealth: Payer: Self-pay

## 2016-02-03 NOTE — Telephone Encounter (Signed)
Pt. Will be stopping in for an ankle splint for left ankle. She was already seen but did not want splint until she checked with insurance, will pick up 02/03/16 or 02/04/16. Please fit and fill out paperwork.

## 2016-02-03 NOTE — Telephone Encounter (Signed)
Pt is needing to know when she can resume Yogi with her left ankle sprain and would like to get a copy of xray of her ankle   Best number [phone]

## 2016-02-03 NOTE — Telephone Encounter (Signed)
5pm: Spoke to Regina Mendez; she's doing better; advised to wait until she's close to 100% better before starting her regular exercise routine; she will be stopping by later to pick up ankle velcro stirrup splint she initially refused.

## 2016-02-03 NOTE — Telephone Encounter (Signed)
Dr. Kittie Plater, [place] wants to know when or if she can resume Yoga. I did not see a letter of restrictions in the chart  Please advise

## 2016-02-05 ENCOUNTER — Encounter: Payer: Self-pay | Admitting: Family Medicine

## 2016-02-05 ENCOUNTER — Ambulatory Visit (INDEPENDENT_AMBULATORY_CARE_PROVIDER_SITE_OTHER): Payer: BC Managed Care – PPO | Admitting: Family Medicine

## 2016-02-05 VITALS — BP 122/80 | HR 81 | Resp 12 | Ht 65.0 in | Wt 128.4 lb

## 2016-02-05 DIAGNOSIS — N63 Unspecified lump in unspecified breast: Secondary | ICD-10-CM

## 2016-02-05 DIAGNOSIS — Z9229 Personal history of other drug therapy: Secondary | ICD-10-CM

## 2016-02-05 DIAGNOSIS — S93402D Sprain of unspecified ligament of left ankle, subsequent encounter: Secondary | ICD-10-CM | POA: Diagnosis not present

## 2016-02-05 DIAGNOSIS — Z79890 Hormone replacement therapy (postmenopausal): Secondary | ICD-10-CM

## 2016-02-05 NOTE — Patient Instructions (Signed)
A few things to remember from today's visit:   Sprain of left ankle, unspecified ligament, subsequent encounter  Lump or mass in breast  Left ankle immobilization for no more than a week then PT (ABC exercises).  Caution with hormonal therapy.   Please be sure medication list is accurate. If a new problem present, please set up appointment sooner than planned today.

## 2016-02-05 NOTE — Progress Notes (Signed)
Pre visit review using our clinic review tool, if applicable. No additional management support is needed unless otherwise documented below in the visit note. 

## 2016-02-05 NOTE — Progress Notes (Signed)
HPI:  ACUTE VISIT:  Chief Complaint  Patient presents with  . FMLA    concerns    [name] is a 56 y.o. female, who is here today because she has a few concerns and could not see her PCP. She is patient of Dr Sherren Mocha, she tells me that she has an appt coming for her CPE.  She starts with mentioning that she has had a lot of issues in the past few months but nobody has figured out the cause, states that nobody cares, "nobody wants to get wet."  -She was in acute care recently (about a week ago)because left ankle injury, while she was exercising at the gyn she fell. Dx with ankle sprain, splint was recommended. She is reporting improvement in pain and edema but not sure for how long she needs to wear splint.  -She is requesting to have FMLA fill out. According to pt, she recently had abnormal mammogram and right breast Bx done. She tell me that result was not definitive, lumpectomy was recommended but she is not satisfied with surgeon she recently saw.She has an appt with another surgeon at [facility] for a second opinion. She would like to have FMLA in case she needs breast surgery or further procedures. Appt is in a week.   She is on hormonal therapy, according to pt, she was not instructed to discontinued. She follows with gyn periodically. She has not noted nipple discharge or masses.  She lives with her husband and 2 daughter (25 yo twins), both with Hx of anorexia nervosa.  She has had some mild health issues in the past few months: Arthralgias with elevated RF, following with rheumatologists. -Left eye chalazion, has appt with ophthalmologists to have it removed.    Review of Systems  Constitutional: Positive for fatigue. Negative for activity change, appetite change, fever and unexpected weight change.  HENT: Negative for mouth sores, nosebleeds and trouble swallowing.   Eyes: Negative for pain, redness and visual disturbance.  Respiratory: Negative for  cough, shortness of breath and wheezing.   Cardiovascular: Negative for chest pain, palpitations and leg swelling.  Gastrointestinal: Negative for abdominal pain, nausea and vomiting.       Negative for changes in bowel habits.  Genitourinary: Negative for decreased urine volume and hematuria.  Musculoskeletal: Positive for arthralgias. Negative for joint swelling.  Skin: Negative for rash.  Neurological: Negative for syncope, weakness, numbness and headaches.  Psychiatric/Behavioral: Negative for confusion. The patient is nervous/anxious.       Current Outpatient Prescriptions on File Prior to Visit  Medication Sig Dispense Refill  . albuterol (PROVENTIL HFA;VENTOLIN HFA) 108 (90 BASE) MCG/ACT inhaler Inhale 2 puffs into the lungs every 6 (six) hours as needed.      Marland Kitchen aspirin 81 MG tablet Take 81 mg by mouth daily.      . beclomethasone (QVAR) 40 MCG/ACT inhaler Inhale 2 puffs into the lungs 2 (two) times daily.      Marland Kitchen BIOTIN PO Take by mouth.    . clindamycin (CLEOCIN-T) 1 % external solution Apply topically 2 (two) times daily. 30 mL 5  . estradiol (CLIMARA - DOSED IN MG/24 HR) 0.075 mg/24hr patch APPLY ONE PATCH ONCE WEEKLY. 12 patch 4  . Melatonin 1 MG TABS Take by mouth.    . meloxicam (MOBIC) 7.5 MG tablet Take 1 tablet (7.5 mg total) by mouth daily. 30 tablet 0  . Misc Natural Products (DEEP SLEEP PO) Take by mouth.    Marland Kitchen  Multiple Vitamins-Minerals (HAIR/SKIN/NAILS PO) Take by mouth.    . progesterone (PROMETRIUM) 100 MG capsule TAKE 1 CAPSULE AT BEDTIME. 90 capsule 4  . tretinoin (RETIN-A) 0.05 % cream Apply topically at bedtime. 45 g 5   No current facility-administered medications on file prior to visit.      Past Medical History:  Diagnosis Date  . Acne   . Breast cyst   . Migraine headache    Allergies  Allergen Reactions  . Codeine Other (See Comments)    Drowsy  . Sulfonamide Derivatives Dermatitis    Social History   Social History  . Marital status:  Married    Spouse name: N/A  . Number of children: N/A  . Years of education: N/A   Social History Main Topics  . Smoking status: Never Smoker  . Smokeless tobacco: Never Used  . Alcohol use 0.6 oz/week    1 Standard drinks or equivalent per week  . Drug use: No  . Sexual activity: Yes    Birth control/ protection: Post-menopausal     Comment: 1st intercourse 56 yo-More than 5 partners   Other Topics Concern  . None   Social History Narrative  . None    Vitals:   02/05/16 1343  BP: 122/80  Pulse: 81  Resp: 12   Body mass index is 21.36 kg/m.     Physical Exam  Nursing note and vitals reviewed. Constitutional: She is oriented to person, place, and time. She appears well-developed and well-nourished. No distress.  HENT:  Head: Atraumatic.  Mouth/Throat: Oropharynx is clear and moist and mucous membranes are normal.  Eyes: Conjunctivae and EOM are normal. Pupils are equal, round, and reactive to light.  Left eye" lower eye lid,nasal angle, 2 mm papular,erythematous,non tender lesion.  Neck: No tracheal deviation present. No thyroid mass and no thyromegaly present.  Cardiovascular: Normal rate and regular rhythm.   No murmur heard. Pulses:      Dorsalis pedis pulses are 2+ on the left side.  Respiratory: Effort normal and breath sounds normal. No respiratory distress.  GI: Soft. She exhibits no mass. There is no hepatomegaly. There is no tenderness.  Musculoskeletal: She exhibits no edema.  L ankle splint  Neurological: She is alert and oriented to person, place, and time. She has normal strength. Coordination normal.  Skin: Skin is warm. No erythema.  Psychiatric: Her mood appears anxious. Her affect is labile. Cognition and memory are normal.  Well groomed, good eye contact.      ASSESSMENT AND PLAN:     Regina Mendez was seen today for fmla.  Diagnoses and all orders for this visit:   Sprain of left ankle, unspecified ligament, subsequent encounter  I  do not have records of acute visit at this time. Complete 1 week immobilization and start with PT exercises (ABC). F/U as needed.  Lump or mass in breast  Keep appt with surgeon. I offered filling FMLA for a week (until dare of OV) but she is not interested in a limited time, she would like unlimited for the next few months. Explained that if further treatments (including surgery) are needed, surgeon usually fills FMLA accordingly.   History of ongoing treatment with hormonal therapy  Recommend stopping hormonal therapy for now. She prefers to continue until she sees Psychologist, sport and exercise or her gyn. She also has appt with her gyn in the next few weeks.      Return in about 3 months (around 05/05/2016), or if symptoms worsen or fail to  improve, for 2-3 months .   Face to face from 1:45 pm-2:19 pm.  > 50% of time was dedicated to discussion of FMLA issues and indications, general management of breast cancer, and side effects of hormonal therapy.   -[name] was advised to return if new concerns arise. She is planning on keeping appt with Dr Sherren Mocha.       Regina Mendez G. [place], MD  [facility]. [place] office.

## 2016-02-08 ENCOUNTER — Telehealth: Payer: Self-pay | Admitting: Family Medicine

## 2016-02-08 ENCOUNTER — Ambulatory Visit (INDEPENDENT_AMBULATORY_CARE_PROVIDER_SITE_OTHER): Payer: BC Managed Care – PPO | Admitting: Psychology

## 2016-02-08 ENCOUNTER — Other Ambulatory Visit: Payer: Self-pay

## 2016-02-08 ENCOUNTER — Ambulatory Visit (INDEPENDENT_AMBULATORY_CARE_PROVIDER_SITE_OTHER): Payer: BC Managed Care – PPO | Admitting: Gynecology

## 2016-02-08 ENCOUNTER — Encounter: Payer: Self-pay | Admitting: Gynecology

## 2016-02-08 VITALS — BP 118/74 | Ht 64.0 in | Wt 127.0 lb

## 2016-02-08 DIAGNOSIS — Z79890 Hormone replacement therapy (postmenopausal): Secondary | ICD-10-CM

## 2016-02-08 DIAGNOSIS — N952 Postmenopausal atrophic vaginitis: Secondary | ICD-10-CM

## 2016-02-08 DIAGNOSIS — Z01411 Encounter for gynecological examination (general) (routine) with abnormal findings: Secondary | ICD-10-CM | POA: Diagnosis not present

## 2016-02-08 DIAGNOSIS — F411 Generalized anxiety disorder: Secondary | ICD-10-CM | POA: Diagnosis not present

## 2016-02-08 NOTE — Telephone Encounter (Signed)
Spoke to pt and she expressed her concern. I suggested her that she keep her appointment for tomorrow and discuss concerns with Dr. Sherren Mocha.

## 2016-02-08 NOTE — Patient Instructions (Signed)
Follow up with the doctors at [facility] as arranged.  You may obtain a copy of any labs that were done today by logging onto MyChart as outlined in the instructions provided with your AVS (after visit summary). The office will not call with normal lab results but certainly if there are any significant abnormalities then we will contact you.   Health Maintenance Adopting a healthy lifestyle and getting preventive care can go a long way to promote health and wellness. Talk with your health care provider about what schedule of regular examinations is right for you. This is a good chance for you to check in with your provider about disease prevention and staying healthy. In between checkups, there are plenty of things you can do on your own. Experts have done a lot of research about which lifestyle changes and preventive measures are most likely to keep you healthy. Ask your health care provider for more information. WEIGHT AND DIET  Eat a healthy diet  Be sure to include plenty of vegetables, fruits, low-fat dairy products, and lean protein.  Do not eat a lot of foods high in solid fats, added sugars, or salt.  Get regular exercise. This is one of the most important things you can do for your health.  Most adults should exercise for at least 150 minutes each week. The exercise should increase your heart rate and make you sweat (moderate-intensity exercise).  Most adults should also do strengthening exercises at least twice a week. This is in addition to the moderate-intensity exercise.  Maintain a healthy weight  Body mass index (BMI) is a measurement that can be used to identify possible weight problems. It estimates body fat based on height and weight. Your health care provider can help determine your BMI and help you achieve or maintain a healthy weight.  For females 42 years of age and older:   A BMI below 18.5 is considered underweight.  A BMI of 18.5 to 24.9 is normal.  A BMI of  25 to 29.9 is considered overweight.  A BMI of 30 and above is considered obese.  Watch levels of cholesterol and blood lipids  You should start having your blood tested for lipids and cholesterol at 55 years of age, then have this test every 5 years.  You may need to have your cholesterol levels checked more often if:  Your lipid or cholesterol levels are high.  You are older than 55 years of age.  You are at high risk for heart disease.  CANCER SCREENING   Lung Cancer  Lung cancer screening is recommended for adults 87-52 years old who are at high risk for lung cancer because of a history of smoking.  A yearly low-dose CT scan of the lungs is recommended for people who:  Currently smoke.  Have quit within the past 15 years.  Have at least a 30-pack-year history of smoking. A pack year is smoking an average of one pack of cigarettes a day for 1 year.  Yearly screening should continue until it has been 15 years since you quit.  Yearly screening should stop if you develop a health problem that would prevent you from having lung cancer treatment.  Breast Cancer  Practice breast self-awareness. This means understanding how your breasts normally appear and feel.  It also means doing regular breast self-exams. Let your health care provider know about any changes, no matter how small.  If you are in your 20s or 30s, you should have a  clinical breast exam (CBE) by a health care provider every 1-3 years as part of a regular health exam.  If you are 40 or older, have a CBE every year. Also consider having a breast X-ray (mammogram) every year.  If you have a family history of breast cancer, talk to your health care provider about genetic screening.  If you are at high risk for breast cancer, talk to your health care provider about having an MRI and a mammogram every year.  Breast cancer gene (BRCA) assessment is recommended for women who have family members with BRCA-related  cancers. BRCA-related cancers include:  Breast.  Ovarian.  Tubal.  Peritoneal cancers.  Results of the assessment will determine the need for genetic counseling and BRCA1 and BRCA2 testing. Cervical Cancer Routine pelvic examinations to screen for cervical cancer are no longer recommended for nonpregnant women who are considered low risk for cancer of the pelvic organs (ovaries, uterus, and vagina) and who do not have symptoms. A pelvic examination may be necessary if you have symptoms including those associated with pelvic infections. Ask your health care provider if a screening pelvic exam is right for you.   The Pap test is the screening test for cervical cancer for women who are considered at risk.  If you had a hysterectomy for a problem that was not cancer or a condition that could lead to cancer, then you no longer need Pap tests.  If you are older than 65 years, and you have had normal Pap tests for the past 10 years, you no longer need to have Pap tests.  If you have had past treatment for cervical cancer or a condition that could lead to cancer, you need Pap tests and screening for cancer for at least 20 years after your treatment.  If you no longer get a Pap test, assess your risk factors if they change (such as having a new sexual partner). This can affect whether you should start being screened again.  Some women have medical problems that increase their chance of getting cervical cancer. If this is the case for you, your health care provider may recommend more frequent screening and Pap tests.  The human papillomavirus (HPV) test is another test that may be used for cervical cancer screening. The HPV test looks for the virus that can cause cell changes in the cervix. The cells collected during the Pap test can be tested for HPV.  The HPV test can be used to screen women 30 years of age and older. Getting tested for HPV can extend the interval between normal Pap tests from  three to five years.  An HPV test also should be used to screen women of any age who have unclear Pap test results.  After 55 years of age, women should have HPV testing as often as Pap tests.  Colorectal Cancer  This type of cancer can be detected and often prevented.  Routine colorectal cancer screening usually begins at 55 years of age and continues through 55 years of age.  Your health care provider may recommend screening at an earlier age if you have risk factors for colon cancer.  Your health care provider may also recommend using home test kits to check for hidden blood in the stool.  A small camera at the end of a tube can be used to examine your colon directly (sigmoidoscopy or colonoscopy). This is done to check for the earliest forms of colorectal cancer.  Routine screening usually begins at   age 50.  Direct examination of the colon should be repeated every 5-10 years through 55 years of age. However, you may need to be screened more often if early forms of precancerous polyps or small growths are found. Skin Cancer  Check your skin from head to toe regularly.  Tell your health care provider about any new moles or changes in moles, especially if there is a change in a mole's shape or color.  Also tell your health care provider if you have a mole that is larger than the size of a pencil eraser.  Always use sunscreen. Apply sunscreen liberally and repeatedly throughout the day.  Protect yourself by wearing long sleeves, pants, a wide-brimmed hat, and sunglasses whenever you are outside. HEART DISEASE, DIABETES, AND HIGH BLOOD PRESSURE   Have your blood pressure checked at least every 1-2 years. High blood pressure causes heart disease and increases the risk of stroke.  If you are between 55 years and 79 years old, ask your health care provider if you should take aspirin to prevent strokes.  Have regular diabetes screenings. This involves taking a blood sample to check  your fasting blood sugar level.  If you are at a normal weight and have a low risk for diabetes, have this test once every three years after 55 years of age.  If you are overweight and have a high risk for diabetes, consider being tested at a younger age or more often. PREVENTING INFECTION  Hepatitis B  If you have a higher risk for hepatitis B, you should be screened for this virus. You are considered at high risk for hepatitis B if:  You were born in a country where hepatitis B is common. Ask your health care provider which countries are considered high risk.  Your parents were born in a high-risk country, and you have not been immunized against hepatitis B (hepatitis B vaccine).  You have HIV or AIDS.  You use needles to inject street drugs.  You live with someone who has hepatitis B.  You have had sex with someone who has hepatitis B.  You get hemodialysis treatment.  You take certain medicines for conditions, including cancer, organ transplantation, and autoimmune conditions. Hepatitis C  Blood testing is recommended for:  Everyone born from 1945 through 1965.  Anyone with known risk factors for hepatitis C. Sexually transmitted infections (STIs)  You should be screened for sexually transmitted infections (STIs) including gonorrhea and chlamydia if:  You are sexually active and are younger than 55 years of age.  You are older than 55 years of age and your health care provider tells you that you are at risk for this type of infection.  Your sexual activity has changed since you were last screened and you are at an increased risk for chlamydia or gonorrhea. Ask your health care provider if you are at risk.  If you do not have HIV, but are at risk, it may be recommended that you take a prescription medicine daily to prevent HIV infection. This is called pre-exposure prophylaxis (PrEP). You are considered at risk if:  You are sexually active and do not regularly use  condoms or know the HIV status of your partner(s).  You take drugs by injection.  You are sexually active with a partner who has HIV. Talk with your health care provider about whether you are at high risk of being infected with HIV. If you choose to begin PrEP, you should first be tested for HIV. You   should then be tested every 3 months for as long as you are taking PrEP.  PREGNANCY   If you are premenopausal and you may become pregnant, ask your health care provider about preconception counseling.  If you may become pregnant, take 400 to 800 micrograms (mcg) of folic acid every day.  If you want to prevent pregnancy, talk to your health care provider about birth control (contraception). OSTEOPOROSIS AND MENOPAUSE   Osteoporosis is a disease in which the bones lose minerals and strength with aging. This can result in serious bone fractures. Your risk for osteoporosis can be identified using a bone density scan.  If you are 65 years of age or older, or if you are at risk for osteoporosis and fractures, ask your health care provider if you should be screened.  Ask your health care provider whether you should take a calcium or vitamin D supplement to lower your risk for osteoporosis.  Menopause may have certain physical symptoms and risks.  Hormone replacement therapy may reduce some of these symptoms and risks. Talk to your health care provider about whether hormone replacement therapy is right for you.  HOME CARE INSTRUCTIONS   Schedule regular health, dental, and eye exams.  Stay current with your immunizations.   Do not use any tobacco products including cigarettes, chewing tobacco, or electronic cigarettes.  If you are pregnant, do not drink alcohol.  If you are breastfeeding, limit how much and how often you drink alcohol.  Limit alcohol intake to no more than 1 drink per day for nonpregnant women. One drink equals 12 ounces of beer, 5 ounces of wine, or 1 ounces of hard  liquor.  Do not use street drugs.  Do not share needles.  Ask your health care provider for help if you need support or information about quitting drugs.  Tell your health care provider if you often feel depressed.  Tell your health care provider if you have ever been abused or do not feel safe at home. Document Released: 08/02/2010 Document Revised: 06/03/2013 Document Reviewed: 12/19/2012 ExitCare Patient Information 2015 [employer]. This information is not intended to replace advice given to you by your health care provider. Make sure you discuss any questions you have with your health care provider.  

## 2016-02-08 NOTE — Telephone Encounter (Signed)
Pt has CPE on 02/15/16. However, pt has questions about upcoming appts due to breast biopsy.  Prefers to go to [facility] and wants to discuss issues with Dr Sherren Mocha. Pt wants to make sure Dr Sherren Mocha is ok with her coming in tomorrow to discuss this issue, so she will have more time for her physical the following week. Pt will be back home after 2 pm but ok to leave a message.

## 2016-02-08 NOTE — Progress Notes (Signed)
    Regina Mendez [birth year] [id number]        56 y.o.  G2P2002 for annual exam.    Past medical history,surgical history, problem list, medications, allergies, family history and social history were all reviewed and documented as reviewed in the EPIC chart.  ROS:  Performed with pertinent positives and negatives included in the history, assessment and plan.   Additional significant findings :  None   Exam: [name] assistant Vitals:   02/08/16 1610  BP: 118/74  Weight: 127 lb (57.6 kg)  Height: 5\' 4"  (1.626 m)   Body mass index is 21.8 kg/m.  General appearance:  Normal affect, orientation and appearance. Skin: Grossly normal HEENT: Without gross lesions.  No cervical or supraclavicular adenopathy. Thyroid normal.  Lungs:  Clear without wheezing, rales or rhonchi Cardiac: RR, without RMG Abdominal:  Soft, nontender, without masses, guarding, rebound, organomegaly or hernia Breasts:  Examined lying and sitting. Both breasts are dense but are without definitive masses, retractions, discharge or axillary adenopathy. Pelvic:  Ext, BUS, Vagina with atrophic changes  Cervix with atrophic changes  Uterus anteverted, normal size, shape and contour, midline and mobile nontender   Adnexa without masses or tenderness    Anus and perineum normal   Rectovaginal normal sphincter tone without palpated masses or tenderness.    Assessment/Plan:  56 y.o. G24P2002 female for annual exam.   1. Postmenopausal/atrophic genital changes. Had been on Vivelle 0.075 patches and Prometrium 100 mg nightly. 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 Was scheduled for a separate right lumpectomy biopsy because of lack of correlation between the biopsy and radiographic appearance. Patient felt uncomfortable proceeding with this lumpectomy locally and she's made an appointment to follow up with the breast clinic at [facility] for a second opinion and possible surgery there. Her  exam today shows dense breasts but no isolated definitive masses bilaterally. She had called and discussed her HRT with [place] and my recommendation was to go ahead and wean off of her HRT now.  She started replacement a number of years ago and I feel is worth an attempt to discontinue now. Certainly if there is any question of malignancy this is the appropriate choice. If follow up ultimately does not show any question of malignancy and she does well after stopping the HRT she will continue off of this. If she would develop unacceptable estrogen deficient symptoms then we'll rediscuss whether to reinitiate HRT or choose an alternative. She'll follow up with me after the breast issue is resolved and seeing how she is doing off of HRT. 2. Colonoscopy 2012. Is due now for colonoscopy and she is calling to arrange this with Dr. Collene Mares. 3. Pap smear 2017. No Pap smear done today. History of cryosurgery in her 55s with normal Pap smears afterwards. 4. DEXA reported normal 2010. Will plan repeat DEXA further into the menopause. Increased calcium vitamin D. 5. Health maintenance. No routine lab work done as patient has appointment scheduled next week with her primary physician. Follow up with me if any issues after discontinuing HRT or in one year for annual exam.   [name] MD, 4:39 PM 02/08/2016

## 2016-02-09 ENCOUNTER — Ambulatory Visit: Payer: Self-pay | Admitting: Family Medicine

## 2016-02-09 ENCOUNTER — Other Ambulatory Visit (INDEPENDENT_AMBULATORY_CARE_PROVIDER_SITE_OTHER): Payer: BC Managed Care – PPO

## 2016-02-09 DIAGNOSIS — Z0000 Encounter for general adult medical examination without abnormal findings: Secondary | ICD-10-CM | POA: Diagnosis not present

## 2016-02-09 LAB — POC URINALSYSI DIPSTICK (AUTOMATED)
BILIRUBIN UA: NEGATIVE
GLUCOSE UA: NEGATIVE
KETONES UA: NEGATIVE
LEUKOCYTES UA: NEGATIVE
NITRITE UA: NEGATIVE
Protein, UA: NEGATIVE
Spec Grav, UA: 1.015
Urobilinogen, UA: 0.2
pH, UA: 7

## 2016-02-09 LAB — CBC WITH DIFFERENTIAL/PLATELET
BASOS PCT: 0.5 % (ref 0.0–3.0)
Basophils Absolute: 0 10*3/uL (ref 0.0–0.1)
EOS PCT: 1.5 % (ref 0.0–5.0)
Eosinophils Absolute: 0.1 10*3/uL (ref 0.0–0.7)
HEMATOCRIT: 37.9 % (ref 36.0–46.0)
HEMOGLOBIN: 12.9 g/dL (ref 12.0–15.0)
Lymphocytes Relative: 35.5 % (ref 12.0–46.0)
Lymphs Abs: 2.3 10*3/uL (ref 0.7–4.0)
MCHC: 34.2 g/dL (ref 30.0–36.0)
MCV: 93.9 fl (ref 78.0–100.0)
MONO ABS: 0.7 10*3/uL (ref 0.1–1.0)
Monocytes Relative: 11.5 % (ref 3.0–12.0)
NEUTROS ABS: 3.3 10*3/uL (ref 1.4–7.7)
Neutrophils Relative %: 51 % (ref 43.0–77.0)
PLATELETS: 297 10*3/uL (ref 150.0–400.0)
RBC: 4.04 Mil/uL (ref 3.87–5.11)
RDW: 14.1 % (ref 11.5–15.5)
WBC: 6.5 10*3/uL (ref 4.0–10.5)

## 2016-02-09 LAB — HEPATIC FUNCTION PANEL
ALT: 11 U/L (ref 0–35)
AST: 16 U/L (ref 0–37)
Albumin: 3.8 g/dL (ref 3.5–5.2)
Alkaline Phosphatase: 39 U/L (ref 39–117)
BILIRUBIN TOTAL: 0.5 mg/dL (ref 0.2–1.2)
Bilirubin, Direct: 0.1 mg/dL (ref 0.0–0.3)
TOTAL PROTEIN: 6.8 g/dL (ref 6.0–8.3)

## 2016-02-09 LAB — LIPID PANEL
CHOLESTEROL: 244 mg/dL — AB (ref 0–200)
HDL: 50.2 mg/dL (ref >39.00)
LDL Cholesterol: 171 mg/dL — ABNORMAL HIGH (ref 0–99)
NonHDL: 194.28
TRIGLYCERIDES: 118 mg/dL (ref 0.0–149.0)
Total CHOL/HDL Ratio: 5
VLDL: 23.6 mg/dL (ref 0.0–40.0)

## 2016-02-09 LAB — BASIC METABOLIC PANEL
BUN: 10 mg/dL (ref 6–23)
CO2: 29 meq/L (ref 19–32)
Calcium: 8.9 mg/dL (ref 8.4–10.5)
Chloride: 102 mEq/L (ref 96–112)
Creatinine, Ser: 0.81 mg/dL (ref 0.40–1.20)
GFR: 78.08 mL/min (ref >60.00)
Glucose, Bld: 84 mg/dL (ref 70–99)
POTASSIUM: 3.9 meq/L (ref 3.5–5.1)
Sodium: 137 mEq/L (ref 135–145)

## 2016-02-09 LAB — TSH: TSH: 1.1 u[IU]/mL (ref 0.35–4.50)

## 2016-02-15 ENCOUNTER — Ambulatory Visit (INDEPENDENT_AMBULATORY_CARE_PROVIDER_SITE_OTHER): Payer: BC Managed Care – PPO | Admitting: Family Medicine

## 2016-02-15 ENCOUNTER — Encounter: Payer: Self-pay | Admitting: Family Medicine

## 2016-02-15 VITALS — BP 98/60 | HR 84 | Temp 97.3°F | Ht 64.0 in | Wt 129.9 lb

## 2016-02-15 DIAGNOSIS — R21 Rash and other nonspecific skin eruption: Secondary | ICD-10-CM | POA: Diagnosis not present

## 2016-02-15 DIAGNOSIS — Z0000 Encounter for general adult medical examination without abnormal findings: Secondary | ICD-10-CM

## 2016-02-15 DIAGNOSIS — M25561 Pain in right knee: Secondary | ICD-10-CM | POA: Diagnosis not present

## 2016-02-15 DIAGNOSIS — J309 Allergic rhinitis, unspecified: Secondary | ICD-10-CM

## 2016-02-15 DIAGNOSIS — M25562 Pain in left knee: Secondary | ICD-10-CM

## 2016-02-15 MED ORDER — CLINDAMYCIN PHOSPHATE 1 % EX SOLN: Freq: Two times a day (BID) | CUTANEOUS | 5 refills | Status: DC

## 2016-02-15 MED ORDER — TRETINOIN 0.05 % EX CREA: TOPICAL_CREAM | Freq: Every day | CUTANEOUS | 5 refills | Status: DC

## 2016-02-15 NOTE — Progress Notes (Signed)
Regina Mendez is a 56 year old female nonsmoker who comes in today for general physical examination  She has a history of severe menopausal symptoms. She's followed by GYN. She's on a combination of estrogen and progesterone. Most recently she had an abnormal mammogram which required a biopsy. She's due to have an excision of this lesion ASAP. She recently consulted the surgeons here in [place] but [place] [facility] [place]  She uses Retin-A and Cleocin T for her skin adult acne.  When she has a cold triggers her asthma and she uses albuterol and Qvar. In 2017 she's not had to use either medication.  She gets routine eye care, dental care, BSE monthly, annual mammography, colonoscopy she was scheduled to see Dr. Collene Mares but changed her appointment. Asked to go ahead and see Dr. Collene Mares for a colonoscopy since she is due.  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 The of malaise because of stress related to the upcoming breast surgery.  14 point review of systems reviewed and otherwise negative  Vaccinations up-to-date  BP 98/60 (BP Location: Left Arm, Patient Position: Sitting, Cuff Size: Normal)   Pulse 84   Temp 97.3 F (36.3 C) (Oral)   Ht 5\' 4"  (1.626 m)   Wt 129 lb 14.4 oz (58.9 kg)   LMP 08/13/2012   BMI 22.30 kg/m  Examination HEENT were negative except for [place] and left lower eyelid she's seeing the ophthalmologist. Neck was supple no abnormal thyroid normal cardiopulmonary exam normal abdominal exam normal breast pelvic and rectal done by GYN therefore deferred  Extremities normal skin normal peripheral pulses normal  Impression healthy female  #2 severe menopausal symptoms....... followed by GYN  #3 adult acne......Marland Kitchen refill Cleocin and Retin-A  #4 history of asthma triggered by viral infections currently asymptomatic....... albuterol and Qvar when necessary  #5 anxiety related to upcoming breast surgery....... patient requesting that I  sign an FMLA

## 2016-02-15 NOTE — Patient Instructions (Signed)
If you develop a viral infection the triggers your wheezing restart the albuterol and Qvar  Cleocin T and Retin-A at bedtime as needed for the adult acne  Follow-up in one year sooner if any problems

## 2016-02-22 ENCOUNTER — Telehealth: Payer: Self-pay

## 2016-02-22 DIAGNOSIS — N6489 Other specified disorders of breast: Secondary | ICD-10-CM | POA: Insufficient documentation

## 2016-02-22 NOTE — Telephone Encounter (Signed)
Received PA request from [facility] for Tretinoin. PA submitted & is pending. Key: Regina Mendez

## 2016-02-23 NOTE — Telephone Encounter (Signed)
PA approved, form faxed back to pharmacy. 

## 2016-03-21 ENCOUNTER — Ambulatory Visit (INDEPENDENT_AMBULATORY_CARE_PROVIDER_SITE_OTHER): Payer: BC Managed Care – PPO | Admitting: Psychology

## 2016-03-21 DIAGNOSIS — F411 Generalized anxiety disorder: Secondary | ICD-10-CM

## 2016-04-06 ENCOUNTER — Ambulatory Visit: Payer: BC Managed Care – PPO | Admitting: Psychology

## 2016-05-04 ENCOUNTER — Ambulatory Visit (INDEPENDENT_AMBULATORY_CARE_PROVIDER_SITE_OTHER): Payer: BC Managed Care – PPO | Admitting: Gynecology

## 2016-05-04 ENCOUNTER — Encounter: Payer: Self-pay | Admitting: Gynecology

## 2016-05-04 VITALS — BP 120/74

## 2016-05-04 DIAGNOSIS — N951 Menopausal and female climacteric states: Secondary | ICD-10-CM | POA: Diagnosis not present

## 2016-05-04 DIAGNOSIS — N6002 Solitary cyst of left breast: Secondary | ICD-10-CM

## 2016-05-04 DIAGNOSIS — N644 Mastodynia: Secondary | ICD-10-CM | POA: Diagnosis not present

## 2016-05-04 NOTE — Progress Notes (Signed)
    Joseline Mccampbell [birth year] [id number]        56 y.o.  [id number]  Presents with 2 issues:  1. Left breast tenderness with firm area palpated last night. History of [place] right breast with excisional biopsy in December. 2. Menopausal symptoms. With her diagnosis of [place] she stopped her HRT and has been having hot flushes and sweats.  Past medical history,surgical history, problem list, medications, allergies, family history and social history were all reviewed and documented in the EPIC chart.  Directed ROS with pertinent positives and negatives documented in the history of present illness/assessment and plan.  Exam:Kim Gardnerstant Vitals:   05/04/16 1520  BP: 120/74   General appearance:  Normal Both breasts examined. Right with well-healed lumpectomy scar. No masses, retractions, discharge or axillary adenopathy. Left with firm 3 cm cystic feeling mass 3:00 lateral position of the breast. No other masses, retractions, discharge or axillary adenopathy. Physical Exam  Pulmonary/Chest:       Procedure: The skin overlying the cystic mass was cleansed with alcohol, infiltrated with 1% lidocaine and 4 cc of clear yellow fluid was removed. The mass completely resolved.    Assessment/Plan:  56 y.o. [id number] With:  1. Left breast cyst, aspirated and completely resolved. Patient will continue self breast exams as long as this area remains gone them will follow. If any recurrence or other new finding she will represent for evaluation. 2. Menopausal symptoms since stopping HRT. Options reviewed to include observation, OTC products and reinitiation of HRT. At this point the patient wants to wait and monitor her symptoms. She will call if she wants to rediscuss treatment options. She will call if she has any vaginal bleeding.     [name] MD, 3:58 PM 05/04/2016

## 2016-05-04 NOTE — Patient Instructions (Signed)
Follow-up routinely when due for annual exam 

## 2016-07-13 ENCOUNTER — Telehealth: Payer: Self-pay | Admitting: Family Medicine

## 2016-07-13 DIAGNOSIS — Z1211 Encounter for screening for malignant neoplasm of colon: Secondary | ICD-10-CM

## 2016-07-13 NOTE — Telephone Encounter (Signed)
Patient is calling in today to request a referral to [facility] to Dr. Virgia Land.  Phone is [phone]

## 2016-07-15 NOTE — Telephone Encounter (Signed)
Spoke to the pt.  She stated referral is for colonoscopy.  She has checked with her insurance.

## 2016-08-22 ENCOUNTER — Ambulatory Visit (INDEPENDENT_AMBULATORY_CARE_PROVIDER_SITE_OTHER): Payer: BC Managed Care – PPO | Admitting: Family Medicine

## 2016-08-22 ENCOUNTER — Encounter: Payer: Self-pay | Admitting: Family Medicine

## 2016-08-22 VITALS — BP 113/71 | HR 79 | Temp 97.8°F | Resp 16 | Ht 64.0 in | Wt 129.0 lb

## 2016-08-22 DIAGNOSIS — T63481A Toxic effect of venom of other arthropod, accidental (unintentional), initial encounter: Secondary | ICD-10-CM

## 2016-08-22 DIAGNOSIS — T63461A Toxic effect of venom of wasps, accidental (unintentional), initial encounter: Secondary | ICD-10-CM

## 2016-08-22 MED ORDER — PREDNISONE 20 MG PO TABS: 40.0000 mg | ORAL_TABLET | Freq: Once | ORAL | 0 refills | Status: AC

## 2016-08-22 MED ORDER — CEPHALEXIN 500 MG PO CAPS: 500.0000 mg | ORAL_CAPSULE | Freq: Two times a day (BID) | ORAL | 0 refills | Status: DC

## 2016-08-22 NOTE — Patient Instructions (Addendum)
The swelling on your arm still could be a large local reaction from wasp or hornet sting. However based on the area of involvement, I will also cover for possible skin infection or cellulitis. For large local reaction, take to prednisone once today. Continue with antihistamines such as Claritin or Zyrtec over-the-counter during the day, or if stronger medication needed, Benadryl up to every 4-6 hours. Cool compresses over area may help, wash with soap and water at least once per day.  If redness is continuing to increase significantly tomorrow, return for recheck. Otherwise redness and swelling should improve in the next 48 hours. If not, please return for recheck.  Return to the clinic or go to the nearest emergency room if any of your symptoms worsen or new symptoms occur.    Cellulitis, Adult Cellulitis is a skin infection. The infected area is usually red and tender. This condition occurs most often in the arms and lower legs. The infection can travel to the muscles, blood, and underlying tissue and become serious. It is very important to get treated for this condition. What are the causes? Cellulitis is caused by bacteria. The bacteria enter through a break in the skin, such as a cut, burn, insect bite, open sore, or crack. What increases the risk? This condition is more likely to occur in people who:  Have a weak defense system (immune system).  Have open wounds on the skin such as cuts, burns, bites, and scrapes. Bacteria can enter the body through these open wounds.  Are older.  Have diabetes.  Have a type of long-lasting (chronic) liver disease (cirrhosis) or kidney disease.  Use IV drugs.  What are the signs or symptoms? Symptoms of this condition include:  Redness, streaking, or spotting on the skin.  Swollen area of the skin.  Tenderness or pain when an area of the skin is touched.  Warm skin.  Fever.  Chills.  Blisters.  How is this diagnosed? This condition  is diagnosed based on a medical history and physical exam. You may also have tests, including:  Blood tests.  Lab tests.  Imaging tests.  How is this treated? Treatment for this condition may include:  Medicines, such as antibiotic medicines or antihistamines.  Supportive care, such as rest and application of cold or warm cloths (cold or warm compresses) to the skin.  Hospital care, if the condition is severe.  The infection usually gets better within 1-2 days of treatment. Follow these instructions at home:  Take over-the-counter and prescription medicines only as told by your health care provider.  If you were prescribed an antibiotic medicine, take it as told by your health care provider. Do not stop taking the antibiotic even if you start to feel better.  Drink enough fluid to keep your urine clear or pale yellow.  Do not touch or rub the infected area.  Raise (elevate) the infected area above the level of your heart while you are sitting or lying down.  Apply warm or cold compresses to the affected area as told by your health care provider.  Keep all follow-up visits as told by your health care provider. This is important. These visits let your health care provider make sure a more serious infection is not developing. Contact a health care provider if:  You have a fever.  Your symptoms do not improve within 1-2 days of starting treatment.  Your bone or joint underneath the infected area becomes painful after the skin has healed.  Your infection  returns in the same area or another area.  You notice a swollen bump in the infected area.  You develop new symptoms.  You have a general ill feeling (malaise) with muscle aches and pains. Get help right away if:  Your symptoms get worse.  You feel very sleepy.  You develop vomiting or diarrhea that persists.  You notice red streaks coming from the infected area.  Your red area gets larger or turns dark in  color. This information is not intended to replace advice given to you by your health care provider. Make sure you discuss any questions you have with your health care provider. Document Released: 10/27/2004 Document Revised: 05/28/2015 Document Reviewed: 11/26/2014 Elsevier Interactive Patient Education  2017 [place], [place], or [employer], Adult Bees, wasps, and hornets are part of a family of insects that can sting people. These stings can cause pain and inflammation, but they are usually not serious. However, some people may have an allergic reaction to a sting. This can cause the symptoms to be more severe. What increases the risk? You may be at a greater risk of getting stung if you:  Provoke a stinging insect by swatting or disturbing it.  Wear strong-smelling soaps, deodorants, or body sprays.  Spend time outdoors near gardens with flowers or fruit trees or in clothes that expose skin.  Eat or drink outside.  What are the signs or symptoms? Common symptoms of this condition include:  A red lump in the skin that sometimes has a tiny hole in the center. In some cases, a stinger may be in the center of the wound.  Pain and itching at the sting site.  Redness and swelling around the sting site. If you have an allergic reaction (localized allergic reaction), the swelling and redness may spread out from the sting site. In some cases, this reaction can continue to develop over the next 24-48 hours.  In rare cases, a person may have a severe allergic reaction (anaphylactic reaction) to a sting. Symptoms of an anaphylactic reaction may include:  Wheezing or difficulty breathing.  Raised, itchy, red patches on the skin (hives).  Nausea or vomiting.  Abdominal cramping.  Diarrhea.  Tightness in the chest or chest pain.  Dizziness or fainting.  Redness of the face (flushing).  Hoarse voice.  Swollen tongue, lips, or face.  How is this diagnosed? This  condition is usually diagnosed based on your symptoms and medical history as well as a physical exam. You may have an allergy test to determine if you are allergic to the substance that the insect injected during the sting (venom). How is this treated? If you were stung by a bee, the stinger and a small sac of venom may be in the wound. It is important to remove the stinger as soon as possible. You can do this by brushing across the wound with gauze, a fingernail, or a flat card such as a credit card. Removing the stinger can help reduce the severity of your body's reaction to the sting. Most stings can be treated with:  Icing to reduce swelling in the area.  Medicines (antihistamines) to treat itching or an allergic reaction.  Medicines to help reduce pain. These may be medicines that you take by mouth, or medicated creams or lotions that you apply to your skin.  Pay close attention to your symptoms after you have been stung. If possible, have someone stay with you to make sure you do not have an  allergic reaction. If you have any signs of an allergic reaction, call your health care provider. If you have ever had a severe allergic reaction, your health care provider may give you an inhaler or injectable medicine (epinephrine auto-injector) to use if necessary. Follow these instructions at home:  Wash the sting site 2-3 times each day with soap and water as told by your health care provider.  Apply or take over-the-counter and prescription medicines only as told by your health care provider.  If directed, apply ice to the sting area. ? Put ice in a plastic bag. ? Place a towel between your skin and the bag. ? Leave the ice on for 20 minutes, 2-3 times a day.  Do not scratch the sting area.  If you had a severe allergic reaction to a sting, you may need: ? To wear a medical bracelet or necklace that lists the allergy. ? To learn when and how to use an anaphylaxis kit or epinephrine  injection. Your family members and coworkers may also need to learn this. ? To carry an anaphylaxis kit or epinephrine injection with you at all times. How is this prevented?  Avoid swatting at stinging insects and disturbing insect nests.  Do not use fragrant soaps or lotions.  Wear shoes, pants, and long sleeves when spending time outdoors, especially in grassy areas where stinging insects are common.  Keep outdoor areas free from nests or hives.  Keep food and drink containers covered when eating outdoors.  Avoid working or sitting near [employer], if possible.  Wear gloves if you are gardening or working outdoors.  If an attack by a stinging insect or a swarm seems likely in the moment, move away from the area or find a barrier between you and the insect(s), such as a door. Contact a health care provider if:  Your symptoms do not get better in 2-3 days.  You have redness, swelling, or pain that spreads beyond the area of the sting.  You have a fever. Get help right away if: You have symptoms of a severe allergic reaction. These include:  Wheezing or difficulty breathing.  Tightness in the chest or chest pain.  Light-headedness or fainting.  Itchy, raised, red patches on the skin.  Nausea or vomiting.  Abdominal cramping.  Diarrhea.  A swollen tongue or lips, or trouble swallowing.  Dizziness or fainting.  Summary  Stings from bees, wasps, and hornets can cause pain and inflammation, but they are usually not serious. However, some people may have an allergic reaction to a sting. This can cause the symptoms to be more severe.  Pay close attention to your symptoms after you have been stung. If possible, have someone stay with you to make sure you do not have an allergic reaction.  Call your health care provider if you have any signs of an allergic reaction. This information is not intended to replace advice given to you by your health care provider. Make  sure you discuss any questions you have with your health care provider. Document Released: 01/17/2005 Document Revised: 03/24/2016 Document Reviewed: 03/24/2016 Elsevier Interactive Patient Education  2018 [employer].     IF you received an x-ray today, you will receive an invoice from [facility]. Please contact [facility] at [phone] with questions or concerns regarding your invoice.   IF you received labwork today, you will receive an invoice from [facility]. Please contact [facility] at [phone] with questions or concerns regarding your invoice.   Our billing  staff will not be able to assist you with questions regarding bills from these companies.  You will be contacted with the lab results as soon as they are available. The fastest way to get your results is to activate your My Chart account. Instructions are located on the last page of this paperwork. If you have not heard from us regarding the results in 2 weeks, please contact this office.      

## 2016-08-22 NOTE — Progress Notes (Signed)
Subjective:  By signing my name below, I, Essence Howell, attest that this documentation has been prepared under the direction and in the presence of Wendie Agreste, MD Electronically Signed: Ladene Artist, ED Scribe 08/22/2016 at 12:18 PM.   Patient ID: Regina Mendez, female    DOB: August 02, 1960, 56 y.o.   MRN: [id number]  Chief Complaint  Patient presents with  . Insect Bite    x3 days. Worsenning redness and swelling to right upper arm   HPI Regina Mendez is a 56 y.o. female who presents to Primary Care at [facility] complaining of an insect bite to the right upper arm x 3 days. Pt states that she was working outside in her garden when she was bitten by an unknown insect which she assumes was a wasp since she saw a cone-shaped wasp nest close by. Pt noticed gradually worsening redness and swelling within a few hours. She has noticed warmth and itching to the area as well. She took a dose of Benadryl last night. Pt denies fever, chills, tightness in throat, difficulty swallowing or sob. No bee allergy.   Patient Active Problem List   Diagnosis Date Noted  . Routine general medical examination at a health care facility 01/14/2014  . Chronic insomnia 10/26/2012  . Joint pain 08/16/2011  . Cervical dysplasia   . 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 Skin rash 08/02/2010  . Allergic rhinitis 09/07/2009  . MIGRAINE HEADACHE 07/05/2007  . LUMP OR MASS IN BREAST 07/05/2007  . MENOPAUSE, EARLY 07/05/2007   Past Medical History:  Diagnosis Date  . Acne   . Breast cyst   . Migraine headache    Past Surgical History:  Procedure Laterality Date  . APPENDECTOMY    . BREAST SURGERY     right breast bx  . COLPOSCOPY    . GYNECOLOGIC CRYOSURGERY  age 14  . OVARIAN CYST SURGERY    . TONSILLECTOMY     Allergies  Allergen Reactions  . Codeine Other (See Comments)    Drowsy  . Sulfonamide Derivatives Dermatitis   Prior to Admission medications   Medication Sig Start Date End Date  Taking? Authorizing Provider  albuterol (PROVENTIL HFA;VENTOLIN HFA) 108 (90 BASE) MCG/ACT inhaler Inhale 2 puffs into the lungs every 6 (six) hours as needed.     Yes [provider]  beclomethasone (QVAR) 40 MCG/ACT inhaler Inhale 2 puffs into the lungs 2 (two) times daily.     Yes [provider]  BIOTIN PO Take by mouth.   Yes [provider]  clindamycin (CLEOCIN-T) 1 % external solution Apply topically 2 (two) times daily. 02/15/16  Yes Dorena Cookey, MD  meloxicam (MOBIC) 7.5 MG tablet Take 1 tablet (7.5 mg total) by mouth daily. 12/12/15  Yes Wendie Agreste, MD  Multiple Vitamins-Minerals (HAIR/SKIN/NAILS PO) Take by mouth.   Yes [provider]  tretinoin (RETIN-A) 0.05 % cream Apply topically at bedtime. 02/15/16  Yes Dorena Cookey, MD  aspirin 81 MG tablet Take 81 mg by mouth daily.      [provider]   Social History   Social History  . Marital status: Married    Spouse name: N/A  . Number of children: N/A  . Years of education: N/A   Occupational History  . Not on file.   Social History Main Topics  . Smoking status: Never Smoker  . Smokeless tobacco: Never Used  . Alcohol use 0.6 oz/week    1 Standard drinks or  equivalent per week  . Drug use: No  . Sexual activity: Yes    Birth control/ protection: Post-menopausal     Comment: 1st intercourse 56 yo-More than 5 partners   Other Topics Concern  . Not on file   Social History Narrative  . No narrative on file   Review of Systems  Constitutional: Negative for chills and fever.  HENT: Negative for trouble swallowing.   Respiratory: Negative for shortness of breath.   Skin: Positive for color change.       +insect bite to R upper arm      Objective:   Physical Exam  Constitutional: She is oriented to person, place, and time. She appears well-developed and well-nourished. No distress.  HENT:  Head: Normocephalic and atraumatic.  Eyes: Conjunctivae and EOM  are normal.  Neck: Neck supple. No tracheal deviation present.  Cardiovascular: Normal rate and regular rhythm.   Pulmonary/Chest: Effort normal and breath sounds normal. No respiratory distress.  Musculoskeletal: Normal range of motion.  Neurological: She is alert and oriented to person, place, and time.  Skin: Skin is warm and dry.  Large are of erythema and slight induration to the R upper arm. Small puncture wound on the upper aspect. Outlined with marker. Measures 15 cm across at the upper aspect and 10 cm across at the lower aspect x 20 cm.  Psychiatric: She has a normal mood and affect. Her behavior is normal.  Nursing note and vitals reviewed.  Vitals:   08/22/16 1131  BP: 113/71  Pulse: 79  Resp: 16  Temp: 97.8 F (36.6 C)  TempSrc: Oral  SpO2: 99%  Weight: 129 lb (58.5 kg)  Height: 5' 4"  (1.626 m)      Assessment & Plan:    Regina Mendez is a 56 y.o. female Wasp sting, accidental or unintentional, initial encounter  Local reaction to insect sting, accidental or unintentional, initial encounter - Plan: cephALEXin (KEFLEX) 500 MG capsule, predniSONE (DELTASONE) 20 MG tablet Suspected wasp or hornet Sting based on history.  Could still be large local reaction as we are at approximate 72 hours from injury. Typically that peaks at 48 hours and then received within 5-7 days. However based on degree of involvement, cellulitis also in differential. Denies other systemic symptoms at this time. No previous wasp/hornet allergy known.  - Prednisone 40 mg 1 for large local reaction, continue antihistamine with either Benadryl or less sedating antihistamine over-the-counter.  - Symptomatic care for rash, start Keflex 500 mg twice a day for possible early cellulitis. RTC precautions if significant spread of erythema overnight or if not improving within 48 hours.  Meds ordered this encounter  Medications  . cephALEXin (KEFLEX) 500 MG capsule    Sig: Take 1 capsule (500 mg total) by  mouth 2 (two) times daily.    Dispense:  20 capsule    Refill:  0  . predniSONE (DELTASONE) 20 MG tablet    Sig: Take 2 tablets (40 mg total) by mouth once.    Dispense:  2 tablet    Refill:  0   Patient Instructions   The swelling on your arm still could be a large local reaction from wasp or hornet sting. However based on the area of involvement, I will also cover for possible skin infection or cellulitis. For large local reaction, take to prednisone once today. Continue with antihistamines such as Claritin or Zyrtec over-the-counter during the day, or if stronger medication needed, Benadryl up to every 4-6 hours. Cool  compresses over area may help, wash with soap and water at least once per day.  If redness is continuing to increase significantly tomorrow, return for recheck. Otherwise redness and swelling should improve in the next 48 hours. If not, please return for recheck.  Return to the clinic or go to the nearest emergency room if any of your symptoms worsen or new symptoms occur.    Cellulitis, Adult Cellulitis is a skin infection. The infected area is usually red and tender. This condition occurs most often in the arms and lower legs. The infection can travel to the muscles, blood, and underlying tissue and become serious. It is very important to get treated for this condition. What are the causes? Cellulitis is caused by bacteria. The bacteria enter through a break in the skin, such as a cut, burn, insect bite, open sore, or crack. What increases the risk? This condition is more likely to occur in people who:  Have a weak defense system (immune system).  Have open wounds on the skin such as cuts, burns, bites, and scrapes. Bacteria can enter the body through these open wounds.  Are older.  Have diabetes.  Have a type of long-lasting (chronic) liver disease (cirrhosis) or kidney disease.  Use IV drugs.  What are the signs or symptoms? Symptoms of this condition  include:  Redness, streaking, or spotting on the skin.  Swollen area of the skin.  Tenderness or pain when an area of the skin is touched.  Warm skin.  Fever.  Chills.  Blisters.  How is this diagnosed? This condition is diagnosed based on a medical history and physical exam. You may also have tests, including:  Blood tests.  Lab tests.  Imaging tests.  How is this treated? Treatment for this condition may include:  Medicines, such as antibiotic medicines or antihistamines.  Supportive care, such as rest and application of cold or warm cloths (cold or warm compresses) to the skin.  Hospital care, if the condition is severe.  The infection usually gets better within 1-2 days of treatment. Follow these instructions at home:  Take over-the-counter and prescription medicines only as told by your health care provider.  If you were prescribed an antibiotic medicine, take it as told by your health care provider. Do not stop taking the antibiotic even if you start to feel better.  Drink enough fluid to keep your urine clear or pale yellow.  Do not touch or rub the infected area.  Raise (elevate) the infected area above the level of your heart while you are sitting or lying down.  Apply warm or cold compresses to the affected area as told by your health care provider.  Keep all follow-up visits as told by your health care provider. This is important. These visits let your health care provider make sure a more serious infection is not developing. Contact a health care provider if:  You have a fever.  Your symptoms do not improve within 1-2 days of starting treatment.  Your bone or joint underneath the infected area becomes painful after the skin has healed.  Your infection returns in the same area or another area.  You notice a swollen bump in the infected area.  You develop new symptoms.  You have a general ill feeling (malaise) with muscle aches and pains. Get  help right away if:  Your symptoms get worse.  You feel very sleepy.  You develop vomiting or diarrhea that persists.  You notice red streaks coming from  the infected area.  Your red area gets larger or turns dark in color. This information is not intended to replace advice given to you by your health care provider. Make sure you discuss any questions you have with your health care provider. Document Released: 10/27/2004 Document Revised: 05/28/2015 Document Reviewed: 11/26/2014 Elsevier Interactive Patient Education  2017 [place], [place], or [employer], Adult Bees, wasps, and hornets are part of a family of insects that can sting people. These stings can cause pain and inflammation, but they are usually not serious. However, some people may have an allergic reaction to a sting. This can cause the symptoms to be more severe. What increases the risk? You may be at a greater risk of getting stung if you:  Provoke a stinging insect by swatting or disturbing it.  Wear strong-smelling soaps, deodorants, or body sprays.  Spend time outdoors near gardens with flowers or fruit trees or in clothes that expose skin.  Eat or drink outside.  What are the signs or symptoms? Common symptoms of this condition include:  A red lump in the skin that sometimes has a tiny hole in the center. In some cases, a stinger may be in the center of the wound.  Pain and itching at the sting site.  Redness and swelling around the sting site. If you have an allergic reaction (localized allergic reaction), the swelling and redness may spread out from the sting site. In some cases, this reaction can continue to develop over the next 24-48 hours.  In rare cases, a person may have a severe allergic reaction (anaphylactic reaction) to a sting. Symptoms of an anaphylactic reaction may include:  Wheezing or difficulty breathing.  Raised, itchy, red patches on the skin (hives).  Nausea or  vomiting.  Abdominal cramping.  Diarrhea.  Tightness in the chest or chest pain.  Dizziness or fainting.  Redness of the face (flushing).  Hoarse voice.  Swollen tongue, lips, or face.  How is this diagnosed? This condition is usually diagnosed based on your symptoms and medical history as well as a physical exam. You may have an allergy test to determine if you are allergic to the substance that the insect injected during the sting (venom). How is this treated? If you were stung by a bee, the stinger and a small sac of venom may be in the wound. It is important to remove the stinger as soon as possible. You can do this by brushing across the wound with gauze, a fingernail, or a flat card such as a credit card. Removing the stinger can help reduce the severity of your body's reaction to the sting. Most stings can be treated with:  Icing to reduce swelling in the area.  Medicines (antihistamines) to treat itching or an allergic reaction.  Medicines to help reduce pain. These may be medicines that you take by mouth, or medicated creams or lotions that you apply to your skin.  Pay close attention to your symptoms after you have been stung. If possible, have someone stay with you to make sure you do not have an allergic reaction. If you have any signs of an allergic reaction, call your health care provider. If you have ever had a severe allergic reaction, your health care provider may give you an inhaler or injectable medicine (epinephrine auto-injector) to use if necessary. Follow these instructions at home:  Wash the sting site 2-3 times each day with soap and water as told by your health care  provider.  Apply or take over-the-counter and prescription medicines only as told by your health care provider.  If directed, apply ice to the sting area. ? Put ice in a plastic bag. ? Place a towel between your skin and the bag. ? Leave the ice on for 20 minutes, 2-3 times a day.  Do not  scratch the sting area.  If you had a severe allergic reaction to a sting, you may need: ? To wear a medical bracelet or necklace that lists the allergy. ? To learn when and how to use an anaphylaxis kit or epinephrine injection. Your family members and coworkers may also need to learn this. ? To carry an anaphylaxis kit or epinephrine injection with you at all times. How is this prevented?  Avoid swatting at stinging insects and disturbing insect nests.  Do not use fragrant soaps or lotions.  Wear shoes, pants, and long sleeves when spending time outdoors, especially in grassy areas where stinging insects are common.  Keep outdoor areas free from nests or hives.  Keep food and drink containers covered when eating outdoors.  Avoid working or sitting near [employer], if possible.  Wear gloves if you are gardening or working outdoors.  If an attack by a stinging insect or a swarm seems likely in the moment, move away from the area or find a barrier between you and the insect(s), such as a door. Contact a health care provider if:  Your symptoms do not get better in 2-3 days.  You have redness, swelling, or pain that spreads beyond the area of the sting.  You have a fever. Get help right away if: You have symptoms of a severe allergic reaction. These include:  Wheezing or difficulty breathing.  Tightness in the chest or chest pain.  Light-headedness or fainting.  Itchy, raised, red patches on the skin.  Nausea or vomiting.  Abdominal cramping.  Diarrhea.  A swollen tongue or lips, or trouble swallowing.  Dizziness or fainting.  Summary  Stings from bees, wasps, and hornets can cause pain and inflammation, but they are usually not serious. However, some people may have an allergic reaction to a sting. This can cause the symptoms to be more severe.  Pay close attention to your symptoms after you have been stung. If possible, have someone stay with you to make  sure you do not have an allergic reaction.  Call your health care provider if you have any signs of an allergic reaction. This information is not intended to replace advice given to you by your health care provider. Make sure you discuss any questions you have with your health care provider. Document Released: 01/17/2005 Document Revised: 03/24/2016 Document Reviewed: 03/24/2016 Elsevier Interactive Patient Education  2018 [employer].     IF you received an x-ray today, you will receive an invoice from [facility]. Please contact [facility] at [phone] with questions or concerns regarding your invoice.   IF you received labwork today, you will receive an invoice from [facility]. Please contact [facility] at [phone] with questions or concerns regarding your invoice.   Our billing staff will not be able to assist you with questions regarding bills from these companies.  You will be contacted with the lab results as soon as they are available. The fastest way to get your results is to activate your My Chart account. Instructions are located on the last page of this paperwork. If you have not heard from [place] regarding the results in 2 weeks,  please contact this office.       I personally performed the services described in this documentation, which was scribed in my presence. The recorded information has been reviewed and considered for accuracy and completeness, addended by me as needed, and agree with information above.  Signed,   [name], MD Primary Care at [place].  08/22/16 12:34 PM

## 2016-08-23 ENCOUNTER — Telehealth: Payer: Self-pay | Admitting: Family Medicine

## 2016-08-23 NOTE — Telephone Encounter (Signed)
Left message to return call 

## 2016-08-23 NOTE — Telephone Encounter (Signed)
PATIENT STATES SHE SAW DR. Carlota Raspberry Monday (08/22/16) FOR A INSECT BITE ON HER (R) ARM. HE PRESCRIBED HER TO HAVE CEPHALEXIN (KEFLEX) 500 MG AND PREDNISONE. SHE SAID THE AREA IS STILL RED BUT LESS SWOLLEN AND IS A LITTLE SMALLER. SHE HAS NOT STARTED ANY OF THE MEDICINE YET. SHOULD SHE STILL GO AHEAD AND TAKE IT? BEST PHONE [phone] (CELL) [place]

## 2016-08-24 NOTE — Telephone Encounter (Signed)
Pt returned message. States right arm swelling has improved and the area is almost gone.  Slight itch noted but tolerating Prednisone. Advised to discard the antibiotics and start if swelling returns.

## 2016-10-12 DIAGNOSIS — Z860100 Personal history of colon polyps, unspecified: Secondary | ICD-10-CM | POA: Insufficient documentation

## 2016-10-31 ENCOUNTER — Encounter: Payer: Self-pay | Admitting: Physician Assistant

## 2016-10-31 ENCOUNTER — Ambulatory Visit (INDEPENDENT_AMBULATORY_CARE_PROVIDER_SITE_OTHER): Payer: BC Managed Care – PPO | Admitting: Physician Assistant

## 2016-10-31 VITALS — HR 88 | Temp 99.3°F | Resp 16 | Ht 64.0 in | Wt 128.4 lb

## 2016-10-31 DIAGNOSIS — B9789 Other viral agents as the cause of diseases classified elsewhere: Secondary | ICD-10-CM | POA: Diagnosis not present

## 2016-10-31 DIAGNOSIS — J029 Acute pharyngitis, unspecified: Secondary | ICD-10-CM

## 2016-10-31 DIAGNOSIS — Z8709 Personal history of other diseases of the respiratory system: Secondary | ICD-10-CM

## 2016-10-31 DIAGNOSIS — J069 Acute upper respiratory infection, unspecified: Secondary | ICD-10-CM | POA: Diagnosis not present

## 2016-10-31 LAB — POCT RAPID STREP A (OFFICE): Rapid Strep A Screen: NEGATIVE

## 2016-10-31 MED ORDER — BECLOMETHASONE DIPROPIONATE 40 MCG/ACT IN AERS: 2.0000 | INHALATION_SPRAY | Freq: Two times a day (BID) | RESPIRATORY_TRACT | 3 refills | Status: DC

## 2016-10-31 MED ORDER — ALBUTEROL SULFATE HFA 108 (90 BASE) MCG/ACT IN AERS: 2.0000 | INHALATION_SPRAY | Freq: Four times a day (QID) | RESPIRATORY_TRACT | 0 refills | Status: DC | PRN

## 2016-10-31 NOTE — Patient Instructions (Addendum)
Fluids, rest, Ibuprofen.      IF you received an x-ray today, you will receive an invoice from [facility]. Please contact [facility] at [phone] with questions or concerns regarding your invoice.   IF you received labwork today, you will receive an invoice from [facility]. Please contact [facility] at [phone] with questions or concerns regarding your invoice.   Our billing staff will not be able to assist you with questions regarding bills from these companies.  You will be contacted with the lab results as soon as they are available. The fastest way to get your results is to activate your My Chart account. Instructions are located on the last page of this paperwork. If you have not heard from [place] regarding the results in 2 weeks, please contact this office.

## 2016-10-31 NOTE — Progress Notes (Signed)
    11/06/2016 12:23 PM   DOB: 18-Jan-1961 / MRN: [id number]  SUBJECTIVE:  Regina Mendez is a 56 y.o. female presenting for cough producetive of yellow mucous that starte about 2 days ago.  Assoicates unmeasrued fever.   Feels that she is getting worse at this point. Never smoker with no report of asthma in the chart however she does tells me she has a history of asthma a is prescribed Qvar. .   She is allergic to codeine and sulfonamide derivatives.   She  has a past medical history of Acne; Breast cyst; and Migraine headache.    She  reports that she has never smoked. She has never used smokeless tobacco. She reports that she drinks about 0.6 oz of alcohol per week . She reports that she does not use drugs. She  reports that she currently engages in sexual activity. She reports using the following method of birth control/protection: Post-menopausal. The patient  has a past surgical history that includes Tonsillectomy; Appendectomy; Breast surgery; Colposcopy; Ovarian cyst surgery; and Gynecologic cryosurgery (age 55).  Her family history includes Breast cancer in her cousin; Heart disease in her father; Hypertension in her father; Thyroid cancer in her mother.  Review of Systems  Constitutional: Negative for fever.  HENT: Positive for congestion, sinus pain and sore throat.   Respiratory: Positive for cough. Negative for hemoptysis and shortness of breath.   Gastrointestinal: Negative for nausea.  Skin: Negative for rash.    The problem list and medications were reviewed and updated by myself where necessary and exist elsewhere in the encounter.   OBJECTIVE:  Pulse 88   Temp 99.3 F (37.4 C) (Oral)   Resp 16   Ht 5\' 4"  (1.626 m)   Wt 128 lb 6.4 oz (58.2 kg)   LMP 08/13/2012   SpO2 98%   BMI 22.04 kg/m   Physical Exam  Constitutional: She is active.  Non-toxic appearance.  Cardiovascular: Normal rate.   Pulmonary/Chest: Effort normal. No stridor. No tachypnea. No respiratory  distress. She has no wheezes. She has no rales.  Neurological: She is alert.  Skin: Skin is warm and dry. She is not diaphoretic. No pallor.    No results found for this or any previous visit (from the past 72 hour(s)).  No results found.  ASSESSMENT AND PLAN:  Vanesha was seen today for cough.  Diagnoses and all orders for this visit:  Viral URI with cough Comments: Exam normal.  I am mostly concerned about her history of asthma.  Will ensure her meds are current in the event of an impending exacerbation.   History of asthma -     beclomethasone (QVAR) 40 MCG/ACT inhaler; Inhale 2 puffs into the lungs 2 (two) times daily. -     albuterol (PROVENTIL HFA;VENTOLIN HFA) 108 (90 Base) MCG/ACT inhaler; Inhale 2 puffs into the lungs every 6 (six) hours as needed.  Sore throat -     Culture, Group A Strep -     POCT rapid strep A    The patient is advised to call or return to clinic if she does not see an improvement in symptoms, or to seek the care of the closest emergency department if she worsens with the above plan.   Philis Fendt, MHS, PA-C Primary Care at [facility] 11/06/2016 12:23 PM

## 2016-11-02 LAB — CULTURE, GROUP A STREP: Strep A Culture: NEGATIVE

## 2017-02-12 ENCOUNTER — Telehealth: Payer: Self-pay | Admitting: Family Medicine

## 2017-02-12 NOTE — Telephone Encounter (Signed)
Received call from RN line. Patient stated that her children were diagnosed with strep and that she was starting to show signs including sore throat and fevers. Requested that antibiotics be called in for strep throat. Advised nurse that I would be unable to do this and that the patient would need an office evaluation before being prescribed anything.   Algis Greenhouse. Jerline Pain, MD 02/12/2017 1:06 PM

## 2017-02-13 ENCOUNTER — Other Ambulatory Visit: Payer: Self-pay

## 2017-02-13 ENCOUNTER — Ambulatory Visit: Payer: BC Managed Care – PPO | Admitting: Psychology

## 2017-02-13 ENCOUNTER — Ambulatory Visit: Payer: BC Managed Care – PPO | Admitting: Family Medicine

## 2017-02-13 ENCOUNTER — Encounter: Payer: Self-pay | Admitting: Family Medicine

## 2017-02-13 VITALS — BP 108/66 | HR 83 | Temp 98.3°F | Resp 16 | Ht 64.0 in | Wt 130.4 lb

## 2017-02-13 DIAGNOSIS — F411 Generalized anxiety disorder: Secondary | ICD-10-CM | POA: Diagnosis not present

## 2017-02-13 DIAGNOSIS — J029 Acute pharyngitis, unspecified: Secondary | ICD-10-CM

## 2017-02-13 LAB — POCT RAPID STREP A (OFFICE): RAPID STREP A SCREEN: NEGATIVE

## 2017-02-13 MED ORDER — PENICILLIN V POTASSIUM 500 MG PO TABS: 500.0000 mg | ORAL_TABLET | Freq: Four times a day (QID) | ORAL | 0 refills | Status: DC

## 2017-02-13 NOTE — Patient Instructions (Addendum)
     IF you received an x-ray today, you will receive an invoice from [facility]. Please contact  Radiology at [phone] with questions or concerns regarding your invoice.   IF you received labwork today, you will receive an invoice from [facility]. Please contact [facility] at [phone] with questions or concerns regarding your invoice.   Our billing staff will not be able to assist you with questions regarding bills from these companies.  You will be contacted with the lab results as soon as they are available. The fastest way to get your results is to activate your My Chart account. Instructions are located on the last page of this paperwork. If you have not heard from us regarding the results in 2 weeks, please contact this office.     Sore Throat When you have a sore throat, your throat may:  Hurt.  Burn.  Feel irritated.  Feel scratchy.  Many things can cause a sore throat, including:  An infection.  Allergies.  Dryness in the air.  Smoke or pollution.  Gastroesophageal reflux disease (GERD).  A tumor.  A sore throat can be the first sign of another sickness. It can happen with other problems, like coughing or a fever. Most sore throats go away without treatment. Follow these instructions at home:  Take over-the-counter medicines only as told by your doctor.  Drink enough fluids to keep your pee (urine) clear or pale yellow.  Rest when you feel you need to.  To help with pain, try: ? Sipping warm liquids, such as broth, herbal tea, or warm water. ? Eating or drinking cold or frozen liquids, such as frozen ice pops. ? Gargling with a salt-water mixture 3-4 times a day or as needed. To make a salt-water mixture, add -1 tsp of salt in 1 cup of warm water. Mix it until you cannot see the salt anymore. ? Sucking on hard candy or throat lozenges. ? Putting a cool-mist humidifier in your bedroom at night. ? Sitting in the bathroom with the  door closed for 5-10 minutes while you run hot water in the shower.  Do not use any tobacco products, such as cigarettes, chewing tobacco, and e-cigarettes. If you need help quitting, ask your doctor. Contact a doctor if:  You have a fever for more than 2-3 days.  You keep having symptoms for more than 2-3 days.  Your throat does not get better in 7 days.  You have a fever and your symptoms suddenly get worse. Get help right away if:  You have trouble breathing.  You cannot swallow fluids, soft foods, or your saliva.  You have swelling in your throat or neck that gets worse.  You keep feeling like you are going to throw up (vomit).  You keep throwing up. This information is not intended to replace advice given to you by your health care provider. Make sure you discuss any questions you have with your health care provider. Document Released: 10/27/2007 Document Revised: 09/13/2015 Document Reviewed: 11/07/2014 Elsevier Interactive Patient Education  2018 Elsevier Inc.  

## 2017-02-13 NOTE — Progress Notes (Signed)
Chief Complaint  Patient presents with  . Sore Throat    onset: Saturday, fever, red rash on legs, torso, chills, ha.  Twin daughters dx with strep    HPI   Pt reports that she has a red rash on the legs that was itchy Her daughter has strep and is on antibitoic She reports that she developed a fever as well with chills  She has been sleepy for the past 24 hours She reports that she had fever  Chills Sore throat No cough Skin rash on her legs and arms Swollen glands  4 review of systems  Past Medical History:  Diagnosis Date  . Acne   . Breast cyst   . Migraine headache     Current Outpatient Medications  Medication Sig Dispense Refill  . albuterol (PROVENTIL HFA;VENTOLIN HFA) 108 (90 Base) MCG/ACT inhaler Inhale 2 puffs into the lungs every 6 (six) hours as needed. 18 g 0  . beclomethasone (QVAR) 40 MCG/ACT inhaler Inhale 2 puffs into the lungs 2 (two) times daily. 1 Inhaler 3  . BIOTIN PO Take by mouth.    . Multiple Vitamins-Minerals (HAIR/SKIN/NAILS PO) Take by mouth.    . clindamycin (CLEOCIN-T) 1 % external solution Apply topically 2 (two) times daily. 30 mL 5  . meloxicam (MOBIC) 7.5 MG tablet Take 1 tablet (7.5 mg total) by mouth daily. 30 tablet 6  . tretinoin (RETIN-A) 0.05 % cream Apply topically at bedtime. 45 g 5   No current facility-administered medications for this visit.     Allergies:  Allergies  Allergen Reactions  . Codeine Other (See Comments)    Drowsy  . Sulfonamide Derivatives Dermatitis    Past Surgical History:  Procedure Laterality Date  . APPENDECTOMY    . BREAST SURGERY     right breast bx  . COLPOSCOPY    . GYNECOLOGIC CRYOSURGERY  age 48  . OVARIAN CYST SURGERY    . TONSILLECTOMY      Social History   Socioeconomic History  . Marital status: Married    Spouse name: None  . Number of children: None  . Years of education: None  . Highest education level: None  Social Needs  . Financial resource strain: None  . Food  insecurity - worry: None  . Food insecurity - inability: None  . Transportation needs - medical: None  . Transportation needs - non-medical: None  Occupational History  . None  Tobacco Use  . Smoking status: Never Smoker  . Smokeless tobacco: Never Used  Substance and Sexual Activity  . Alcohol use: Yes    Alcohol/week: 0.6 oz    Types: 1 Standard drinks or equivalent per week  . Drug use: No  . Sexual activity: Yes    Birth control/protection: Post-menopausal    Comment: 1st intercourse 57 yo-More than 5 partners  Other Topics Concern  . None  Social History Narrative  . None    Family History  Problem Relation Age of Onset  . Thyroid cancer Mother   . Hypertension Father   . Heart disease Father   . Breast cancer Cousin        Mat. 1st cousins X 2     Age 65's     ROS Review of Systems See HPI Constitution: No fevers or chills No malaise No diaphoresis Skin: No rash or itching Eyes: no blurry vision, no double vision GU: no dysuria or hematuria Neuro: no dizziness or headaches all others reviewed and negative  Objective: Vitals:   02/13/17 1157  BP: 108/66  Pulse: 83  Resp: 16  Temp: 98.3 F (36.8 C)  TempSrc: Oral  SpO2: 98%  Weight: 130 lb 6.4 oz (59.1 kg)  Height: 5\' 4"  (1.626 m)    Physical Exam General: alert, oriented, in NAD Head: normocephalic, atraumatic, no sinus tenderness Eyes: EOM intact, no scleral icterus or conjunctival injection Ears: TM clear bilaterally Nose: mucosa nonerythematous, nonedematous Throat: no pharyngeal exudate, +erythema Lymph: no posterior auricular, submental or cervical lymph adenopathy Heart: normal rate, normal sinus rhythm, no murmurs Lungs: clear to auscultation bilaterally, no wheezing   Assessment and Plan Drena was seen today for sore throat.  Diagnoses and all orders for this visit:  Sore throat -     POCT rapid strep A  Other orders -     Cancel: Flu Vaccine QUAD 36+ mos IM -      Cancel: Ambulatory referral to Gastroenterology -     Discontinue: penicillin v potassium (VEETID) 500 MG tablet; Take 1 tablet (500 mg total) by mouth 4 (four) times daily for 10 days.  negative for strep Advised supportive care   [place]

## 2017-02-18 ENCOUNTER — Telehealth: Payer: Self-pay | Admitting: Surgical

## 2017-02-18 ENCOUNTER — Ambulatory Visit: Payer: BC Managed Care – PPO | Admitting: Nurse Practitioner

## 2017-02-18 ENCOUNTER — Encounter: Payer: Self-pay | Admitting: Nurse Practitioner

## 2017-02-18 VITALS — BP 98/60 | HR 89 | Temp 98.3°F | Resp 14 | Ht 64.0 in | Wt 128.0 lb

## 2017-02-18 DIAGNOSIS — L299 Pruritus, unspecified: Secondary | ICD-10-CM | POA: Diagnosis not present

## 2017-02-18 NOTE — Patient Instructions (Signed)
Use mosturizing lotion/cream daily. May combine with hydrocortisone ointment OTC (do not use on face). May also use benadryl 25mg  OTC as directed on package.  I do not think itching is as result of oral abx use. Complete oral abx as prescribed.

## 2017-02-18 NOTE — Telephone Encounter (Signed)
Spoke with triage nurse about patient. Patient called triage nurse and stated that she was seen Monday. She was tested for strep and it was negative. She was told to start the antibiotic if symptoms got worse. She started yesterday. Triage nurse said that patient had rash on Monday as well. Since starting antibiotic rash has gotten worse and more defined. Spoke with Dr. Anitra Lauth about this and advised patient that she should be seen. Triage nurse will relay message.

## 2017-02-18 NOTE — Progress Notes (Signed)
Subjective:  Patient ID: Regina Mendez, female    DOB: [birth year]  Age: 57 y.o. MRN: [id number]  CC: Pruritis (x 1 week. Itching all over, redness) and Sore Throat (daughters dx with strep. Seen on 02/13/17 given rx for abx. Started 1 day ago)   Rash  This is a new problem. The current episode started in the past 7 days. The problem has been gradually improving since onset. The rash is diffuse. The rash is characterized by itchiness and redness. She was exposed to nothing. Associated symptoms include a sore throat. Pertinent negatives include no congestion, cough, facial edema, fatigue, fever, joint pain, rhinorrhea or shortness of breath. Past treatments include antihistamine. The treatment provided significant relief.  start oral abx for strep yesterday. Itching started 1week ago. Reports she was told, it was related to strep infection.  Outpatient Medications Prior to Visit  Medication Sig Dispense Refill  . albuterol (PROVENTIL HFA;VENTOLIN HFA) 108 (90 Base) MCG/ACT inhaler Inhale 2 puffs into the lungs every 6 (six) hours as needed. 18 g 0  . beclomethasone (QVAR) 40 MCG/ACT inhaler Inhale 2 puffs into the lungs 2 (two) times daily. 1 Inhaler 3  . BIOTIN PO Take by mouth.    . clindamycin (CLEOCIN-T) 1 % external solution Apply topically 2 (two) times daily. 30 mL 5  . meloxicam (MOBIC) 7.5 MG tablet Take 1 tablet (7.5 mg total) by mouth daily. 30 tablet 0  . Multiple Vitamins-Minerals (HAIR/SKIN/NAILS PO) Take by mouth.    . penicillin v potassium (VEETID) 500 MG tablet Take 1 tablet (500 mg total) by mouth 4 (four) times daily for 10 days. 40 tablet 0  . tretinoin (RETIN-A) 0.05 % cream Apply topically at bedtime. 45 g 5  . aspirin 81 MG tablet Take 81 mg by mouth daily.      . cephALEXin (KEFLEX) 500 MG capsule Take 1 capsule (500 mg total) by mouth 2 (two) times daily. (Patient not taking: Reported on 02/13/2017) 20 capsule 0   No facility-administered medications prior to visit.      ROS See HPI  Objective:  BP 98/60   Pulse 89   Temp 98.3 F (36.8 C) (Oral)   Resp 14   Ht 5' 4"  (1.626 m)   Wt 128 lb (58.1 kg)   LMP 08/13/2012   SpO2 96%   BMI 21.97 kg/m   BP Readings from Last 3 Encounters:  02/18/17 98/60  02/13/17 108/66  08/22/16 113/71    Wt Readings from Last 3 Encounters:  02/18/17 128 lb (58.1 kg)  02/13/17 130 lb 6.4 oz (59.1 kg)  10/31/16 128 lb 6.4 oz (58.2 kg)    Physical Exam  Constitutional: She is oriented to person, place, and time.  HENT:  Mouth/Throat: Uvula is midline. No trismus in the jaw. Posterior oropharyngeal erythema present. No oropharyngeal exudate.  Neck: Neck supple.  Cardiovascular: Normal rate.  Pulmonary/Chest: Effort normal.  Lymphadenopathy:    She has no cervical adenopathy.  Neurological: She is alert and oriented to person, place, and time.  Skin: Skin is warm and dry. No rash noted. No erythema.  Vitals reviewed.   Lab Results  Component Value Date   WBC 6.5 02/09/2016   HGB 12.9 02/09/2016   HCT 37.9 02/09/2016   PLT 297.0 02/09/2016   GLUCOSE 84 02/09/2016   CHOL 244 (H) 02/09/2016   TRIG 118.0 02/09/2016   HDL 50.20 02/09/2016   LDLDIRECT 113.6 10/06/2011   LDLCALC 171 (H) 02/09/2016   ALT 11  02/09/2016   AST 16 02/09/2016   NA 137 02/09/2016   K 3.9 02/09/2016   CL 102 02/09/2016   CREATININE 0.81 02/09/2016   BUN 10 02/09/2016   CO2 29 02/09/2016   TSH 1.10 02/09/2016    Mm Clip Placement Right  Addendum Date: 01/08/2016   ADDENDUM REPORT: 01/08/2016 10:15 ADDENDUM: The patient underwent stereotactic core needle biopsy of the architectural distortion in the outer right breast after the ultrasound-guided core needle biopsy. An X shaped tissue marker clip was placed after the stereotactic biopsy and is appropriately positioned within the superolateral aspect of the architectural distortion. Expected post biopsy changes are present without evidence of hematoma. IMPRESSION:  Appropriate positioning of the X shaped tissue marker clip within the superolateral aspect of the architectural distortion in the outer right breast after stereotactic core needle biopsy. Final Assessment:  Post Procedure Mammograms For Marker Placement. Electronically Signed   By: Evangeline Dakin M.D.   On: 01/08/2016 10:15   Result Date: 01/08/2016 CLINICAL DATA:  Confirmation of clip placement after ultrasound-guided core needle biopsy of an indeterminate shadowing focus in the lower outer right breast at the 6:30 o'clock position approximately 2 cm from the nipple. The patient had screening detected architectural distortion in the outer right breast on screening tomosynthesis mammography and the indeterminate shadowing focus was identified on diagnostic ultrasound. EXAM: DIAGNOSTIC RIGHT MAMMOGRAM POST ULTRASOUND BIOPSY COMPARISON:  Previous exam(s). FINDINGS: Mammographic images were obtained following ultrasound guided biopsy of an indeterminate 1.0 cm shadowing focus in the lower outer right breast at the 6:30 o'clock position approximately 2 cm from the nipple. The coil shaped tissue marker clip is appropriately positioned at the site of the biopsy in the lower outer breast at anterior depth. The biopsied shadowing focus does not correlate with the screening detected architectural distortion which is lateral and posterior to the coil shaped clip. Expected post biopsy changes are present without evidence of hematoma. IMPRESSION: 1. Appropriate positioning of the coil shaped tissue marker clip at the site of the biopsy in the lower outer right breast at anterior depth. 2. The biopsied indeterminate shadowing focus does not correspond to the tomosynthesis screening detected architectural distortion. Therefore, the patient will have tomosynthesis stereotactic guided biopsy of the architectural distortion subsequently and this will be reported separately. Final Assessment: Post Procedure Mammograms for Marker  Placement Electronically Signed: By: Evangeline Dakin M.D. 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 This was found to be concordant by Dr. Evangeline Dakin. The outer right breast showed fibrocystic changes with usual ductal hyperplasia and pseudoangiomatous stromal hyperplasia. This was found to be discordant by Dr. Peggye Fothergill. Pathology results were discussed with the patient by telephone. The patient reported doing well after the biopsies . Post biopsy instructions and care were reviewed and questions were answered. The patient was encouraged to call [facility] for any additional concerns. Surgical consultation has been arranged with Dr. Donnie Mesa at [facility] on January 19, 2016. Pathology results reported by Susa Raring RN, BSN on 01/11/2016. Electronically Signed   By: Evangeline Dakin M.D.   On: 01/11/2016 11:16   Result Date: 01/08/2016 CLINICAL DATA:  57 year old with architectural distortion in the outer right breast on recent screening tomosynthesis mammography. Diagnostic workup  demonstrated a 1.0 cm shadowing focus at the 6:30 o'clock position approximately 2 cm from the nipple which was biopsied earlier today and does not correlate with the distortion on mammography. Therefore, the architectural distortion his biopsied under tomosynthesis stereotactic guidance. Patient states that she has sensitivity to lidocaine. Therefore, Nesacaine, a local anesthetic that is an ester rather than an amide, is used today. EXAM: RIGHT BREAST STEREOTACTIC CORE NEEDLE BIOPSY COMPARISON:  Previous exams. FINDINGS: The patient and I discussed the procedure of stereotactic-guided biopsy including benefits and alternatives. We  discussed the high likelihood of a successful procedure. We discussed the risks of the procedure including infection, bleeding, tissue injury, clip migration, and inadequate sampling. Informed written consent was given. The usual time out protocol was performed immediately prior to the procedure. Using sterile technique with chlorhexidine as skin antisepsis, 2% Nesacaine as local anesthetic, under stereotactic guidance, a 9 gauge vacuum assisted device was used to perform core needle biopsy of the architectural distortion in the outer right breast using a superior approach. Specimen radiograph was not performed as there are no calcifications in the lesion. At the conclusion of the procedure, an X shaped tissue marker clip was deployed into the biopsy cavity. Follow-up 2-view mammogram was performed and dictated separately. IMPRESSION: Stereotactic-guided biopsy of architectural distortion in the outer right breast. No apparent complications. The patient had no reaction to the 2% Nesacaine local anesthetic. Electronically Signed: By: Evangeline Dakin M.D. 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 This was found to be concordant by Dr. Evangeline Dakin. The outer right breast showed fibrocystic changes with usual ductal hyperplasia and pseudoangiomatous stromal hyperplasia. This was found to be discordant by Dr. Peggye Fothergill. Pathology results were discussed with the patient by telephone. The patient reported doing well after the biopsies . Post biopsy instructions and care were reviewed and questions were answered. The patient was encouraged to call [facility] for any additional concerns. Surgical consultation has been arranged with Dr. Donnie Mesa at [facility] on January 19, 2016. Pathology results reported by Susa Raring RN, BSN on 01/11/2016. Electronically Signed   By: Evangeline Dakin M.D.   On: 01/11/2016 11:17   Result Date: 01/08/2016 CLINICAL DATA:  57 year old with architectural distortion in the outer right breast on recent screening mammography, diagnostic workup demonstrated a 1.0 cm shadowing focus at the 6:30 o'clock position approximately 2 cm from the nipple which may or may not correlate with the distortion on mammography. Biopsy of the shadowing focus is performed. The patient has a personal history of a remote benign excisional biopsy from the lower right breast. Patient states that she has sensitivity to lidocaine. Therefore, Nesacaine, a local anesthetic that is an ester rather than an amide, is used today. EXAM: ULTRASOUND GUIDED RIGHT BREAST CORE NEEDLE BIOPSY COMPARISON:  Previous exam(s). FINDINGS: I met with the patient and we discussed the procedure of ultrasound-guided biopsy, including benefits and alternatives. We discussed the high likelihood of a successful procedure. We discussed the risks of the procedure, including infection, bleeding, tissue injury, clip migration, and inadequate sampling. Informed written consent was given. The usual time-out protocol was performed immediately prior to the procedure. Using sterile technique with chlorhexidine as skin antisepsis, 2% Nesacaine as local anesthetic, under  direct ultrasound visualization, a 12 gauge Bard Marquee core needle device was used to perform biopsy of the shadowing focus in the lower outer right breast using a lateral approach. At the conclusion of the procedure a coil shaped tissue marker clip was deployed into the biopsy cavity. Follow up 2 view mammogram was performed and dictated separately. IMPRESSION: Ultrasound guided biopsy of an indeterminate shadowing focus in the lower outer right breast. No apparent complications. Please note that  Nesacaine was used instead of Lidocaine for local anesthesia due to her prior Lidocaine sensitivity. She had no reaction to the Nesacaine. Electronically Signed: By: Evangeline Dakin M.D. On: 01/08/2016 08:52    Assessment & Plan:   Jaelani was seen today for pruritis and sore throat.  Diagnoses and all orders for this visit:  Itching   I have discontinued Pearley Cumpston's aspirin and cephALEXin. I am also having her maintain her BIOTIN PO, Multiple Vitamins-Minerals (HAIR/SKIN/NAILS PO), meloxicam, clindamycin, tretinoin, beclomethasone, albuterol, and penicillin v potassium.  No orders of the defined types were placed in this encounter.   Follow-up: No Follow-up on file.  Wilfred Lacy, NP

## 2017-02-18 NOTE — Telephone Encounter (Signed)
Agree 

## 2017-02-20 ENCOUNTER — Encounter: Payer: Self-pay | Admitting: Family Medicine

## 2017-02-20 ENCOUNTER — Ambulatory Visit (INDEPENDENT_AMBULATORY_CARE_PROVIDER_SITE_OTHER): Payer: BC Managed Care – PPO | Admitting: Family Medicine

## 2017-02-20 VITALS — BP 100/64 | HR 78 | Temp 97.6°F | Ht 64.0 in | Wt 128.0 lb

## 2017-02-20 DIAGNOSIS — Z86010 Personal history of colonic polyps: Secondary | ICD-10-CM

## 2017-02-20 DIAGNOSIS — J309 Allergic rhinitis, unspecified: Secondary | ICD-10-CM

## 2017-02-20 DIAGNOSIS — Z23 Encounter for immunization: Secondary | ICD-10-CM

## 2017-02-20 DIAGNOSIS — Z0001 Encounter for general adult medical examination with abnormal findings: Secondary | ICD-10-CM

## 2017-02-20 DIAGNOSIS — Z0000 Encounter for general adult medical examination without abnormal findings: Secondary | ICD-10-CM

## 2017-02-20 DIAGNOSIS — N63 Unspecified lump in unspecified breast: Secondary | ICD-10-CM | POA: Diagnosis not present

## 2017-02-20 LAB — BASIC METABOLIC PANEL
BUN: 12 mg/dL (ref 6–23)
CALCIUM: 9.4 mg/dL (ref 8.4–10.5)
CO2: 32 mEq/L (ref 19–32)
Chloride: 102 mEq/L (ref 96–112)
Creatinine, Ser: 0.75 mg/dL (ref 0.40–1.20)
GFR: 85 mL/min (ref >60.00)
GLUCOSE: 94 mg/dL (ref 70–99)
Potassium: 4.2 mEq/L (ref 3.5–5.1)
SODIUM: 138 meq/L (ref 135–145)

## 2017-02-20 LAB — CBC WITH DIFFERENTIAL/PLATELET
BASOS PCT: 0.7 % (ref 0.0–3.0)
Basophils Absolute: 0 10*3/uL (ref 0.0–0.1)
EOS PCT: 3.1 % (ref 0.0–5.0)
Eosinophils Absolute: 0.2 10*3/uL (ref 0.0–0.7)
HCT: 41 % (ref 36.0–46.0)
HEMOGLOBIN: 13.7 g/dL (ref 12.0–15.0)
Lymphocytes Relative: 28 % (ref 12.0–46.0)
Lymphs Abs: 1.6 10*3/uL (ref 0.7–4.0)
MCHC: 33.5 g/dL (ref 30.0–36.0)
MCV: 94.4 fl (ref 78.0–100.0)
MONOS PCT: 11.2 % (ref 3.0–12.0)
Monocytes Absolute: 0.6 10*3/uL (ref 0.1–1.0)
Neutro Abs: 3.2 10*3/uL (ref 1.4–7.7)
Neutrophils Relative %: 57 % (ref 43.0–77.0)
Platelets: 285 10*3/uL (ref 150.0–400.0)
RBC: 4.35 Mil/uL (ref 3.87–5.11)
RDW: 13.5 % (ref 11.5–15.5)
WBC: 5.7 10*3/uL (ref 4.0–10.5)

## 2017-02-20 LAB — POCT URINALYSIS DIPSTICK
BILIRUBIN UA: NEGATIVE
GLUCOSE UA: NEGATIVE
Ketones, UA: NEGATIVE
LEUKOCYTES UA: NEGATIVE
Nitrite, UA: NEGATIVE
Protein, UA: NEGATIVE
Spec Grav, UA: 1.015 (ref 1.010–1.025)
Urobilinogen, UA: 0.2 E.U./dL
pH, UA: 6.5 (ref 5.0–8.0)

## 2017-02-20 LAB — HEPATIC FUNCTION PANEL
ALBUMIN: 3.8 g/dL (ref 3.5–5.2)
ALT: 9 U/L (ref 0–35)
AST: 16 U/L (ref 0–37)
Alkaline Phosphatase: 51 U/L (ref 39–117)
Bilirubin, Direct: 0.1 mg/dL (ref 0.0–0.3)
TOTAL PROTEIN: 6.7 g/dL (ref 6.0–8.3)
Total Bilirubin: 0.6 mg/dL (ref 0.2–1.2)

## 2017-02-20 LAB — LIPID PANEL
CHOLESTEROL: 214 mg/dL — AB (ref 0–200)
HDL: 41.8 mg/dL (ref >39.00)
LDL Cholesterol: 146 mg/dL — ABNORMAL HIGH (ref 0–99)
NonHDL: 171.83
Total CHOL/HDL Ratio: 5
Triglycerides: 129 mg/dL (ref 0.0–149.0)
VLDL: 25.8 mg/dL (ref 0.0–40.0)

## 2017-02-20 LAB — TSH: TSH: 1.43 u[IU]/mL (ref 0.35–4.50)

## 2017-02-20 MED ORDER — TRETINOIN 0.05 % EX CREA: TOPICAL_CREAM | Freq: Every day | CUTANEOUS | 5 refills | Status: DC

## 2017-02-20 MED ORDER — MELOXICAM 7.5 MG PO TABS: 7.5000 mg | ORAL_TABLET | Freq: Every day | ORAL | 6 refills | Status: DC

## 2017-02-20 MED ORDER — CLINDAMYCIN PHOSPHATE 1 % EX SOLN: Freq: Two times a day (BID) | CUTANEOUS | 5 refills | Status: DC

## 2017-02-20 NOTE — Patient Instructions (Signed)
Exercise 30 minutes daily  Continue her when necessary medicines as we discussed  Labs today.......... I will call you if there is anything abnormal  Remember to do a thorough BSE monthly and follow-up as outlined by the breast clinic  Return in one year for general physical exam sooner if any problems  Stop the antibiotics

## 2017-02-20 NOTE — Progress Notes (Signed)
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 She's currently having a lot of head congestion. Recommend OTCs Zyrtec and steroid nasal spray  She has a history of asthma when she gets a bad viral infection. She takes Qvar and albuterol but only when necessary. This occurs once or twice a year.  She takes Motrin when necessary for osteoarthritis  Uses Cleocin T and Retin-A for her scan  Her GYN has taken off all of her hormonal supplements. She had a biopsy of her right breast couple months ago at [facility]. Was dysplastic. She is told to 30% chance 7 breast cancer. She scheduled for yearly MRI.  She she recently went to an urgent care because of that episode of fever chills or skin rash. Exam was negative the children had strep. They did do a strep test but gave her course of antibiotics. She then went to the Saturday clinic. The Keflex was discontinued but she was given penicillin. The rash has resolved spontaneously.  She gets routine eye care, dental care, BSE monthly, annual mammography, colonoscopy due. Recommend she see her GI person here in [facility] Dr. Collene Mares.  Social history............ 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 The twins had a lot of health problems. They're currently doing well. Ones in early college  14 point review of systems reviewed and otherwise negative  BP 100/64 (BP Location: Left Arm, Patient Position: Sitting, Cuff Size: Normal)   Pulse 78   Temp 97.6 F (36.4 C) (Oral)   Ht 5\' 4"  (1.626 m)   Wt 128 lb (58.1 kg)   LMP 08/13/2012   BMI 21.97 kg/m  Examination of the HEENT were negative neck was supple thyroid not enlarged no carotid bruits cardiopulmonary exam normal breast exam right breast shows scar 12:00 position on the right from previous lumpectomy. She has multiple small fibrocystic changes on the right breast. Left breast  there is a marble sized lesion 6:00 an inch below the nipple. This lesion has been present before. It soft rubbery and movable and unchanged from previous exams. She states she is having less breast soreness since she discontinued the estrogen.  Abdominal exam negative pelvic and rectal by GYN therefore deferred extremities normal skin normal peripheral pulses normal  #1 healthy female  #2 allergic rhinitis...... OTC Zyrtec plain and steroid nasal spray daily at bedtime  #3 episodic asthma related to viral infections.......Marland Kitchen begin Qvar whenever she gets a bad cold  #4 skin rash etiology unknown........... stop the antibiotics  #5 fibrocystic breast changes with recent biopsy right breast showing dysplasia.......... follow-up per breast clinic

## 2017-03-17 ENCOUNTER — Encounter: Payer: Self-pay | Admitting: Family Medicine

## 2017-03-17 ENCOUNTER — Ambulatory Visit: Payer: BC Managed Care – PPO | Admitting: Family Medicine

## 2017-03-17 VITALS — BP 100/60 | HR 65 | Temp 98.7°F | Wt 129.4 lb

## 2017-03-17 DIAGNOSIS — R002 Palpitations: Secondary | ICD-10-CM | POA: Diagnosis not present

## 2017-03-17 NOTE — Patient Instructions (Signed)
Palpitations A palpitation is the feeling that your heartbeat is irregular or is faster than normal. It may feel like your heart is fluttering or skipping a beat. Palpitations are usually not a serious problem. They may be caused by many things, including smoking, caffeine, alcohol, stress, and certain medicines. Although most causes of palpitations are not serious, palpitations can be a sign of a serious medical problem. In some cases, you may need further medical evaluation. Follow these instructions at home: Pay attention to any changes in your symptoms. Take these actions to help with your condition:  Avoid the following: ? Caffeinated coffee, tea, soft drinks, diet pills, and energy drinks. ? Chocolate. ? Alcohol.  Do not use any tobacco products, such as cigarettes, chewing tobacco, and e-cigarettes. If you need help quitting, ask your health care provider.  Try to reduce your stress and anxiety. Things that can help you relax include: ? Yoga. ? Meditation. ? Physical activity, such as swimming, jogging, or walking. ? Biofeedback. This is a method that helps you learn to use your mind to control things in your body, such as your heartbeats.  Get plenty of rest and sleep.  Take over-the-counter and prescription medicines only as told by your health care provider.  Keep all follow-up visits as told by your health care provider. This is important.  Contact a health care provider if:  You continue to have a fast or irregular heartbeat after 24 hours.  Your palpitations occur more often. Get help right away if:  You have chest pain or shortness of breath.  You have a severe headache.  You feel dizzy or you faint. This information is not intended to replace advice given to you by your health care provider. Make sure you discuss any questions you have with your health care provider. Document Released: 01/15/2000 Document Revised: 06/22/2015 Document Reviewed: 10/02/2014 Elsevier  Interactive Patient Education  2018 Elsevier Inc.  

## 2017-03-17 NOTE — Progress Notes (Signed)
Subjective:     Patient ID: Regina Mendez, female   DOB: January 31, 1961, 57 y.o.   MRN: [id number]  HPI Patient seen with 2 day history of intermittent transient "flutter sensation" in her chest. She described this as somewhat of a "vibration" sensation. No real pain. She exercises regularly and has had no difficulties with that. No dyspnea with exertion. Recent TSH normal. She usually drinks 2 shots of espresso each morning. Only occasional wine. She has had similar symptoms in the past. Recent TSH normal.  Father had some heart problems but no premature CAD hx.    Past Medical History:  Diagnosis Date  . Acne   . Breast cyst   . Migraine headache    Past Surgical History:  Procedure Laterality Date  . APPENDECTOMY    . BREAST SURGERY     right breast bx  . COLPOSCOPY    . GYNECOLOGIC CRYOSURGERY  age 63  . OVARIAN CYST SURGERY    . TONSILLECTOMY      reports that  has never smoked. she has never used smokeless tobacco. She reports that she drinks about 0.6 oz of alcohol per week. She reports that she does not use drugs. family history includes Breast cancer in her cousin; Heart disease in her father; Hypertension in her father; Thyroid cancer in her mother. Allergies  Allergen Reactions  . Codeine Other (See Comments)    Drowsy  . Sulfonamide Derivatives Dermatitis     Review of Systems  Constitutional: Negative for fatigue.  Eyes: Negative for visual disturbance.  Respiratory: Negative for cough, chest tightness, shortness of breath and wheezing.   Cardiovascular: Positive for palpitations. Negative for chest pain and leg swelling.  Gastrointestinal: Negative for abdominal pain, nausea and vomiting.  Neurological: Negative for dizziness, seizures, syncope, weakness, light-headedness and headaches.       Objective:   Physical Exam  Constitutional: She appears well-developed and well-nourished.  Neck: Neck supple. No thyromegaly present.  Cardiovascular: Normal rate and  regular rhythm. Exam reveals no gallop.  No murmur heard. Pulmonary/Chest: Effort normal and breath sounds normal. No respiratory distress. She has no wheezes. She has no rales.  Musculoskeletal: She exhibits no edema.       Assessment:     Patient presents with 2 day history of occasional fluttering sensation in her chest. No worrisome symptoms or findings on exam. She's not had any syncope, dizziness, chest pains, dyspnea    Plan:     -Check EKG=NSR with no acute changes. -Minimize caffeine intake -Monitor for any tachycardia or heart irregularity -discussed we could look at event monitor for any persistent or worsening symptoms.  Eulas Post MD [facility]

## 2017-03-22 ENCOUNTER — Ambulatory Visit: Payer: BC Managed Care – PPO | Admitting: Psychology

## 2017-04-04 ENCOUNTER — Ambulatory Visit: Payer: BC Managed Care – PPO | Admitting: Psychology

## 2017-04-04 DIAGNOSIS — F411 Generalized anxiety disorder: Secondary | ICD-10-CM | POA: Diagnosis not present

## 2017-05-04 ENCOUNTER — Encounter: Payer: Self-pay | Admitting: Family Medicine

## 2017-05-09 ENCOUNTER — Encounter: Payer: Self-pay | Admitting: Gynecology

## 2017-05-09 ENCOUNTER — Ambulatory Visit: Payer: BC Managed Care – PPO | Admitting: Gynecology

## 2017-05-09 VITALS — BP 114/66 | Ht 66.0 in | Wt 127.0 lb

## 2017-05-09 DIAGNOSIS — N952 Postmenopausal atrophic vaginitis: Secondary | ICD-10-CM | POA: Diagnosis not present

## 2017-05-09 DIAGNOSIS — Z01411 Encounter for gynecological examination (general) (routine) with abnormal findings: Secondary | ICD-10-CM | POA: Diagnosis not present

## 2017-05-09 DIAGNOSIS — N951 Menopausal and female climacteric states: Secondary | ICD-10-CM | POA: Diagnosis not present

## 2017-05-09 DIAGNOSIS — Z1151 Encounter for screening for human papillomavirus (HPV): Secondary | ICD-10-CM

## 2017-05-09 MED ORDER — VENLAFAXINE HCL ER 37.5 MG PO CP24: 37.5000 mg | ORAL_CAPSULE | Freq: Every day | ORAL | 2 refills | Status: DC

## 2017-05-09 NOTE — Progress Notes (Addendum)
Regina Mendez 11-Oct-1960 [id number]        57 y.o.  G2P2002 for annual gynecologic exam.  Patient also complaining of significant menopausal symptoms to include hot flushes and night sweats.  Difficulty sleeping.  Some mild depression and anxiety.  Had stopped HRT last year and this all started afterwards.  Was seen at [facility] with breast biopsy showing hyperplasia with atypia.  She recently had mammography and screening MRI which were negative.  Past medical history,surgical history, problem list, medications, allergies, family history and social history were all reviewed and documented as reviewed in the EPIC chart.  ROS:  Performed with pertinent positives and negatives included in the history, assessment and plan.   Additional significant findings : None   Exam: [name] assistant Vitals:   05/09/17 0920  BP: 114/66  Weight: 127 lb (57.6 kg)  Height: 5\' 6"  (1.676 m)   Body mass index is 20.5 kg/m.  General appearance:  Normal affect, orientation and appearance. Skin: Grossly normal HEENT: Without gross lesions.  No cervical or supraclavicular adenopathy. Thyroid normal.  Lungs:  Clear without wheezing, rales or rhonchi Cardiac: RR, without RMG Abdominal:  Soft, nontender, without masses, guarding, rebound, organomegaly or hernia Breasts:  Examined lying and sitting without masses, retractions, discharge or axillary adenopathy. Pelvic:  Ext, BUS, Vagina: With atrophic changes  Cervix: With atrophic changes.  Pap smear/HPV  Uterus: Retroverted, normal size, shape and contour, midline and mobile nontender   Adnexa: Without masses or tenderness    Anus and perineum: Normal   Rectovaginal: Normal sphincter tone without palpated masses or tenderness.    Assessment/Plan:  57 y.o. G13P2002 female for annual gynecologic exam.   1. Postmenopausal/atrophic genital changes/menopausal symptoms.  Patient having significant hot flushes, night sweats, sleep disturbances some  mild depression and anxiety and just not feeling well overall since stopping her HRT.  Was told that she has a 30% lifetime risk of breast cancer and they are starting with annual MRI and mammography's.  We discussed the issues of HRT with her increased risk of breast cancer based on her biopsy results.  The issue that HRT may also increase her risk of breast cancer discussed.  We both at this point do not want to initiate HRT.  I discussed alternatives with her.  She is trying OTC products but these do not seem to help.  I reviewed prescription alternatives to include Effexor.  I discussed its antidepressant/anxiolytic properties and studies that seemed to show benefit with menopausal symptoms.  She may also glean benefit from her mild depression and anxiety.  Effects and risks reviewed.  Patient will initiate at 37.5 XR daily.  She will call me in 30 days to let me know how she is doing.  #30 with 2 refills provided.  Possible upward dose adjustment discussed. 2. Ovarian cancer surveillance.  Patient asked me about checking for ovarian cancer.  She has a history of ovarian cysts during her life and she was wondering whether we should do an ultrasound now.  She is having no pain or other symptoms in the pelvis.  She has no family history of ovarian cancer.  The issues surrounding surveillance ultrasounds with false positive and false negative issues discussed.  Her exam today is normal.  She understands though that exams have low sensitivity and no guarantee of no pathology.  I offered to schedule an ultrasound for her again excepting the pitfalls for ovarian surveillance.  The patient declines at this point though  she states that she may call to schedule if she changes her mind. 3. Colonoscopy 2019.  Repeat at their recommended interval. 4. Pap smear 2017.  Pap smear/HPV done today.  History of cryosurgery in her 56s with normal Pap smears since. 5. DEXA 2010 reported normal.  Plan repeat DEXA further into the  menopause. 6. Health maintenance.  No routine lab work done as patient does this elsewhere.  Follow-up in a month in response to her Effexor.  Follow-up in 1 year for annual exam.  Additional 15-minutes in excess of her routine gynecologic exam was spent in direct face to face counseling and coordination of care in regards to her menopausal symptoms and treatment provided as well as discussion of ovarian cancer surveillance.       [name] MD, 9:48 AM 05/09/2017

## 2017-05-09 NOTE — Addendum Note (Signed)
Addended by: Nelva Nay on: 05/09/2017 10:04 AM   Modules accepted: Orders

## 2017-05-09 NOTE — Patient Instructions (Signed)
Call me in a month or so to let me know how you are doing on the new medication as far as your menopausal symptoms.  Call if you want to schedule an ultrasound.

## 2017-05-11 LAB — PAP IG AND HPV HIGH-RISK: HPV DNA HIGH RISK: NOT DETECTED

## 2017-05-29 IMAGING — MG 2D DIGITAL DIAGNOSTIC BILATERAL MAMMOGRAM WITH CAD AND ADJUNCT T
8 of 14 series · 8 of 34 positions shown · non-contrast
Comparison: Previous exam(s).

CLINICAL DATA: Two year follow-up of a left breast mass. Known
fibrocystic changes.

EXAM:
2D DIGITAL DIAGNOSTIC BILATERAL MAMMOGRAM WITH CAD AND ADJUNCT TOMO
ULTRASOUND BILATERAL BREAST

[L CC]
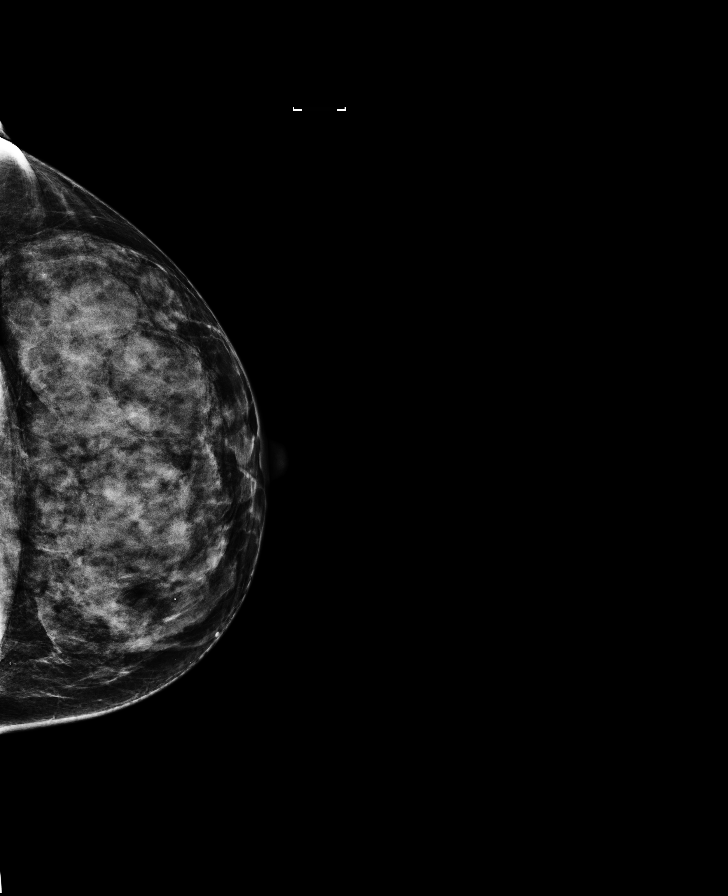

[R CC synth-2D]
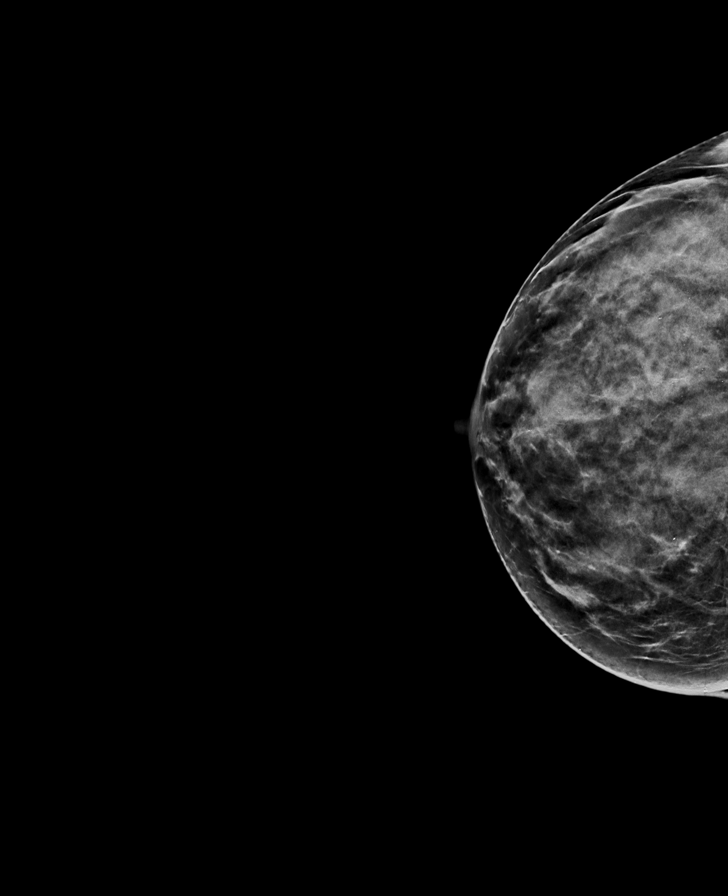

[R MLO]
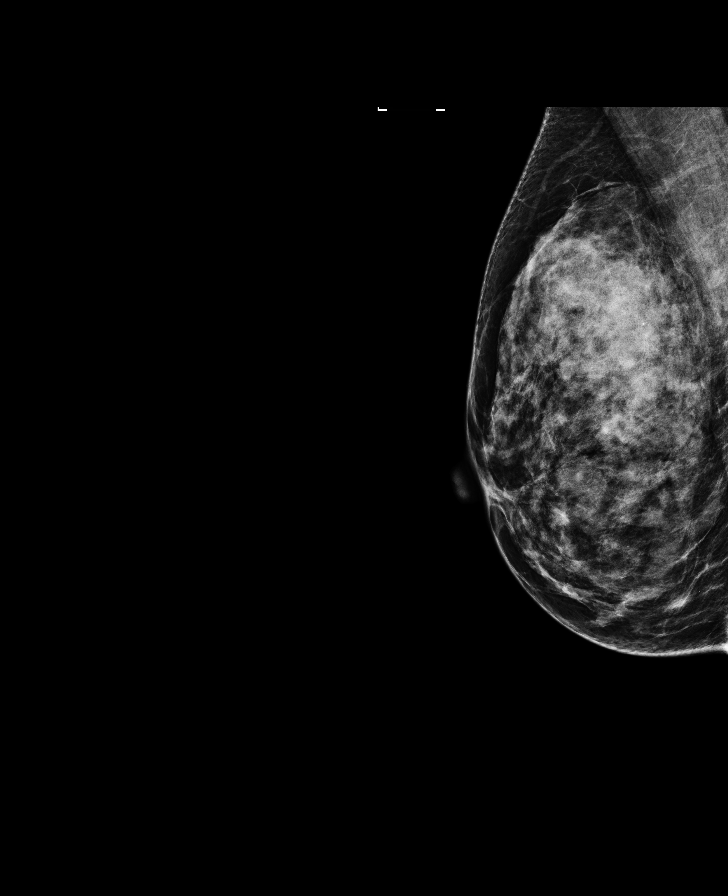

[R MLO synth-2D]
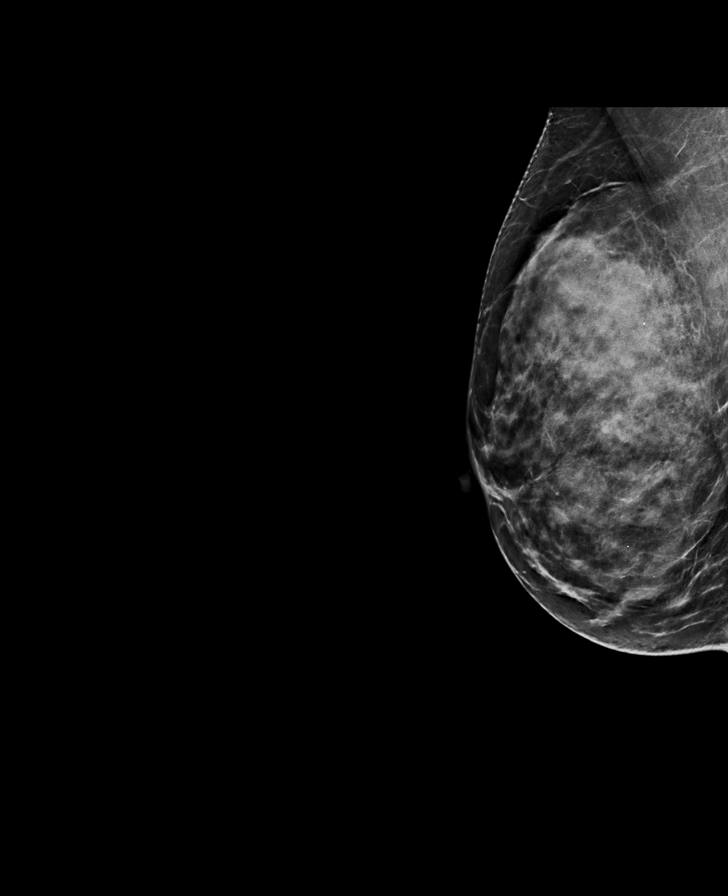

[R CC (1 of 2)]
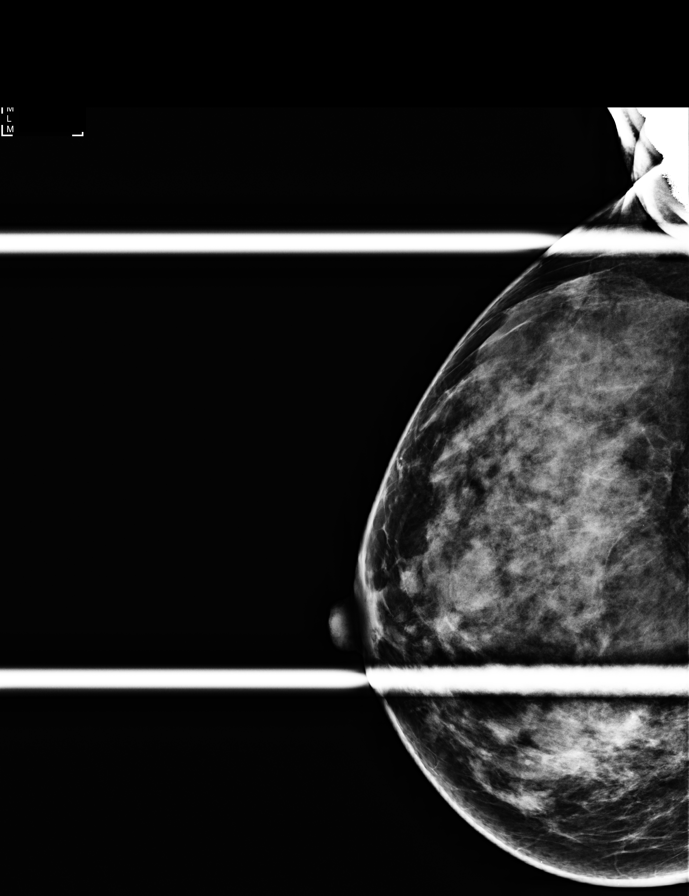

[L MLO synth-2D]
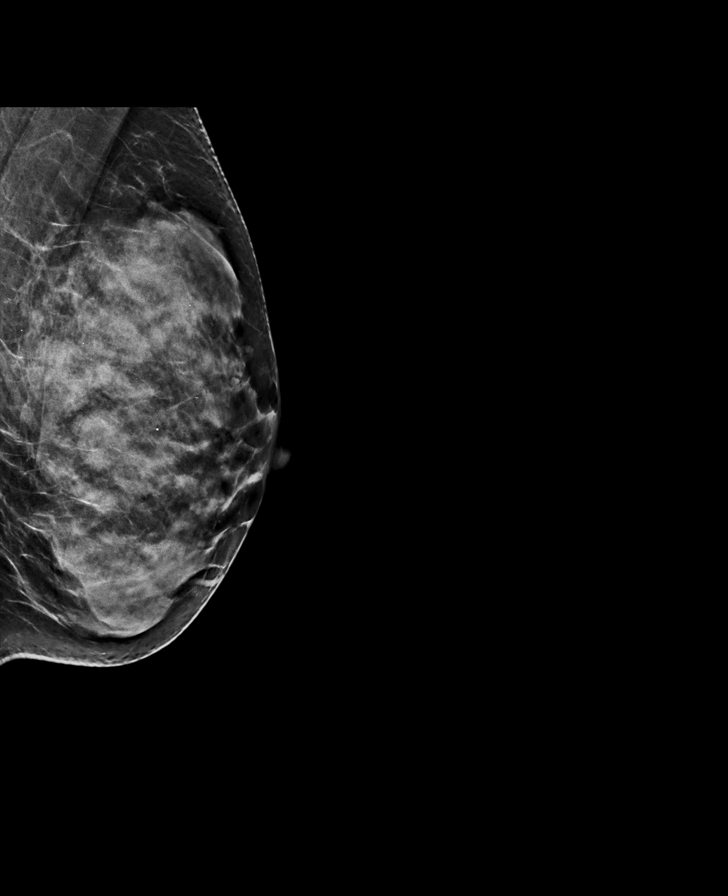

[R CC (2 of 2)]
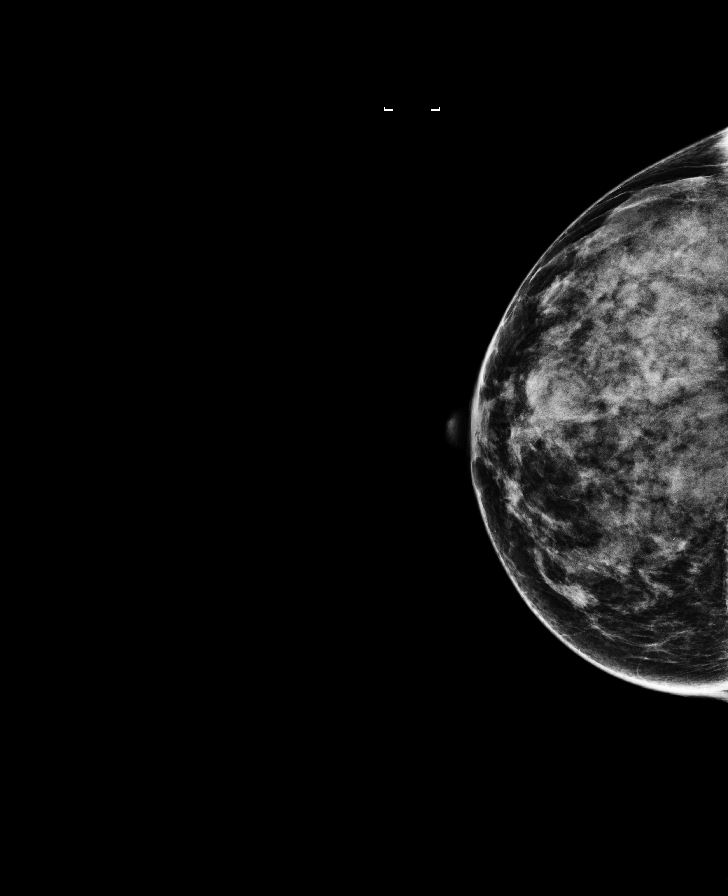

[L CC synth-2D]
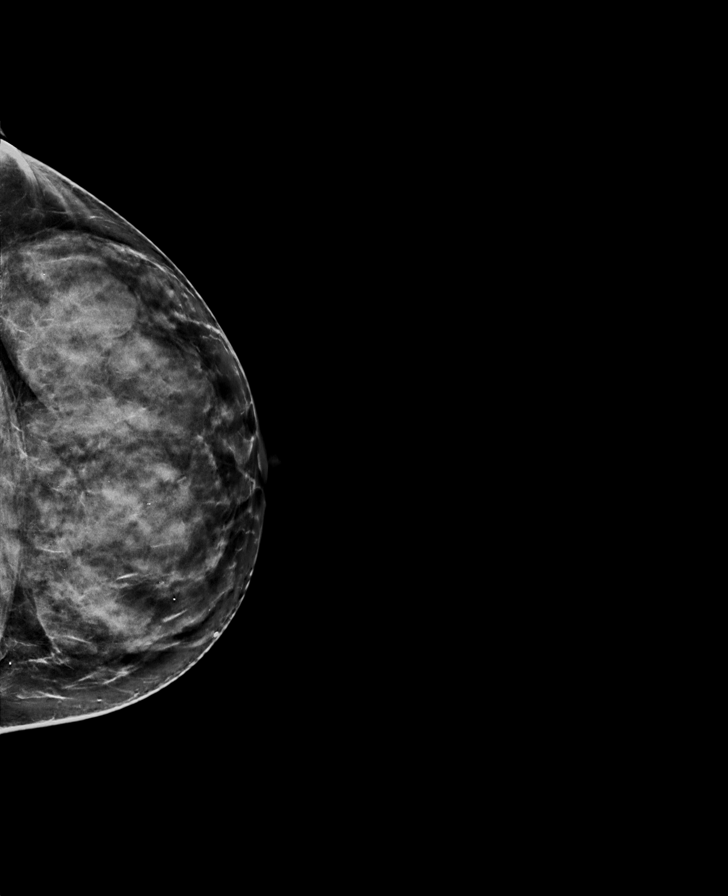

[8 of 34 positions shown; findings below may reference images not displayed]

ACR Breast Density Category c: The breast tissue is heterogeneously
dense, which may obscure small masses.
FINDINGS: Multiple waxing and waning masses are identified in both breasts,
consistent with known fibrocystic changes. There is distortion in
the lateral left breast only seen on the CC view. Based on 3D
images, it should be located inferior to the level of the nipple.
The distortion improves but does not resolve on the spot tomographic
images.

Mammographic images were processed with CAD.

On physical exam, no suspicious lumps are identified.

Targeted ultrasound is performed, showing a hypoechoic mass in the
left breast at 10 o'clock, 3 cm from the nipple measuring 10 x 7 x 9
mm today versus 10 x 8 x 10 mm previously. There is a focal region
of dense shadowing in the right breast at [DATE], 2 cm from the nipple
measuring 6 x 10 x 6 mm. This is not located as laterally as
expected for the distortion seen mammographically. No axillary
adenopathy.
IMPRESSION: Focal shadowing in the right breast at [DATE], 2 cm from the nipple.
This does not convincingly correlate with the mammographically seen
distortion. Distortion in the lateral right breast. The left breast
mass has been stable for 2 years and is benign. No adenopathy.

RECOMMENDATION:
Ultrasound-guided biopsy of the focal shadowing in the right breast
at [DATE], 2 cm from the nipple. Recommend clip placement with
follow-up mammogram. If the clip does not correlate with the
distortion recommend stereotactic biopsy of the distortion.

I have discussed the findings and recommendations with the patient.
Results were also provided in writing at the conclusion of the
visit. If applicable, a reminder letter will be sent to the patient
regarding the next appointment.

BI-RADS CATEGORY  4: Suspicious.

## 2017-07-26 ENCOUNTER — Ambulatory Visit: Payer: BC Managed Care – PPO | Admitting: Family Medicine

## 2017-07-26 ENCOUNTER — Encounter: Payer: Self-pay | Admitting: Family Medicine

## 2017-07-26 VITALS — BP 120/64 | HR 75 | Temp 97.8°F | Ht 66.0 in | Wt 128.8 lb

## 2017-07-26 DIAGNOSIS — S5011XA Contusion of right forearm, initial encounter: Secondary | ICD-10-CM

## 2017-07-26 NOTE — Progress Notes (Signed)
   Subjective:    Patient ID: Regina Mendez, female    DOB: October 10, 1960, 57 y.o.   MRN: [id number]  HPI Here for an injury to the right forearm which occurred yesterday at home. She was working in her garden when a hand spade fell off a shelf and struck the arm. It quickly developed swelling and some pain. She applied ice and wrapped it with an Ace wrap. Over night the swelling got worse and this morning the swelling extended into the 3rd finger. She applied ice again and now the swelling has receded quite a bit. The pain is minimal.    Review of Systems  Constitutional: Negative.   Respiratory: Negative.   Cardiovascular: Negative.   Musculoskeletal: Positive for arthralgias and joint swelling.       Objective:   Physical Exam  Constitutional: She appears well-developed and well-nourished. No distress.  Cardiovascular: Normal rate, regular rhythm, normal heart sounds and intact distal pulses.  Pulmonary/Chest: Effort normal and breath sounds normal.  Musculoskeletal:  She is tender along the distal right forearm on the radial side with some mild swelling. No erythema or warmth. No swelling in the fingers. ROM is full           Assessment & Plan:  Contusion to the forearm. This is not a serious injury and should resolve in the next week to 10 days. Advised her to rest the arm and hand, apply ice, and take Meloxicam every day. Recheck prn.  Alysia Penna, MD

## 2017-07-27 ENCOUNTER — Ambulatory Visit: Payer: BC Managed Care – PPO | Admitting: Family Medicine

## 2017-07-31 ENCOUNTER — Other Ambulatory Visit: Payer: Self-pay

## 2017-07-31 ENCOUNTER — Ambulatory Visit: Payer: BC Managed Care – PPO | Admitting: Family Medicine

## 2017-07-31 ENCOUNTER — Encounter: Payer: Self-pay | Admitting: Family Medicine

## 2017-07-31 VITALS — BP 117/76 | HR 76 | Temp 98.2°F | Ht 64.0 in | Wt 130.4 lb

## 2017-07-31 DIAGNOSIS — S40811D Abrasion of right upper arm, subsequent encounter: Secondary | ICD-10-CM | POA: Diagnosis not present

## 2017-07-31 DIAGNOSIS — S5011XD Contusion of right forearm, subsequent encounter: Secondary | ICD-10-CM

## 2017-07-31 NOTE — Patient Instructions (Addendum)
The forearm abrasion does not appear infected.  Soreness in the muscles are likely due to contusion. There does not appear to be any injury to the bone or weakness. Those areas should continue to improve, but if you notice any increased swelling, redness or any worsening, I would recommend evaluation with hand specialist.   Return to the clinic or go to the nearest emergency room if any of your symptoms worsen or new symptoms occur.   IF you received an x-ray today, you will receive an invoice from [facility]. Please contact [facility] at [phone] with questions or concerns regarding your invoice.   IF you received labwork today, you will receive an invoice from [facility]. Please contact [facility] at [phone] with questions or concerns regarding your invoice.   Our billing staff will not be able to assist you with questions regarding bills from these companies.  You will be contacted with the lab results as soon as they are available. The fastest way to get your results is to activate your My Chart account. Instructions are located on the last page of this paperwork. If you have not heard from [place] regarding the results in 2 weeks, please contact this office.
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
## 2017-08-03 ENCOUNTER — Encounter: Payer: Self-pay | Admitting: Family Medicine

## 2017-10-17 ENCOUNTER — Encounter: Payer: Self-pay | Admitting: Gynecology

## 2017-10-17 ENCOUNTER — Ambulatory Visit: Payer: BC Managed Care – PPO | Admitting: Gynecology

## 2017-10-17 VITALS — BP 118/76

## 2017-10-17 DIAGNOSIS — M79621 Pain in right upper arm: Secondary | ICD-10-CM | POA: Diagnosis not present

## 2017-10-17 DIAGNOSIS — Z113 Encounter for screening for infections with a predominantly sexual mode of transmission: Secondary | ICD-10-CM

## 2017-10-17 NOTE — Patient Instructions (Signed)
Follow-up in 2 months for blood testing.

## 2017-10-17 NOTE — Progress Notes (Signed)
    Regina Mendez [birth year] [id number]        57 y.o.  G2P2002 presents with 2 issues:  1.  Over the past month patient has noticed upper arm discomfort and some involvement of the right axilla.  She thinks it is a way she is sleeping and actually the pain is getting better but just wanted to make sure it was not from her breast.  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  She had an MRI and mammogram in March 2019 both of which were negative and she is continuing to actively follow-up with them due to her breast cancer risk. 2.  Unprotected intercourse 2 days ago requesting STD screening.  No known exposure.  Past medical history,surgical history, problem list, medications, allergies, family history and social history were all reviewed and documented in the EPIC chart.  Directed ROS with pertinent positives and negatives documented in the history of present illness/assessment and plan.  Exam: [name] assistant Vitals:   10/17/17 0910  BP: 118/76   General appearance:  Normal Both breasts examined lying and sitting without masses, retractions, discharge, adenopathy.  Right arm with normal range of motion. Abdomen soft nontender without masses guarding rebound Pelvic external BUS vagina normal.  Cervix normal.  Uterus normal size midline mobile nontender.  Adnexa without masses or tenderness  Assessment/Plan:  57 y.o. G2P2002  1.  Right upper arm/axilla discomfort.  Exam is normal.  Recent mammography and MRI were negative.  We discussed most likely musculoskeletal discomfort.  As it is getting better than we will monitor for now and as long as it totally resolves then will follow.  She will continue with self breast exams regularly and if any palpable abnormalities or her discomfort does not resolve she will follow-up for further evaluation.  She will continue to follow-up with [facility] in reference to her breast health. 2.  STD  screening.  GC/Chlamydia screen done today.  We reviewed with exposure 2 days ago serum screening would not have time enough to convert if indeed it would.  Baseline screening now for past exposure discussed but felt unnecessary.  Recommended patient follow-up in 2 months for serum screening and Future orders placed for HIV RPR hepatitis B and hepatitis C.  We reviewed HPV and HSV the patient will report any genital lesions over the next several months if they occur.  We also discussed counseling and she is pursuing this.    [name] MD, 9:36 AM 10/17/2017

## 2017-10-18 LAB — C. TRACHOMATIS/N. GONORRHOEAE RNA
C. trachomatis RNA, TMA: NOT DETECTED
N. GONORRHOEAE RNA, TMA: NOT DETECTED

## 2017-11-29 ENCOUNTER — Other Ambulatory Visit: Payer: Self-pay | Admitting: Family Medicine

## 2017-11-29 MED ORDER — TRETINOIN 0.05 % EX CREA: TOPICAL_CREAM | Freq: Every day | CUTANEOUS | 5 refills | Status: DC

## 2017-11-29 NOTE — Telephone Encounter (Signed)
Copied from [place] [id number]. Topic: Quick Communication - See Telephone Encounter >> Nov 29, 2017 12:12 PM Conception Chancy, NT wrote: CRM for notification. See Telephone encounter for: 11/29/17.  Patient is requesting a refill on tretinoin (RETIN-A) 0.05 % cream.  [facility] [address] [place] [address] Phone: [phone] Fax: [phone]

## 2017-12-01 ENCOUNTER — Encounter: Payer: Self-pay | Admitting: Family Medicine

## 2017-12-01 ENCOUNTER — Ambulatory Visit: Payer: Self-pay | Admitting: *Deleted

## 2017-12-01 ENCOUNTER — Ambulatory Visit: Payer: BC Managed Care – PPO | Admitting: Family Medicine

## 2017-12-01 VITALS — BP 122/70 | HR 77 | Temp 98.3°F | Wt 127.3 lb

## 2017-12-01 DIAGNOSIS — T783XXA Angioneurotic edema, initial encounter: Secondary | ICD-10-CM

## 2017-12-01 MED ORDER — EPINEPHRINE 0.3 MG/0.3ML IJ SOAJ: 0.3000 mg | Freq: Once | INTRAMUSCULAR | 2 refills | Status: AC

## 2017-12-01 MED ORDER — EPINEPHRINE 0.3 MG/0.3ML IJ SOAJ: 0.3000 mg | Freq: Once | INTRAMUSCULAR | 2 refills | Status: DC

## 2017-12-01 MED ORDER — PREDNISONE 20 MG PO TABS: 40.0000 mg | ORAL_TABLET | Freq: Every day | ORAL | 0 refills | Status: DC

## 2017-12-01 MED ORDER — TRETINOIN 0.05 % EX CREA: TOPICAL_CREAM | Freq: Every day | CUTANEOUS | 5 refills | Status: DC

## 2017-12-01 MED ORDER — PREDNISONE 20 MG PO TABS: 40.0000 mg | ORAL_TABLET | Freq: Every day | ORAL | 0 refills | Status: AC

## 2017-12-01 NOTE — Telephone Encounter (Signed)
Pt called in c/o her hips being swollen and tight this morning.   She woke up this way.   Denies any new medications, soaps, etc.  Denies any swelling of tongue or throat or anything else.  Denies chest pain or difficulty breathing.  I have scheduled her with Dr. Ethlyn Gallery for today at 10:45.    Reason for Disposition . [1] Mild facial swelling from food reaction AND [2] diagnosis never confirmed by a HCP  Answer Assessment - Initial Assessment Questions 1. ONSET: "When did the swelling start?" (e.g., minutes, hours, days)     I woke up my lips swollen.  No new medications. 2. LOCATION: "What part of the face is swollen?"     Upper and lower lips. 3. SEVERITY: "How swollen is it?"     A 1/3 the size of my normal. 4. ITCHING: "Is there any itching?" If so, ask: "How much?"   (Scale 1-10; mild, moderate or severe)     No just tight 5. PAIN: "Is the swelling painful to touch?" If so, ask: "How painful is it?"   (Scale 1-10; mild, moderate or severe)     No  My lips are so dry. 6. FEVER: "Do you have a fever?" If so, ask: "What is it, how was it measured, and when did it start?"      No 7. CAUSE: "What do you think is causing the face swelling?"     I don't know. 8. RECURRENT SYMPTOM: "Have you had face swelling before?" If so, ask: "When was the last time?" "What happened that time?"     No 9. OTHER SYMPTOMS: "Do you have any other symptoms?" (e.g., toothache, leg swelling)     No other symptoms. 10. PREGNANCY: "Is there any chance you are pregnant?" "When was your last menstrual period?"       Not asked due to age  Protocols used: [facility]

## 2017-12-01 NOTE — Patient Instructions (Addendum)
Take benadryl three times daily for next couple of days. Avoid all new foods/supplements. Monitor diet and possible reactions.   Prednisone if worsening of lips.   If any tongue swelling; difficulty with breathing then use epipen and proceed to ER.   If lips re-swell, let me know and we will send to allergy.  Angioedema Angioedema is the sudden swelling of tissue in the body. Angioedema can affect any part of the body, but it most often affects the deeper parts of the skin, causing red, itchy patches (hives) to appear over the affected area. It often begins during the night and is found in the morning. Depending on the cause, angioedema may happen:  Only once.  Several times. It may come back in unpredictable patterns.  Repeatedly for several years. Over time, it may gradually stop coming back.  Angioedema can be life-threatening if it affects the air passages that you breathe through. What are the causes? This condition may be caused by:  Foods, such as milk, eggs, shellfish, wheat, or nuts.  Certain medicines, such as ACE inhibitors, antibiotics, nonsteroidal anti-inflammatory drugs, birth control pills, or dyes used in X-rays.  Insect stings.  Infections.  Angioedema can be inherited, and episodes can be triggered by:  Mild injury.  Dental work.  Surgery.  Stress.  Sudden changes in temperature.  Exercise.  In some cases, the cause of this condition is not known. What are the signs or symptoms? Symptoms of this condition depend on where the swelling happens. Symptoms may include:  Swollen skin.  Red, itchy patches of skin (hives).  Redness in the affected area.  Pain in the affected area.  Swollen lips or tongue.  Wheezing.  Breathing problems.  If your internal organs are involved, symptoms may also include:  Nausea.  Abdominal pain.  Vomiting.  Difficulty swallowing.  Difficulty passing urine.  How is this diagnosed? This condition may be  diagnosed based on:  An exam of the affected area.  Your medical history.  Whether anyone in your family has had this condition before.  A review of any medicines you have been taking.  Tests, including: ? Allergy skin tests to see if the condition was caused by an allergic reaction. ? Blood tests to see if the condition was caused by a gene. ? Tests to check for underlying diseases that could cause the condition.  How is this treated? Treatment for this condition depends on the cause. It may involve any of the following:  If something triggered the condition, making changes to keep it from triggering the condition again.  If the condition affects your breathing, having tubes placed in your airway to keep it open.  Taking medicines to treat symptoms or prevent future episodes. These may include: ? Antihistamines. ? Epinephrine injections. ? Steroids.  If your condition is severe, you may need to be treated at the hospital. Angioedema usually gets better in 24-48 hours. Follow these instructions at home:  Take over-the-counter and prescription medicines only as told by your health care provider.  If you were given medicines for emergency allergy treatment, always carry them with you.  Wear a medical bracelet as told by your health care provider.  If something triggers your condition, avoid the trigger, if possible.  If your condition is inherited and you are thinking about having children, talk to your health care provider. It is important to discuss the risks of passing on the condition to your children. Contact a health care provider if:  You  have repeated episodes of angioedema.  Episodes of angioedema start to happen more often than they used to, even after you take steps to prevent them.  You have episodes of angioedema that are more severe than they have been before, even after you take steps to prevent them.  You are thinking about having children. Get help right  away if:  You have severe swelling of your mouth, tongue, or lips.  You have trouble breathing.  You have trouble swallowing.  You faint. This information is not intended to replace advice given to you by your health care provider. Make sure you discuss any questions you have with your health care provider. Document Released: 03/28/2001 Document Revised: 08/15/2015 Document Reviewed: 07/28/2015 Elsevier Interactive Patient Education  [employer].

## 2017-12-01 NOTE — Progress Notes (Signed)
Jeronimo Norma DOB: 11-21-1960 Encounter date: 12/01/2017  This is a 57 y.o. female who presents with Chief Complaint  Patient presents with  . lips swollen    swelling has started to go down, carrots, palmettos, garlec, tomato lettus, oil&veniger, 1/2 cup of whine and cheese at dinner,     History of present illness:  Felt that lips were very dry, but then realized that lips were tight, swollen. Never happened to her in past. Has never had food allergies. Tongue does not feel swollen. No new lip products.  Last night tried new vinegar from [employer] and new olive oil. Tried a cheese and wine, chocolate. Green tea seems to help with joint pain so this is new.   Tried turmeric in morning with vitamins.   Didn't take anything this morning. Is improving. Had some allergy sx last night (slight cough; some congestion, but this can be typical for her).       Allergies  Allergen Reactions  . Codeine Other (See Comments)    Drowsy  . Sulfonamide Derivatives Dermatitis   Current Meds  Medication Sig  . albuterol (PROVENTIL HFA;VENTOLIN HFA) 108 (90 Base) MCG/ACT inhaler Inhale 2 puffs into the lungs every 6 (six) hours as needed.  . beclomethasone (QVAR) 40 MCG/ACT inhaler Inhale 2 puffs into the lungs 2 (two) times daily.  Marland Kitchen BIOTIN PO Take by mouth.  . clindamycin (CLEOCIN-T) 1 % external solution Apply topically 2 (two) times daily.  . meloxicam (MOBIC) 7.5 MG tablet Take 1 tablet (7.5 mg total) by mouth daily.  . Multiple Vitamins-Minerals (HAIR/SKIN/NAILS PO) Take by mouth.  . tretinoin (RETIN-A) 0.05 % cream Apply topically at bedtime.  Marland Kitchen venlafaxine XR (EFFEXOR-XR) 37.5 MG 24 hr capsule Take 1 capsule (37.5 mg total) by mouth daily with breakfast.  . [DISCONTINUED] tretinoin (RETIN-A) 0.05 % cream Apply topically at bedtime.    Review of Systems  Constitutional: Negative for chills, fatigue and fever.  HENT: Negative for mouth sores, postnasal drip, sinus pressure and  sinus pain. Congestion: mild. Facial swelling: lip swelling.   Respiratory: Negative for cough, chest tightness, shortness of breath and wheezing.   Cardiovascular: Negative for chest pain, palpitations and leg swelling.  Skin: Negative for color change and rash.    Objective:  BP 122/70 (BP Location: Left Arm, Patient Position: Sitting, Cuff Size: Normal)   Pulse 77   Temp 98.3 F (36.8 C) (Oral)   Wt 127 lb 4.8 oz (57.7 kg)   LMP 08/13/2012   SpO2 94%   BMI 21.85 kg/m   Weight: 127 lb 4.8 oz (57.7 kg)   BP Readings from Last 3 Encounters:  12/01/17 122/70  10/17/17 118/76  07/31/17 117/76   Wt Readings from Last 3 Encounters:  12/01/17 127 lb 4.8 oz (57.7 kg)  07/31/17 130 lb 6.4 oz (59.1 kg)  07/26/17 128 lb 12.8 oz (58.4 kg)    Physical Exam  Constitutional: She is oriented to person, place, and time. She appears well-developed and well-nourished. No distress.  HENT:  Head: Normocephalic and atraumatic.  Right Ear: Tympanic membrane, external ear and ear canal normal.  Left Ear: Tympanic membrane, external ear and ear canal normal.  Mouth/Throat: Uvula is midline, oropharynx is clear and moist and mucous membranes are normal. No oral lesions. No uvula swelling or dental caries. No oropharyngeal exudate or posterior oropharyngeal erythema.  Upper and lower lips are swollen.   No tongue swelling  Cardiovascular: Normal rate, regular rhythm and normal heart sounds. Exam  reveals no friction rub.  No murmur heard. No lower extremity edema  Pulmonary/Chest: Effort normal and breath sounds normal. No respiratory distress. She has no wheezes. She has no rales.  Neurological: She is alert and oriented to person, place, and time.  Psychiatric: Her behavior is normal. Cognition and memory are normal.    Assessment/Plan 1. Angioedema, initial encounter Uncertain trigger at this point. Avoid all new foods/substances  - predniSONE (DELTASONE) 20 MG tablet; Take 2 tablets (40  mg total) by mouth daily with breakfast for 5 days.  Dispense: 10 tablet; Refill: 0 - EPINEPHrine 0.3 mg/0.3 mL IJ SOAJ injection; Inject 0.3 mLs (0.3 mg total) into the muscle once for 1 dose.  Dispense: 1 Device; Refill: 2 discussed use of this for emergency if tongue/throat swelling. If used go to ER.   Take benadryl three times daily for next couple of days. Avoid all new foods/supplements. Monitor diet and possible reactions.   Prednisone if worsening of lips.   If any tongue swelling; difficulty with breathing then use epipen and proceed to ER.   If lips re-swell, let me know and we will send to allergy.     Micheline Rough, MD

## 2017-12-11 ENCOUNTER — Encounter: Payer: Self-pay | Admitting: Family Medicine

## 2017-12-11 ENCOUNTER — Other Ambulatory Visit: Payer: Self-pay

## 2017-12-11 ENCOUNTER — Ambulatory Visit: Payer: BC Managed Care – PPO | Admitting: Family Medicine

## 2017-12-11 VITALS — BP 99/65 | HR 92 | Temp 98.3°F | Ht 64.0 in | Wt 127.2 lb

## 2017-12-11 DIAGNOSIS — M79645 Pain in left finger(s): Secondary | ICD-10-CM | POA: Diagnosis not present

## 2017-12-11 DIAGNOSIS — S60941A Unspecified superficial injury of left index finger, initial encounter: Secondary | ICD-10-CM | POA: Diagnosis not present

## 2017-12-11 DIAGNOSIS — T148XXA Other injury of unspecified body region, initial encounter: Secondary | ICD-10-CM

## 2017-12-11 DIAGNOSIS — H6982 Other specified disorders of Eustachian tube, left ear: Secondary | ICD-10-CM

## 2017-12-11 MED ORDER — CEPHALEXIN 500 MG PO CAPS: 500.0000 mg | ORAL_CAPSULE | Freq: Two times a day (BID) | ORAL | 0 refills | Status: DC

## 2017-12-11 NOTE — Patient Instructions (Addendum)
Clean area on finger with soap and water.  If not continuing to improve in next 2 days, start antibiotic that was printed. Return to the clinic or go to the nearest emergency room if any of your symptoms worsen or new symptoms occur.  Although there is very minimal wax in your ears, I do not see any sign of fluid or problems with the eardrum.  Water feeling may be due to eustachian tube dysfunction from allergies.  See information below.  Continue Flonase nasal spray, saline nasal spray as needed.  Return to the clinic or go to the nearest emergency room if any of your symptoms worsen or new symptoms occur.   Eustachian Tube Dysfunction The eustachian tube connects the middle ear to the back of the nose. It regulates air pressure in the middle ear by allowing air to move between the ear and nose. It also helps to drain fluid from the middle ear space. When the eustachian tube does not function properly, air pressure, fluid, or both can build up in the middle ear. Eustachian tube dysfunction can affect one or both ears. What are the causes? This condition happens when the eustachian tube becomes blocked or cannot open normally. This may result from:  Ear infections.  Colds and other upper respiratory infections.  Allergies.  Irritation, such as from cigarette smoke or acid from the stomach coming up into the esophagus (gastroesophageal reflux).  Sudden changes in air pressure, such as from descending in an airplane.  Abnormal growths in the nose or throat, such as nasal polyps, tumors, or enlarged tissue at the back of the throat (adenoids).  What increases the risk? This condition may be more likely to develop in people who smoke and people who are overweight. Eustachian tube dysfunction may also be more likely to develop in children, especially children who have:  Certain birth defects of the mouth, such as cleft palate.  Large tonsils and adenoids.  What are the signs or  symptoms? Symptoms of this condition may include:  A feeling of fullness in the ear.  Ear pain.  Clicking or popping noises in the ear.  Ringing in the ear.  Hearing loss.  Loss of balance.  Symptoms may get worse when the air pressure around you changes, such as when you travel to an area of high elevation or fly on an airplane. How is this diagnosed? This condition may be diagnosed based on:  Your symptoms.  A physical exam of your ear, nose, and throat.  Tests, such as those that measure: ? The movement of your eardrum (tympanogram). ? Your hearing (audiometry).  How is this treated? Treatment depends on the cause and severity of your condition. If your symptoms are mild, you may be able to relieve your symptoms by moving air into ("popping") your ears. If you have symptoms of fluid in your ears, treatment may include:  Decongestants.  Antihistamines.  Nasal sprays or ear drops that contain medicines that reduce swelling (steroids).  In some cases, you may need to have a procedure to drain the fluid in your eardrum (myringotomy). In this procedure, a small tube is placed in the eardrum to:  Drain the fluid.  Restore the air in the middle ear space.  Follow these instructions at home:  Take over-the-counter and prescription medicines only as told by your health care provider.  Use techniques to help pop your ears as recommended by your health care provider. These may include: ? Chewing gum. ? Yawning. ?  Frequent, forceful swallowing. ? Closing your mouth, holding your nose closed, and gently blowing as if you are trying to blow air out of your nose.  Do not do any of the following until your health care provider approves: ? Travel to high altitudes. ? Fly in airplanes. ? Work in a Pension scheme manager or room. ? Scuba dive.  Keep your ears dry. Dry your ears completely after showering or bathing.  Do not smoke.  Keep all follow-up visits as told by your  health care provider. This is important. Contact a health care provider if:  Your symptoms do not go away after treatment.  Your symptoms come back after treatment.  You are unable to pop your ears.  You have: ? A fever. ? Pain in your ear. ? Pain in your head or neck. ? Fluid draining from your ear.  Your hearing suddenly changes.  You become very dizzy.  You lose your balance. This information is not intended to replace advice given to you by your health care provider. Make sure you discuss any questions you have with your health care provider. Document Released: 02/13/2015 Document Revised: 06/25/2015 Document Reviewed: 02/05/2014 Elsevier Interactive Patient Education  2018 [place].  Puncture Wound A puncture wound is an injury that is caused by a sharp, thin object that goes through (penetrates) your skin. Usually, a puncture wound does not leave a large opening in your skin, so it may not bleed a lot. However, when you get a puncture wound, dirt or other materials (foreign bodies) can be forced into your wound and break off inside. This increases the chance of infection, such as tetanus. What are the causes? Puncture wounds are caused by any sharp, thin object that goes through your skin, such as:  Animal teeth, as with an animal bite.  Sharp, pointed objects, such as nails, splinters of glass, fishhooks, and needles.  What are the signs or symptoms? Symptoms of a puncture wound include:  Pain.  Bleeding.  Swelling.  Bruising.  Fluid leaking from the wound.  Numbness, tingling, or loss of function.  How is this diagnosed? This condition is diagnosed with a medical history and physical exam. Your wound will be checked to see if it contains any foreign bodies. You may also have X-rays or other imaging tests. How is this treated? Treatment for a puncture wound depends on how serious the wound is. It also depends on whether the wound contains any foreign  bodies. Treatment for all types of puncture wounds usually starts with:  Controlling the bleeding.  Washing out the wound with a germ-free (sterile) salt-water solution.  Checking the wound for foreign bodies.  Treatment may also include:  Having the wound opened surgically to remove a foreign object.  Closing the wound with stitches (sutures) if it continues to bleed.  Covering the wound with antibiotic ointments and a bandage (dressing).  Receiving a tetanus shot.  Receiving a rabies vaccine.  Follow these instructions at home: Medicines  Take or apply over-the-counter and prescription medicines only as told by your health care provider.  If you were prescribed an antibiotic, take or apply it as told by your health care provider. Do not stop using the antibiotic even if your condition improves. Wound care  There are many ways to close and cover a wound. For example, a wound can be covered with sutures, skin glue, or adhesive strips.Follow instructions from your health care provider about: ? How to take care of your wound. ?  When and how you should change your dressing. ? When you should remove your dressing. ? Removing whatever was used to close your wound.  Keep the dressing dry as told by your health care provider. Do not take baths, swim, use a hot tub, or do anything that would put your wound underwater until your health care provider approves.  Clean the wound as told by your health care provider.  Do not scratch or pick at the wound.  Check your wound every day for signs of infection. Watch for: ? Redness, swelling, or pain. ? Fluid, blood, or pus. General instructions  Raise (elevate) the injured area above the level of your heart while you are sitting or lying down.  If your puncture wound is in your foot, ask your health care provider if you need to avoid putting weight on your foot and for how long.  Keep all follow-up visits as told by your health care  provider. This is important. Contact a health care provider if:  You received a tetanus shot and you have swelling, severe pain, redness, or bleeding at the injection site.  You have a fever.  Your sutures come out.  You notice a bad smell coming from your wound or your dressing.  You notice something coming out of your wound, such as wood or glass.  Your pain is not controlled with medicine.  You have increased redness, swelling, or pain at the site of your wound.  You have fluid, blood, or pus coming from your wound.  You notice a change in the color of your skin near your wound.  You need to change the dressing frequently due to fluid, blood, or pus draining from your wound.  You develop a new rash.  You develop numbness around your wound. Get help right away if:  You develop severe swelling around your wound.  Your pain suddenly increases and is severe.  You develop painful skin lumps.  You have a red streak going away from your wound.  The wound is on your hand or foot and you cannot properly move a finger or toe.  The wound is on your hand or foot and you notice that your fingers or toes look pale or bluish. This information is not intended to replace advice given to you by your health care provider. Make sure you discuss any questions you have with your health care provider. Document Released: 10/27/2004 Document Revised: 06/22/2015 Document Reviewed: 03/12/2014 Elsevier Interactive Patient Education  [employer].      If you have lab work done today you will be contacted with your lab results within the next 2 weeks.  If you have not heard from [place] then please contact us. The fastest way to get your results is to register for My Chart.   IF you received an x-ray today, you will receive an invoice from [facility]. Please contact [facility] at [phone] with questions or concerns regarding your invoice.   IF you received labwork  today, you will receive an invoice from [facility]. Please contact [facility] at [phone] with questions or concerns regarding your invoice.   Our billing staff will not be able to assist you with questions regarding bills from these companies.  You will be contacted with the lab results as soon as they are available. The fastest way to get your results is to activate your My Chart account. Instructions are located on the last page of this paperwork. If you have not heard from  [place] regarding the results in 2 weeks, please contact this office.

## 2017-12-11 NOTE — Progress Notes (Signed)
Subjective:    Patient ID: Regina Mendez, female    DOB: September 26, 1960, 57 y.o.   MRN: [id number]  HPI Regina Mendez is a 57 y.o. female Presents today for: Chief Complaint  Patient presents with  . left pointer finger pain    4 days   . left water fullness   L finger pain: Felt something stick end of left index finger after covering [place] 1 week ago. Daughter tried to pull something out "black dot", not sure if anything pulled out.  Treated with alcohol.  Swelling notes 2 days ago more sore. Tried ammonia topical, benadryl pill. No fever.  Swelling a little bit better, but still there. No itch, just sore. R hand dominant.   L ear feels full: For past 1 day.  Water sound last night. Has been using Neti pot for allergies. Tried swimmer's ear drops last night. Still feels a little congested. Has been flnase nasal spray for allergies after netipot. Able to hear normally out of ear, no pain - feels full only.  No fever.    Patient Active Problem List   Diagnosis Date Noted  . History of colon polyps 10/12/2016  . 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 Routine general medical examination at a health care facility 01/14/2014  . Chronic insomnia 10/26/2012  . Joint pain 08/16/2011  . Cervical dysplasia   . 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 Allergic rhinitis 09/07/2009  . MIGRAINE HEADACHE 07/05/2007  . LUMP OR MASS IN BREAST 07/05/2007  . MENOPAUSE, EARLY 07/05/2007   Past Medical History:  Diagnosis Date  . Acne   . Breast cyst   . Migraine headache    Past Surgical History:  Procedure Laterality Date  . APPENDECTOMY    . BREAST SURGERY     right breast bx  . BREAST SURGERY     Right lumpectomy -dysplastic cells-2018  . COLPOSCOPY    . GYNECOLOGIC CRYOSURGERY  age 77  . OVARIAN CYST SURGERY    . TONSILLECTOMY     Allergies  Allergen Reactions  . Codeine Other (See Comments)    Drowsy  . Sulfonamide Derivatives Dermatitis     Prior to Admission medications   Medication Sig Start Date End Date Taking? Authorizing Provider  albuterol (PROVENTIL HFA;VENTOLIN HFA) 108 (90 Base) MCG/ACT inhaler Inhale 2 puffs into the lungs every 6 (six) hours as needed. 10/31/16  Yes Tereasa Coop, PA-C  beclomethasone (QVAR) 40 MCG/ACT inhaler Inhale 2 puffs into the lungs 2 (two) times daily. 10/31/16  Yes Tereasa Coop, PA-C  BIOTIN PO Take by mouth.   Yes [provider]  clindamycin (CLEOCIN-T) 1 % external solution Apply topically 2 (two) times daily. 02/20/17  Yes Dorena Cookey, MD  meloxicam (MOBIC) 7.5 MG tablet Take 1 tablet (7.5 mg total) by mouth daily. 02/20/17  Yes Dorena Cookey, MD  Multiple Vitamins-Minerals (HAIR/SKIN/NAILS PO) Take by mouth.   Yes [provider]  tretinoin (RETIN-A) 0.05 % cream Apply topically at bedtime. 12/01/17  Yes Koberlein, Steele Berg, MD  venlafaxine XR (EFFEXOR-XR) 37.5 MG 24 hr capsule Take 1 capsule (37.5 mg total) by mouth daily with breakfast. 05/09/17  Yes Fontaine, Belinda Block, MD   Social History   Socioeconomic History  . Marital status: Married    Spouse name: Not on file  . Number of children: Not on file  . Years of education: Not on file  . Highest education level: Not on file  Occupational  History  . Not on file  Social Needs  . Financial resource strain: Not on file  . Food insecurity:    Worry: Not on file    Inability: Not on file  . Transportation needs:    Medical: Not on file    Non-medical: Not on file  Tobacco Use  . Smoking status: Never Smoker  . Smokeless tobacco: Never Used  Substance and Sexual Activity  . Alcohol use: Yes    Alcohol/week: 1.0 standard drinks    Types: 1 Standard drinks or equivalent per week  . Drug use: No  . Sexual activity: Yes    Birth control/protection: Post-menopausal    Comment: 1st intercourse 57 yo-More than 5 partners  Lifestyle  . Physical activity:    Days per week: Not on file    Minutes per  session: Not on file  . Stress: Not on file  Relationships  . Social connections:    Talks on phone: Not on file    Gets together: Not on file    Attends religious service: Not on file    Active member of club or organization: Not on file    Attends meetings of clubs or organizations: Not on file    Relationship status: Not on file  . Intimate partner violence:    Fear of current or ex partner: Not on file    Emotionally abused: Not on file    Physically abused: Not on file    Forced sexual activity: Not on file  Other Topics Concern  . Not on file  Social History Narrative  . Not on file    Review of Systems     Objective:   Physical Exam  Constitutional: She is oriented to person, place, and time. She appears well-developed and well-nourished. No distress.  HENT:  Head: Normocephalic and atraumatic.  Right Ear: Hearing, tympanic membrane, external ear and ear canal normal.  Left Ear: Hearing, tympanic membrane, external ear and ear canal normal.  Nose: Nose normal.  Mouth/Throat: Oropharynx is clear and moist. No oropharyngeal exudate.  Minimal nonobstructive cerumen in bilateral canals.  TMs are pearly gray without effusion.  Eyes: Pupils are equal, round, and reactive to light. Conjunctivae and EOM are normal.  Cardiovascular: Normal rate, regular rhythm, normal heart sounds and intact distal pulses.  No murmur heard. Pulmonary/Chest: Effort normal and breath sounds normal. No respiratory distress. She has no wheezes. She has no rhonchi.  Musculoskeletal:       Hands: Neurological: She is alert and oriented to person, place, and time.  Skin: Skin is warm and dry. No rash noted.  Psychiatric: She has a normal mood and affect. Her behavior is normal.  Vitals reviewed.  Vitals:   12/11/17 0949  BP: 99/65  Pulse: 92  Temp: 98.3 F (36.8 C)  TempSrc: Oral  SpO2: 98%  Weight: 127 lb 3.2 oz (57.7 kg)  Height: 5\' 4"  (1.626 m)          Assessment & Plan:     Regina Mendez is a 57 y.o. female Puncture wound - Plan: cephALEXin (KEFLEX) 500 MG capsule Pain of finger of left hand - Plan: cephALEXin (KEFLEX) 500 MG capsule  -Possible puncture wound from plant matter.  Did not see any foreign body at this time.  Late onset swelling may have been early cellulitis, but that is improving at this time.  She is up-to-date on tetanus.  -Continue symptomatic care, option of starting Keflex if not continue to improve  in the next 48 hours, printed prescription.  RTC precautions given.  Dysfunction of left eustachian tube  -Left ear fullness likely due to eustachian tube dysfunction with underlying allergies.  I do not see any sign of otitis media, or externa, and only minimal cerumen.  -Continue Flonase nasal spray for allergies, saline nasal spray or Nettie pot if needed for persistent congestion, RTC precautions given and handout provided.  Meds ordered this encounter  Medications  . cephALEXin (KEFLEX) 500 MG capsule    Sig: Take 1 capsule (500 mg total) by mouth 2 (two) times daily.    Dispense:  14 capsule    Refill:  0   Patient Instructions    Clean area on finger with soap and water.  If not continuing to improve in next 2 days, start antibiotic that was printed. Return to the clinic or go to the nearest emergency room if any of your symptoms worsen or new symptoms occur.  Although there is very minimal wax in your ears, I do not see any sign of fluid or problems with the eardrum.  Water feeling may be due to eustachian tube dysfunction from allergies.  See information below.  Continue Flonase nasal spray, saline nasal spray as needed.  Return to the clinic or go to the nearest emergency room if any of your symptoms worsen or new symptoms occur.   Eustachian Tube Dysfunction The eustachian tube connects the middle ear to the back of the nose. It regulates air pressure in the middle ear by allowing air to move between the ear and nose. It  also helps to drain fluid from the middle ear space. When the eustachian tube does not function properly, air pressure, fluid, or both can build up in the middle ear. Eustachian tube dysfunction can affect one or both ears. What are the causes? This condition happens when the eustachian tube becomes blocked or cannot open normally. This may result from:  Ear infections.  Colds and other upper respiratory infections.  Allergies.  Irritation, such as from cigarette smoke or acid from the stomach coming up into the esophagus (gastroesophageal reflux).  Sudden changes in air pressure, such as from descending in an airplane.  Abnormal growths in the nose or throat, such as nasal polyps, tumors, or enlarged tissue at the back of the throat (adenoids).  What increases the risk? This condition may be more likely to develop in people who smoke and people who are overweight. Eustachian tube dysfunction may also be more likely to develop in children, especially children who have:  Certain birth defects of the mouth, such as cleft palate.  Large tonsils and adenoids.  What are the signs or symptoms? Symptoms of this condition may include:  A feeling of fullness in the ear.  Ear pain.  Clicking or popping noises in the ear.  Ringing in the ear.  Hearing loss.  Loss of balance.  Symptoms may get worse when the air pressure around you changes, such as when you travel to an area of high elevation or fly on an airplane. How is this diagnosed? This condition may be diagnosed based on:  Your symptoms.  A physical exam of your ear, nose, and throat.  Tests, such as those that measure: ? The movement of your eardrum (tympanogram). ? Your hearing (audiometry).  How is this treated? Treatment depends on the cause and severity of your condition. If your symptoms are mild, you may be able to relieve your symptoms by moving air into ("  popping") your ears. If you have symptoms of fluid in  your ears, treatment may include:  Decongestants.  Antihistamines.  Nasal sprays or ear drops that contain medicines that reduce swelling (steroids).  In some cases, you may need to have a procedure to drain the fluid in your eardrum (myringotomy). In this procedure, a small tube is placed in the eardrum to:  Drain the fluid.  Restore the air in the middle ear space.  Follow these instructions at home:  Take over-the-counter and prescription medicines only as told by your health care provider.  Use techniques to help pop your ears as recommended by your health care provider. These may include: ? Chewing gum. ? Yawning. ? Frequent, forceful swallowing. ? Closing your mouth, holding your nose closed, and gently blowing as if you are trying to blow air out of your nose.  Do not do any of the following until your health care provider approves: ? Travel to high altitudes. ? Fly in airplanes. ? Work in a Pension scheme manager or room. ? Scuba dive.  Keep your ears dry. Dry your ears completely after showering or bathing.  Do not smoke.  Keep all follow-up visits as told by your health care provider. This is important. Contact a health care provider if:  Your symptoms do not go away after treatment.  Your symptoms come back after treatment.  You are unable to pop your ears.  You have: ? A fever. ? Pain in your ear. ? Pain in your head or neck. ? Fluid draining from your ear.  Your hearing suddenly changes.  You become very dizzy.  You lose your balance. This information is not intended to replace advice given to you by your health care provider. Make sure you discuss any questions you have with your health care provider. Document Released: 02/13/2015 Document Revised: 06/25/2015 Document Reviewed: 02/05/2014 Elsevier Interactive Patient Education  2018 [employer].  Puncture Wound A puncture wound is an injury that is caused by a sharp, thin object that goes through  (penetrates) your skin. Usually, a puncture wound does not leave a large opening in your skin, so it may not bleed a lot. However, when you get a puncture wound, dirt or other materials (foreign bodies) can be forced into your wound and break off inside. This increases the chance of infection, such as tetanus. What are the causes? Puncture wounds are caused by any sharp, thin object that goes through your skin, such as:  Animal teeth, as with an animal bite.  Sharp, pointed objects, such as nails, splinters of glass, fishhooks, and needles.  What are the signs or symptoms? Symptoms of a puncture wound include:  Pain.  Bleeding.  Swelling.  Bruising.  Fluid leaking from the wound.  Numbness, tingling, or loss of function.  How is this diagnosed? This condition is diagnosed with a medical history and physical exam. Your wound will be checked to see if it contains any foreign bodies. You may also have X-rays or other imaging tests. How is this treated? Treatment for a puncture wound depends on how serious the wound is. It also depends on whether the wound contains any foreign bodies. Treatment for all types of puncture wounds usually starts with:  Controlling the bleeding.  Washing out the wound with a germ-free (sterile) salt-water solution.  Checking the wound for foreign bodies.  Treatment may also include:  Having the wound opened surgically to remove a foreign object.  Closing the wound with stitches (sutures) if  it continues to bleed.  Covering the wound with antibiotic ointments and a bandage (dressing).  Receiving a tetanus shot.  Receiving a rabies vaccine.  Follow these instructions at home: Medicines  Take or apply over-the-counter and prescription medicines only as told by your health care provider.  If you were prescribed an antibiotic, take or apply it as told by your health care provider. Do not stop using the antibiotic even if your condition  improves. Wound care  There are many ways to close and cover a wound. For example, a wound can be covered with sutures, skin glue, or adhesive strips.Follow instructions from your health care provider about: ? How to take care of your wound. ? When and how you should change your dressing. ? When you should remove your dressing. ? Removing whatever was used to close your wound.  Keep the dressing dry as told by your health care provider. Do not take baths, swim, use a hot tub, or do anything that would put your wound underwater until your health care provider approves.  Clean the wound as told by your health care provider.  Do not scratch or pick at the wound.  Check your wound every day for signs of infection. Watch for: ? Redness, swelling, or pain. ? Fluid, blood, or pus. General instructions  Raise (elevate) the injured area above the level of your heart while you are sitting or lying down.  If your puncture wound is in your foot, ask your health care provider if you need to avoid putting weight on your foot and for how long.  Keep all follow-up visits as told by your health care provider. This is important. Contact a health care provider if:  You received a tetanus shot and you have swelling, severe pain, redness, or bleeding at the injection site.  You have a fever.  Your sutures come out.  You notice a bad smell coming from your wound or your dressing.  You notice something coming out of your wound, such as wood or glass.  Your pain is not controlled with medicine.  You have increased redness, swelling, or pain at the site of your wound.  You have fluid, blood, or pus coming from your wound.  You notice a change in the color of your skin near your wound.  You need to change the dressing frequently due to fluid, blood, or pus draining from your wound.  You develop a new rash.  You develop numbness around your wound. Get help right away if:  You develop severe  swelling around your wound.  Your pain suddenly increases and is severe.  You develop painful skin lumps.  You have a red streak going away from your wound.  The wound is on your hand or foot and you cannot properly move a finger or toe.  The wound is on your hand or foot and you notice that your fingers or toes look pale or bluish. This information is not intended to replace advice given to you by your health care provider. Make sure you discuss any questions you have with your health care provider. Document Released: 10/27/2004 Document Revised: 06/22/2015 Document Reviewed: 03/12/2014 Elsevier Interactive Patient Education  [employer].      If you have lab work done today you will be contacted with your lab results within the next 2 weeks.  If you have not heard from [place] then please contact us. The fastest way to get your results is to register for My  Chart.   IF you received an x-ray today, you will receive an invoice from [facility]. Please contact [facility] at [phone] with questions or concerns regarding your invoice.   IF you received labwork today, you will receive an invoice from [facility]. Please contact [facility] at [phone] with questions or concerns regarding your invoice.   Our billing staff will not be able to assist you with questions regarding bills from these companies.  You will be contacted with the lab results as soon as they are available. The fastest way to get your results is to activate your My Chart account. Instructions are located on the last page of this paperwork. If you have not heard from [place] regarding the results in 2 weeks, please contact this office.       Signed,   [name], MD [facility] [place].  12/11/17 10:18 AM

## 2017-12-15 ENCOUNTER — Ambulatory Visit: Payer: BC Managed Care – PPO | Admitting: Family Medicine

## 2017-12-15 ENCOUNTER — Other Ambulatory Visit: Payer: Self-pay

## 2017-12-15 ENCOUNTER — Encounter: Payer: Self-pay | Admitting: Family Medicine

## 2017-12-15 VITALS — BP 108/64 | HR 89 | Temp 98.2°F | Ht 64.0 in | Wt 129.1 lb

## 2017-12-15 DIAGNOSIS — S60451A Superficial foreign body of left index finger, initial encounter: Secondary | ICD-10-CM | POA: Diagnosis not present

## 2017-12-15 NOTE — Progress Notes (Signed)
  Subjective:     Patient ID: Regina Mendez, female   DOB: [birth year], 57 y.o.   MRN: [id number]  HPI Patient here to evaluate left index finger.  She states that she got a thorn in her finger after covering a [place] plant around the last week of October.  She states her daughter tried to pull out something small which was like a small black dot.  She was seen at urgent care and was prescribed Keflex but never took that.  Her tetanus is up-to-date.  She has had only a bit of mild soreness since then but no redness.  No drainage.  Recent episode of some mild swelling upper and lower lip.  None since then.  Denies any edema of the tongue or any hand or foot edema.  No history of known food allergy.  Past Medical History:  Diagnosis Date  . Acne   . Breast cyst   . Migraine headache    Past Surgical History:  Procedure Laterality Date  . APPENDECTOMY    . BREAST SURGERY     right breast bx  . BREAST SURGERY     Right lumpectomy -dysplastic cells-2018  . COLPOSCOPY    . GYNECOLOGIC CRYOSURGERY  age 10  . OVARIAN CYST SURGERY    . TONSILLECTOMY      reports that she has never smoked. She has never used smokeless tobacco. She reports that she drinks about 1.0 standard drinks of alcohol per week. She reports that she does not use drugs. family history includes Breast cancer in her cousin; Heart disease in her father; Hypertension in her father; Thyroid cancer in her mother. Allergies  Allergen Reactions  . Codeine Other (See Comments)    Drowsy  . Sulfonamide Derivatives Dermatitis     Review of Systems  Constitutional: Negative for chills and fever.  HENT: Positive for congestion.   Respiratory: Negative for shortness of breath.   Cardiovascular: Negative for chest pain.  Neurological: Negative for dizziness and syncope.       Objective:   Physical Exam  Constitutional: She appears well-developed and well-nourished.  Cardiovascular: Normal rate and regular rhythm.   Pulmonary/Chest: Effort normal and breath sounds normal.  Skin:  Left index finger reveals small slightly callused area which is presumably site of recent puncture wound.  There is no erythema.  No warmth.  No purulence.  No pustules.  No palpable foreign bodies.       Assessment:     Recent puncture left index finger.  Question is whether she has small amount of retained foreign body- but she has no major inflammatory changes and overall this is greatly improved    Plan:     -Recommend observation for now.  See no need for surgical exploration unless she has any worsening symptoms at this point  Eulas Post MD [facility]

## 2017-12-15 NOTE — Patient Instructions (Signed)
Follow up for any finger redness, swelling, or pain.

## 2017-12-18 ENCOUNTER — Encounter: Payer: Self-pay | Admitting: Family Medicine

## 2017-12-18 ENCOUNTER — Ambulatory Visit: Payer: BC Managed Care – PPO | Admitting: Family Medicine

## 2017-12-18 VITALS — BP 110/54 | HR 71 | Temp 98.2°F | Wt 130.4 lb

## 2017-12-18 DIAGNOSIS — Z23 Encounter for immunization: Secondary | ICD-10-CM

## 2017-12-18 DIAGNOSIS — L089 Local infection of the skin and subcutaneous tissue, unspecified: Secondary | ICD-10-CM | POA: Diagnosis not present

## 2017-12-18 DIAGNOSIS — M545 Low back pain, unspecified: Secondary | ICD-10-CM

## 2017-12-18 MED ORDER — AMOXICILLIN-POT CLAVULANATE 875-125 MG PO TABS: 1.0000 | ORAL_TABLET | Freq: Two times a day (BID) | ORAL | 0 refills | Status: DC

## 2017-12-18 NOTE — Addendum Note (Signed)
Addended by: Rebecca Eaton on: 12/18/2017 01:12 PM   Modules accepted: Orders
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
## 2018-01-11 ENCOUNTER — Encounter: Payer: Self-pay | Admitting: Internal Medicine

## 2018-01-11 ENCOUNTER — Ambulatory Visit: Payer: BC Managed Care – PPO | Admitting: Internal Medicine

## 2018-01-11 VITALS — BP 110/80 | HR 78 | Temp 98.0°F | Wt 126.8 lb

## 2018-01-11 DIAGNOSIS — M542 Cervicalgia: Secondary | ICD-10-CM

## 2018-01-11 NOTE — Patient Instructions (Signed)
-  It was nice to see you today!  -Please arrange follow up with your PCP as scheduled.
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
## 2018-03-02 ENCOUNTER — Ambulatory Visit: Payer: BC Managed Care – PPO | Admitting: Psychology

## 2018-03-02 DIAGNOSIS — F411 Generalized anxiety disorder: Secondary | ICD-10-CM

## 2018-03-13 ENCOUNTER — Ambulatory Visit (INDEPENDENT_AMBULATORY_CARE_PROVIDER_SITE_OTHER): Payer: BC Managed Care – PPO | Admitting: Psychology

## 2018-03-13 DIAGNOSIS — F411 Generalized anxiety disorder: Secondary | ICD-10-CM

## 2018-03-23 ENCOUNTER — Ambulatory Visit: Payer: BC Managed Care – PPO | Admitting: Psychology

## 2018-03-23 DIAGNOSIS — F411 Generalized anxiety disorder: Secondary | ICD-10-CM | POA: Diagnosis not present

## 2018-04-06 ENCOUNTER — Ambulatory Visit: Payer: BC Managed Care – PPO | Admitting: Psychology

## 2018-04-06 DIAGNOSIS — F411 Generalized anxiety disorder: Secondary | ICD-10-CM | POA: Diagnosis not present

## 2018-04-11 ENCOUNTER — Ambulatory Visit: Payer: BC Managed Care – PPO | Admitting: Internal Medicine

## 2018-04-11 ENCOUNTER — Other Ambulatory Visit: Payer: Self-pay

## 2018-04-11 VITALS — BP 98/64 | HR 88 | Temp 98.7°F | Ht 64.0 in | Wt 127.7 lb

## 2018-04-11 DIAGNOSIS — N63 Unspecified lump in unspecified breast: Secondary | ICD-10-CM

## 2018-04-11 DIAGNOSIS — G43809 Other migraine, not intractable, without status migrainosus: Secondary | ICD-10-CM | POA: Diagnosis not present

## 2018-04-11 DIAGNOSIS — N879 Dysplasia of cervix uteri, unspecified: Secondary | ICD-10-CM | POA: Diagnosis not present

## 2018-04-11 DIAGNOSIS — N951 Menopausal and female climacteric states: Secondary | ICD-10-CM | POA: Diagnosis not present

## 2018-04-11 DIAGNOSIS — F5104 Psychophysiologic insomnia: Secondary | ICD-10-CM

## 2018-04-11 NOTE — Progress Notes (Signed)
Established Patient Office Visit     CC/Reason for Visit: Establish care, follow up chronic conditions  HPI: Regina Mendez is a 58 y.o. female who is coming in today for the above mentioned reasons. Past Medical History is significant for: cervical dysplasia (has appt with GYN next month, Dr. Phineas Real), a breast lump that was diagnosed as dysplasia for which she has been recommended to have a yearly breast MRI, but she wants to skip it this year due to concerns of radiation and gadolinium toxicity. She has occasional seasonal allergies and bronchitis. She is under a great deal of stress as her twice 30 yr old daughters have anorexia. One of them was hospitalized for this. She is very concerned about COVID-19 and would like some information about it. 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 She has no acute complaints today.   Past Medical/Surgical History: Past Medical History:  Diagnosis Date  . Acne   . Breast cyst   . Migraine headache     Past Surgical History:  Procedure Laterality Date  . APPENDECTOMY    . BREAST SURGERY     right breast bx  . BREAST SURGERY     Right lumpectomy -dysplastic cells-2018  . COLPOSCOPY    . GYNECOLOGIC CRYOSURGERY  age 51  . OVARIAN CYST SURGERY    . TONSILLECTOMY      Social History:  reports that she has never smoked. She has never used smokeless tobacco. She reports current alcohol use of about 1.0 standard drinks of alcohol per week. She reports that she does not use drugs.  Allergies: Allergies  Allergen Reactions  . Codeine Other (See Comments)    Drowsy  . Sulfonamide Derivatives Dermatitis    Family History:  Family History  Problem Relation Age of Onset  . Thyroid cancer Mother   . Hypertension Father   . Heart disease Father   . Breast cancer Cousin        Mat. 1st cousins X 2     Age 54's     Current Outpatient Medications:  .  albuterol (PROVENTIL HFA;VENTOLIN HFA) 108 (90 Base)  MCG/ACT inhaler, Inhale 2 puffs into the lungs every 6 (six) hours as needed., Disp: 18 g, Rfl: 0 .  amoxicillin-clavulanate (AUGMENTIN) 875-125 MG tablet, Take 1 tablet by mouth 2 (two) times daily., Disp: 14 tablet, Rfl: 0 .  beclomethasone (QVAR) 40 MCG/ACT inhaler, Inhale 2 puffs into the lungs 2 (two) times daily., Disp: 1 Inhaler, Rfl: 3 .  BIOTIN PO, Take by mouth., Disp: , Rfl:  .  clindamycin (CLEOCIN-T) 1 % external solution, Apply topically 2 (two) times daily., Disp: 30 mL, Rfl: 5 .  meloxicam (MOBIC) 7.5 MG tablet, Take 1 tablet (7.5 mg total) by mouth daily., Disp: 30 tablet, Rfl: 6 .  Multiple Vitamins-Minerals (HAIR/SKIN/NAILS PO), Take by mouth., Disp: , Rfl:  .  tretinoin (RETIN-A) 0.05 % cream, Apply topically at bedtime., Disp: 45 g, Rfl: 5 .  venlafaxine XR (EFFEXOR-XR) 37.5 MG 24 hr capsule, Take 1 capsule (37.5 mg total) by mouth daily with breakfast., Disp: 30 capsule, Rfl: 2  Review of Systems:  Constitutional: Denies fever, chills, diaphoresis, appetite change and fatigue.  HEENT: Denies photophobia, eye pain, redness, hearing loss, ear pain, congestion, sore throat, rhinorrhea, sneezing, mouth sores, trouble swallowing, neck pain, neck stiffness and tinnitus.   Respiratory: Denies SOB, DOE, cough, chest tightness,  and wheezing.   Cardiovascular: Denies chest pain, palpitations and  leg swelling.  Gastrointestinal: Denies nausea, vomiting, abdominal pain, diarrhea, constipation, blood in stool and abdominal distention.  Genitourinary: Denies dysuria, urgency, frequency, hematuria, flank pain and difficulty urinating.  Endocrine: Denies: hot or cold intolerance, sweats, changes in hair or nails, polyuria, polydipsia. Musculoskeletal: Denies myalgias, back pain, joint swelling, arthralgias and gait problem.  Skin: Denies pallor, rash and wound.  Neurological: Denies dizziness, seizures, syncope, weakness, light-headedness, numbness and headaches.  Hematological: Denies  adenopathy. Easy bruising, personal or family bleeding history  Psychiatric/Behavioral: Denies suicidal ideation, mood changes, confusion, nervousness, sleep disturbance and agitation    Physical Exam: Vitals:   04/11/18 1552  BP: 98/64  Pulse: 88  Temp: 98.7 F (37.1 C)  TempSrc: Oral  SpO2: 97%  Weight: 127 lb 11.2 oz (57.9 kg)  Height: 5\' 4"  (1.626 m)    Body mass index is 21.92 kg/m.   Constitutional: NAD, calm, comfortable Eyes: PERRL, lids and conjunctivae normal ENMT: Mucous membranes are moist. Respiratory: clear to auscultation bilaterally, no wheezing, no crackles. Normal respiratory effort. No accessory muscle use.  Cardiovascular: Regular rate and rhythm, no murmurs / rubs / gallops. No extremity edema. 2+ pedal pulses. No carotid bruits.  Musculoskeletal: no clubbing / cyanosis. No joint deformity upper and lower extremities. Good ROM, no contractures. Normal muscle tone.  Psychiatric: Normal judgment and insight. Alert and oriented x 3. Normal mood.    Impression and Plan:  Other migraine without status migrainosus, not intractable MENOPAUSE, EARLY Chronic insomnia Cervical dysplasia Lump or mass in breast  She takes no medications and is relatively healthy. She follows with GYN for her cervical and breast dysplasia (has an appointment next month). We have discussed shingles vaccine. She will make an appointment to return for her yearly physical    Patient Instructions  Great seeing you this afternoon.  Instructions: -follow up at your convenience for annual physical, please come in fasting to draw labs that day.     Lelon Frohlich, MD [facility]

## 2018-04-11 NOTE — Patient Instructions (Addendum)
Great seeing you this afternoon.  Instructions: -follow up at your convenience for annual physical, please come in fasting to draw labs that day.

## 2018-04-11 NOTE — Progress Notes (Signed)
Entered in error

## 2018-04-20 ENCOUNTER — Ambulatory Visit (INDEPENDENT_AMBULATORY_CARE_PROVIDER_SITE_OTHER): Payer: BC Managed Care – PPO | Admitting: Psychology

## 2018-04-20 ENCOUNTER — Ambulatory Visit: Payer: BC Managed Care – PPO | Admitting: Psychology

## 2018-04-20 DIAGNOSIS — F411 Generalized anxiety disorder: Secondary | ICD-10-CM

## 2018-04-25 ENCOUNTER — Encounter: Payer: Self-pay | Admitting: Internal Medicine

## 2018-04-26 ENCOUNTER — Ambulatory Visit (INDEPENDENT_AMBULATORY_CARE_PROVIDER_SITE_OTHER): Payer: BC Managed Care – PPO | Admitting: Psychology

## 2018-04-26 DIAGNOSIS — F411 Generalized anxiety disorder: Secondary | ICD-10-CM | POA: Diagnosis not present

## 2018-04-27 ENCOUNTER — Encounter: Payer: BC Managed Care – PPO | Admitting: Internal Medicine

## 2018-05-04 ENCOUNTER — Ambulatory Visit (INDEPENDENT_AMBULATORY_CARE_PROVIDER_SITE_OTHER): Payer: BC Managed Care – PPO | Admitting: Psychology

## 2018-05-04 DIAGNOSIS — F411 Generalized anxiety disorder: Secondary | ICD-10-CM

## 2018-05-11 ENCOUNTER — Ambulatory Visit (INDEPENDENT_AMBULATORY_CARE_PROVIDER_SITE_OTHER): Payer: BC Managed Care – PPO | Admitting: Psychology

## 2018-05-11 DIAGNOSIS — F411 Generalized anxiety disorder: Secondary | ICD-10-CM | POA: Diagnosis not present

## 2018-05-17 ENCOUNTER — Encounter: Payer: BC Managed Care – PPO | Admitting: Gynecology

## 2018-05-25 ENCOUNTER — Ambulatory Visit (INDEPENDENT_AMBULATORY_CARE_PROVIDER_SITE_OTHER): Payer: BC Managed Care – PPO | Admitting: Psychology

## 2018-05-25 DIAGNOSIS — F411 Generalized anxiety disorder: Secondary | ICD-10-CM

## 2018-06-08 ENCOUNTER — Ambulatory Visit: Payer: BC Managed Care – PPO | Admitting: Psychology

## 2018-06-18 ENCOUNTER — Other Ambulatory Visit: Payer: Self-pay

## 2018-06-20 ENCOUNTER — Ambulatory Visit: Payer: BC Managed Care – PPO | Admitting: Gynecology

## 2018-06-20 ENCOUNTER — Encounter: Payer: Self-pay | Admitting: Gynecology

## 2018-06-20 ENCOUNTER — Encounter: Payer: BC Managed Care – PPO | Admitting: Gynecology

## 2018-06-20 ENCOUNTER — Other Ambulatory Visit: Payer: Self-pay

## 2018-06-20 VITALS — BP 118/78 | Ht 65.0 in | Wt 129.0 lb

## 2018-06-20 DIAGNOSIS — Z01419 Encounter for gynecological examination (general) (routine) without abnormal findings: Secondary | ICD-10-CM | POA: Diagnosis not present

## 2018-06-20 DIAGNOSIS — N952 Postmenopausal atrophic vaginitis: Secondary | ICD-10-CM | POA: Diagnosis not present

## 2018-06-20 NOTE — Patient Instructions (Signed)
Follow-up in 1 year for annual exam, sooner as needed. 

## 2018-06-20 NOTE — Progress Notes (Signed)
    Cathe Bilger 1960/10/14 [id number]        58 y.o.  [id number] for annual gynecologic exam.  Was having issues with hot flushes and night sweats last year but these seem to have resolved.  Past medical history,surgical history, problem list, medications, allergies, family history and social history were all reviewed and documented as reviewed in the EPIC chart.  ROS:  Performed with pertinent positives and negatives included in the history, assessment and plan.   Additional significant findings : None   Exam: [name] assistant Vitals:   06/20/18 1201  BP: 118/78  Weight: 129 lb (58.5 kg)  Height: 5\' 5"  (1.651 m)   Body mass index is 21.47 kg/m.  General appearance:  Normal affect, orientation and appearance. Skin: Grossly normal HEENT: Without gross lesions.  No cervical or supraclavicular adenopathy. Thyroid normal.  Lungs:  Clear without wheezing, rales or rhonchi Cardiac: RR, without RMG Abdominal:  Soft, nontender, without masses, guarding, rebound, organomegaly or hernia Breasts:  Examined lying and sitting without masses, retractions, discharge or axillary adenopathy. Pelvic:  Ext, BUS, Vagina: With atrophic changes  Cervix: With atrophic changes  Uterus: Retroverted, normal size, shape and contour, midline and mobile nontender   Adnexa: Without masses or tenderness    Anus and perineum: Normal   Rectovaginal: Normal sphincter tone without palpated masses or tenderness.    Assessment/Plan:  58 y.o. [id number] female for annual gynecologic exam.   1. Postmenopausal.  No significant menopausal symptoms or any vaginal bleeding.  Knows to call if any vaginal bleeding. 2. Mammography 12/2017.  Had breast biopsy that showed hyperplasia with atypia.  She was told she had a 30% lifetime risk of breast cancer.  Had done MRI and was planning to do this yearly but apparently does not want to do this and is doing it at a less frequent screening intervals through her other  physician's office.  She will continue to follow-up with them in reference to breast screening and understands the risks of less frequent screening.  Breast exam normal today.  We will continue with annual mammography is when due. 3. Pap smear/HPV 05/2017.  No Pap smear done today.  History of cryosurgery in her 83s with normal Pap smears since. 4. DEXA 2010 reported normal.  Recommend repeat DEXA at age 9. 43. Colonoscopy 2019.  Repeat at their recommended interval. 6. Health maintenance.  No routine lab work done as patient does this elsewhere.  Follow-up 1 year, sooner as needed.   [name] MD, 12:32 PM 06/20/2018

## 2018-06-22 ENCOUNTER — Ambulatory Visit (INDEPENDENT_AMBULATORY_CARE_PROVIDER_SITE_OTHER): Payer: BC Managed Care – PPO | Admitting: Psychology

## 2018-06-22 DIAGNOSIS — F411 Generalized anxiety disorder: Secondary | ICD-10-CM | POA: Diagnosis not present

## 2018-07-13 ENCOUNTER — Other Ambulatory Visit: Payer: Self-pay

## 2018-07-13 ENCOUNTER — Ambulatory Visit: Payer: Self-pay

## 2018-07-13 ENCOUNTER — Encounter: Payer: Self-pay | Admitting: Internal Medicine

## 2018-07-13 ENCOUNTER — Other Ambulatory Visit (INDEPENDENT_AMBULATORY_CARE_PROVIDER_SITE_OTHER): Payer: BC Managed Care – PPO

## 2018-07-13 ENCOUNTER — Ambulatory Visit (INDEPENDENT_AMBULATORY_CARE_PROVIDER_SITE_OTHER): Payer: BC Managed Care – PPO | Admitting: Internal Medicine

## 2018-07-13 VITALS — BP 98/64 | HR 77 | Temp 98.1°F | Ht 66.0 in | Wt 127.3 lb

## 2018-07-13 DIAGNOSIS — R5383 Other fatigue: Secondary | ICD-10-CM

## 2018-07-13 DIAGNOSIS — H6123 Impacted cerumen, bilateral: Secondary | ICD-10-CM | POA: Diagnosis not present

## 2018-07-13 DIAGNOSIS — Z860100 Personal history of colon polyps, unspecified: Secondary | ICD-10-CM

## 2018-07-13 DIAGNOSIS — Z0000 Encounter for general adult medical examination without abnormal findings: Secondary | ICD-10-CM

## 2018-07-13 DIAGNOSIS — N63 Unspecified lump in unspecified breast: Secondary | ICD-10-CM | POA: Diagnosis not present

## 2018-07-13 DIAGNOSIS — L719 Rosacea, unspecified: Secondary | ICD-10-CM

## 2018-07-13 DIAGNOSIS — Z23 Encounter for immunization: Secondary | ICD-10-CM | POA: Diagnosis not present

## 2018-07-13 LAB — LIPID PANEL
Cholesterol: 213 mg/dL — ABNORMAL HIGH (ref 0–200)
HDL: 47.4 mg/dL (ref >39.00)
LDL Cholesterol: 145 mg/dL — ABNORMAL HIGH (ref 0–99)
NonHDL: 165.77
Total CHOL/HDL Ratio: 4
Triglycerides: 104 mg/dL (ref 0.0–149.0)
VLDL: 20.8 mg/dL (ref 0.0–40.0)

## 2018-07-13 LAB — COMPREHENSIVE METABOLIC PANEL
ALT: 11 U/L (ref 0–35)
AST: 18 U/L (ref 0–37)
Albumin: 4 g/dL (ref 3.5–5.2)
Alkaline Phosphatase: 55 U/L (ref 39–117)
BUN: 9 mg/dL (ref 6–23)
CO2: 28 mEq/L (ref 19–32)
Calcium: 9.3 mg/dL (ref 8.4–10.5)
Chloride: 104 mEq/L (ref 96–112)
Creatinine, Ser: 0.8 mg/dL (ref 0.40–1.20)
GFR: 73.87 mL/min (ref >60.00)
Glucose, Bld: 94 mg/dL (ref 70–99)
Potassium: 4.6 mEq/L (ref 3.5–5.1)
Sodium: 139 mEq/L (ref 135–145)
Total Bilirubin: 0.4 mg/dL (ref 0.2–1.2)
Total Protein: 6.8 g/dL (ref 6.0–8.3)

## 2018-07-13 LAB — TSH: TSH: 1.64 u[IU]/mL (ref 0.35–4.50)

## 2018-07-13 LAB — CBC WITH DIFFERENTIAL/PLATELET
Basophils Absolute: 0 10*3/uL (ref 0.0–0.1)
Basophils Relative: 0.7 % (ref 0.0–3.0)
Eosinophils Absolute: 0.3 10*3/uL (ref 0.0–0.7)
Eosinophils Relative: 4.9 % (ref 0.0–5.0)
HCT: 39.9 % (ref 36.0–46.0)
Hemoglobin: 13.2 g/dL (ref 12.0–15.0)
Lymphocytes Relative: 35 % (ref 12.0–46.0)
Lymphs Abs: 1.8 10*3/uL (ref 0.7–4.0)
MCHC: 33.2 g/dL (ref 30.0–36.0)
MCV: 94.3 fl (ref 78.0–100.0)
Monocytes Absolute: 0.6 10*3/uL (ref 0.1–1.0)
Monocytes Relative: 12.3 % — ABNORMAL HIGH (ref 3.0–12.0)
Neutro Abs: 2.5 10*3/uL (ref 1.4–7.7)
Neutrophils Relative %: 47.1 % (ref 43.0–77.0)
Platelets: 241 10*3/uL (ref 150.0–400.0)
RBC: 4.23 Mil/uL (ref 3.87–5.11)
RDW: 13.2 % (ref 11.5–15.5)
WBC: 5.2 10*3/uL (ref 4.0–10.5)

## 2018-07-13 LAB — HEMOGLOBIN A1C: Hgb A1c MFr Bld: 5.9 % (ref 4.6–6.5)

## 2018-07-13 LAB — VITAMIN B12: Vitamin B-12: 1000 pg/mL — ABNORMAL HIGH (ref 211–911)

## 2018-07-13 LAB — VITAMIN D 25 HYDROXY (VIT D DEFICIENCY, FRACTURES): VITD: 29.79 ng/mL — ABNORMAL LOW (ref 30.00–100.00)

## 2018-07-13 MED ORDER — CLINDAMYCIN PHOSPHATE 1 % EX SOLN: Freq: Two times a day (BID) | CUTANEOUS | 5 refills | Status: AC

## 2018-07-13 MED ORDER — TRETINOIN 0.05 % EX CREA: TOPICAL_CREAM | Freq: Every day | CUTANEOUS | 5 refills | Status: AC

## 2018-07-13 NOTE — Addendum Note (Signed)
Addended by: Elmer Picker on: 07/13/2018 10:09 AM   Modules accepted: Orders

## 2018-07-13 NOTE — Addendum Note (Signed)
Addended by: Gwynne Edinger on: 07/13/2018 09:42 AM   Modules accepted: Orders

## 2018-07-13 NOTE — Addendum Note (Signed)
Addended by: Westley Hummer B on: 07/13/2018 10:14 AM   Modules accepted: Orders

## 2018-07-13 NOTE — Patient Instructions (Signed)
-Nice seeing you today!!  -Lab work today; will notify you when results are available.  -First shingles immunization today; need to return for second in 2-6 months, no more than 6 months.   Preventive Care 40-64 Years, Female Preventive care refers to lifestyle choices and visits with your health care provider that can promote health and wellness. What does preventive care include?   A yearly physical exam. This is also called an annual well check.  Dental exams once or twice a year.  Routine eye exams. Ask your health care provider how often you should have your eyes checked.  Personal lifestyle choices, including: ? Daily care of your teeth and gums. ? Regular physical activity. ? Eating a healthy diet. ? Avoiding tobacco and drug use. ? Limiting alcohol use. ? Practicing safe sex. ? Taking low-dose aspirin daily starting at age 18. ? Taking vitamin and mineral supplements as recommended by your health care provider. What happens during an annual well check? The services and screenings done by your health care provider during your annual well check will depend on your age, overall health, lifestyle risk factors, and family history of disease. Counseling Your health care provider may ask you questions about your:  Alcohol use.  Tobacco use.  Drug use.  Emotional well-being.  Home and relationship well-being.  Sexual activity.  Eating habits.  Work and work Statistician.  Method of birth control.  Menstrual cycle.  Pregnancy history. Screening You may have the following tests or measurements:  Height, weight, and BMI.  Blood pressure.  Lipid and cholesterol levels. These may be checked every 5 years, or more frequently if you are over 56 years old.  Skin check.  Lung cancer screening. You may have this screening every year starting at age 23 if you have a 30-pack-year history of smoking and currently smoke or have quit within the past 15 years.   Colorectal cancer screening. All adults should have this screening starting at age 41 and continuing until age 39. Your health care provider may recommend screening at age 60. You will have tests every 1-10 years, depending on your results and the type of screening test. People at increased risk should start screening at an earlier age. Screening tests may include: ? Guaiac-based fecal occult blood testing. ? Fecal immunochemical test (FIT). ? Stool DNA test. ? Virtual colonoscopy. ? Sigmoidoscopy. During this test, a flexible tube with a tiny camera (sigmoidoscope) is used to examine your rectum and lower colon. The sigmoidoscope is inserted through your anus into your rectum and lower colon. ? Colonoscopy. During this test, a long, thin, flexible tube with a tiny camera (colonoscope) is used to examine your entire colon and rectum.  Hepatitis C blood test.  Hepatitis B blood test.  Sexually transmitted disease (STD) testing.  Diabetes screening. This is done by checking your blood sugar (glucose) after you have not eaten for a while (fasting). You may have this done every 1-3 years.  Mammogram. This may be done every 1-2 years. Talk to your health care provider about when you should start having regular mammograms. This may depend on whether you have a family history of breast cancer.  BRCA-related cancer screening. This may be done if you have a family history of breast, ovarian, tubal, or peritoneal cancers.  Pelvic exam and Pap test. This may be done every 3 years starting at age 8. Starting at age 57, this may be done every 5 years if you have a Pap test in  combination with an HPV test.  Bone density scan. This is done to screen for osteoporosis. You may have this scan if you are at high risk for osteoporosis. Discuss your test results, treatment options, and if necessary, the need for more tests with your health care provider. Vaccines Your health care provider may recommend certain  vaccines, such as:  Influenza vaccine. This is recommended every year.  Tetanus, diphtheria, and acellular pertussis (Tdap, Td) vaccine. You may need a Td booster every 10 years.  Varicella vaccine. You may need this if you have not been vaccinated.  Zoster vaccine. You may need this after age 37.  Measles, mumps, and rubella (MMR) vaccine. You may need at least one dose of MMR if you were born in 1957 or later. You may also need a second dose.  Pneumococcal 13-valent conjugate (PCV13) vaccine. You may need this if you have certain conditions and were not previously vaccinated.  Pneumococcal polysaccharide (PPSV23) vaccine. You may need one or two doses if you smoke cigarettes or if you have certain conditions.  Meningococcal vaccine. You may need this if you have certain conditions.  Hepatitis A vaccine. You may need this if you have certain conditions or if you travel or work in places where you may be exposed to hepatitis A.  Hepatitis B vaccine. You may need this if you have certain conditions or if you travel or work in places where you may be exposed to hepatitis B.  Haemophilus influenzae type b (Hib) vaccine. You may need this if you have certain conditions. Talk to your health care provider about which screenings and vaccines you need and how often you need them. This information is not intended to replace advice given to you by your health care provider. Make sure you discuss any questions you have with your health care provider. Document Released: 02/13/2015 Document Revised: 03/09/2017 Document Reviewed: 11/18/2014 Elsevier Interactive Patient Education  2019 [employer].

## 2018-07-13 NOTE — Addendum Note (Signed)
Addended by: Elmer Picker on: 07/13/2018 10:07 AM   Modules accepted: Orders

## 2018-07-13 NOTE — Addendum Note (Signed)
Addended by: Denna Haggard K on: 07/13/2018 10:30 AM   Modules accepted: Orders

## 2018-07-13 NOTE — Addendum Note (Signed)
Addended by: Gwynne Edinger on: 07/13/2018 10:00 AM   Modules accepted: Orders

## 2018-07-13 NOTE — Addendum Note (Signed)
Addended by: Gwynne Edinger on: 07/13/2018 09:41 AM   Modules accepted: Orders

## 2018-07-13 NOTE — Addendum Note (Signed)
Addended by: Gwynne Edinger on: 07/13/2018 11:06 AM   Modules accepted: Orders

## 2018-07-13 NOTE — Telephone Encounter (Signed)
Incoming call from Patient stating that she hand shingles injection   Today complains of soreness and aching at injection site .  Wants to know if Dr.  Jerilee Hoh would advise if it is alright if she may go swimming.  Provided care advise   [place] Female, 58 y.o., Aug 16, 1960 MRN:  [id number] Phone:  [phone] (H) PCP:  Isaac Bliss, Rayford Halsted, MD Primary Cvg:  Sherre Poot [employer] Message from [place] sent at 07/13/2018 3:26 PM EDT  Summary: injection site soreness    Patient received shingrix injection today and is feeling sore around injection site. Patient is requesting a call back to see if that is a normal reaction and she would also like to know if it would be safe to swim. Please advise.         Call History   Type Contact  07/13/2018 03:26 PM EDT Phone (Incoming) Jeronimo Norma (Self)  Phone: [phone] (H)  User: Sheran Luz    Reason for Disposition . Injection site reaction to any vaccine  Answer Assessment - Initial Assessment Questions 1. SYMPTOMS: "What is the main symptom?" (e.g., redness, swelling, pain)      2. ONSET: "When was the vaccine (shot) given?" "How much later did the *yes_ begin?" (e.g., hours, days ago)     achy 3. SEVERITY: "How bad is it?"      Just achcy 4. FEVER: "Is there a fever?" If so, ask: "What is it, how was it measured, and when did it start?"      denies 5. IMMUNIZATIONS GIVEN: "What shots have you recently received?"     *No Answer* 6. PAST REACTIONS: "Have you reacted to immunizations before?" If so, ask: "What happened?"    Denies 7. OTHER SYMPTOMS: "Do you have any other symptoms?"    denies  Protocols used: IMMUNIZATION REACTIONS-A-AH

## 2018-07-13 NOTE — Addendum Note (Signed)
Addended by: Gwynne Edinger on: 07/13/2018 09:23 AM   Modules accepted: Orders

## 2018-07-13 NOTE — Progress Notes (Signed)
Established Patient Office Visit     CC/Reason for Visit: Annual preventive exam  HPI: Regina Mendez is a 58 y.o. female who is coming in today for the above mentioned reasons. Past Medical History is significant for: Previous history of cervical dysplasia with a normal Pap smear in April 2019 followed by GYN, Dr. Phineas Real, breast lump with dysplasia for which she has been recommended to have a yearly breast MRI however she prefers to do this every other year due to concerns of radiation and gadolinium toxicity, she states she did have her mammogram in April however I do not have these results on file.  She has occasional seasonal allergies.  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  She does complain of being tired and fatigued.  She does not smoke, she drinks alcohol only occasionally, she has routine eye and dental care.  She is willing to receive shingles vaccination today.   Past Medical/Surgical History: Past Medical History:  Diagnosis Date  . Acne   . Breast cyst   . Migraine headache     Past Surgical History:  Procedure Laterality Date  . APPENDECTOMY    . BREAST SURGERY     right breast bx  . BREAST SURGERY     Right lumpectomy -dysplastic cells-2018  . COLPOSCOPY    . GYNECOLOGIC CRYOSURGERY  age 55  . OVARIAN CYST SURGERY    . TONSILLECTOMY      Social History:  reports that she has never smoked. She has never used smokeless tobacco. She reports current alcohol use of about 1.0 standard drinks of alcohol per week. She reports that she does not use drugs.  Allergies: Allergies  Allergen Reactions  . Codeine Other (See Comments)    Drowsy  . Sulfonamide Derivatives Dermatitis    Family History:  Family History  Problem Relation Age of Onset  . Thyroid cancer Mother   . Hypertension Father   . Heart disease Father   . Breast cancer Cousin        Mat. 1st cousins X 2     Age 80's     Current Outpatient Medications:  .   albuterol (PROVENTIL HFA;VENTOLIN HFA) 108 (90 Base) MCG/ACT inhaler, Inhale 2 puffs into the lungs every 6 (six) hours as needed., Disp: 18 g, Rfl: 0 .  beclomethasone (QVAR) 40 MCG/ACT inhaler, Inhale 2 puffs into the lungs 2 (two) times daily., Disp: 1 Inhaler, Rfl: 3 .  BIOTIN PO, Take by mouth., Disp: , Rfl:  .  clindamycin (CLEOCIN-T) 1 % external solution, Apply topically 2 (two) times daily., Disp: 30 mL, Rfl: 5 .  meloxicam (MOBIC) 7.5 MG tablet, Take 1 tablet (7.5 mg total) by mouth daily., Disp: 30 tablet, Rfl: 6 .  Multiple Vitamins-Minerals (HAIR/SKIN/NAILS PO), Take by mouth., Disp: , Rfl:  .  tretinoin (RETIN-A) 0.05 % cream, Apply topically at bedtime., Disp: 45 g, Rfl: 5 .  venlafaxine XR (EFFEXOR-XR) 37.5 MG 24 hr capsule, Take 1 capsule (37.5 mg total) by mouth daily with breakfast., Disp: 30 capsule, Rfl: 2  Review of Systems:  Constitutional: Denies fever, chills, diaphoresis, appetite change. HEENT: Denies photophobia, eye pain, redness, hearing loss, ear pain, congestion, sore throat, rhinorrhea, sneezing, mouth sores, trouble swallowing, neck pain, neck stiffness and tinnitus.   Respiratory: Denies SOB, DOE, cough, chest tightness,  and wheezing.   Cardiovascular: Denies chest pain, palpitations and leg swelling.  Gastrointestinal: Denies nausea, vomiting, abdominal pain, diarrhea, constipation,  blood in stool and abdominal distention.  Genitourinary: Denies dysuria, urgency, frequency, hematuria, flank pain and difficulty urinating.  Endocrine: Denies: hot or cold intolerance, sweats, changes in hair or nails, polyuria, polydipsia. Musculoskeletal: Denies myalgias, back pain, joint swelling, arthralgias and gait problem.  Skin: Denies pallor, rash and wound.  Neurological: Denies dizziness, seizures, syncope, weakness, light-headedness, numbness and headaches.  Hematological: Denies adenopathy. Easy bruising, personal or family bleeding history  Psychiatric/Behavioral:  Denies suicidal ideation, mood changes, confusion, nervousness, sleep disturbance and agitation    Physical Exam: Vitals:   07/13/18 0842  BP: 98/64  Pulse: 77  Temp: 98.1 F (36.7 C)  TempSrc: Oral  SpO2: 99%  Weight: 127 lb 4.8 oz (57.7 kg)  Height: 5' 6"  (1.676 m)    Body mass index is 20.55 kg/m.   Constitutional: NAD, calm, comfortable Eyes: PERRL, lids and conjunctivae normal ENMT: Mucous membranes are moist. Posterior pharynx clear of any exudate or lesions. Normal dentition. Tympanic membrane is obstructed with cerumen bilaterally. Neck: normal, supple, no masses, no thyromegaly Respiratory: clear to auscultation bilaterally, no wheezing, no crackles. Normal respiratory effort. No accessory muscle use.  Cardiovascular: Regular rate and rhythm, no murmurs / rubs / gallops. No extremity edema. 2+ pedal pulses. No carotid bruits.  Abdomen: no tenderness, no masses palpated. No hepatosplenomegaly. Bowel sounds positive.  Musculoskeletal: no clubbing / cyanosis. No joint deformity upper and lower extremities. Good ROM, no contractures. Normal muscle tone.  Skin: no rashes, lesions, ulcers. No induration Neurologic: CN 2-12 grossly intact. Sensation intact, DTR normal. Strength 5/5 in all 4.  Psychiatric: Normal judgment and insight. Alert and oriented x 3. Normal mood.    Impression and Plan:  Encounter for preventive health examination -She has routine eye and dental care already scheduled. -She will receive her first shingles immunization today, otherwise immunizations are up-to-date. -We have discussed healthy lifestyle, she is already swimming 2-3 times a week and in addition does yoga. -She had a colonoscopy in April 2019, repeat in 10 years. -Pap smears are done through her [place] office last was in April 2019 and was reported as normal. -She had breast cancer screening 2 months ago with a mammogram although I do not have these results.  She has been recommended yearly  MRIs due to breast biopsy results, however she prefers to do these every other year.  Impacted cerumen, bilateral Cerumen Desimpaction  Warm water was applied and gentle ear lavage performed on bilateral ears. There were no complications and following the desimpaction the tympanic membranes were visible. Tympanic membranes are intact following the procedure. Auditory canals are normal. The patient reported relief of symptoms after removal of cerumen.  History of colon polyps -Had normal screening colonoscopy in April 2019, due for repeat in 10 years.  Lump or mass in breast -She has been advised to have yearly MRIs due to dysplasia noted on breast biopsy, however she will do these every other years in addition to yearly screening mammograms.  Rosacea - Plan: clindamycin (CLEOCIN-T) 1 % external solution, tretinoin (RETIN-A) 0.05 % cream    Patient Instructions  -Nice seeing you today!!  -Lab work today; will notify you when results are available.  -First shingles immunization today; need to return for second in 2-6 months, no more than 6 months.   Preventive Care 40-64 Years, Female Preventive care refers to lifestyle choices and visits with your health care provider that can promote health and wellness. What does preventive care include?   A yearly physical exam.  This is also called an annual well check.  Dental exams once or twice a year.  Routine eye exams. Ask your health care provider how often you should have your eyes checked.  Personal lifestyle choices, including: ? Daily care of your teeth and gums. ? Regular physical activity. ? Eating a healthy diet. ? Avoiding tobacco and drug use. ? Limiting alcohol use. ? Practicing safe sex. ? Taking low-dose aspirin daily starting at age 70. ? Taking vitamin and mineral supplements as recommended by your health care provider. What happens during an annual well check? The services and screenings done by your health care  provider during your annual well check will depend on your age, overall health, lifestyle risk factors, and family history of disease. Counseling Your health care provider may ask you questions about your:  Alcohol use.  Tobacco use.  Drug use.  Emotional well-being.  Home and relationship well-being.  Sexual activity.  Eating habits.  Work and work Statistician.  Method of birth control.  Menstrual cycle.  Pregnancy history. Screening You may have the following tests or measurements:  Height, weight, and BMI.  Blood pressure.  Lipid and cholesterol levels. These may be checked every 5 years, or more frequently if you are over 17 years old.  Skin check.  Lung cancer screening. You may have this screening every year starting at age 18 if you have a 30-pack-year history of smoking and currently smoke or have quit within the past 15 years.  Colorectal cancer screening. All adults should have this screening starting at age 79 and continuing until age 12. Your health care provider may recommend screening at age 57. You will have tests every 1-10 years, depending on your results and the type of screening test. People at increased risk should start screening at an earlier age. Screening tests may include: ? Guaiac-based fecal occult blood testing. ? Fecal immunochemical test (FIT). ? Stool DNA test. ? Virtual colonoscopy. ? Sigmoidoscopy. During this test, a flexible tube with a tiny camera (sigmoidoscope) is used to examine your rectum and lower colon. The sigmoidoscope is inserted through your anus into your rectum and lower colon. ? Colonoscopy. During this test, a long, thin, flexible tube with a tiny camera (colonoscope) is used to examine your entire colon and rectum.  Hepatitis C blood test.  Hepatitis B blood test.  Sexually transmitted disease (STD) testing.  Diabetes screening. This is done by checking your blood sugar (glucose) after you have not eaten for a  while (fasting). You may have this done every 1-3 years.  Mammogram. This may be done every 1-2 years. Talk to your health care provider about when you should start having regular mammograms. This may depend on whether you have a family history of breast cancer.  BRCA-related cancer screening. This may be done if you have a family history of breast, ovarian, tubal, or peritoneal cancers.  Pelvic exam and Pap test. This may be done every 3 years starting at age 32. Starting at age 3, this may be done every 5 years if you have a Pap test in combination with an HPV test.  Bone density scan. This is done to screen for osteoporosis. You may have this scan if you are at high risk for osteoporosis. Discuss your test results, treatment options, and if necessary, the need for more tests with your health care provider. Vaccines Your health care provider may recommend certain vaccines, such as:  Influenza vaccine. This is recommended every year.  Tetanus,  diphtheria, and acellular pertussis (Tdap, Td) vaccine. You may need a Td booster every 10 years.  Varicella vaccine. You may need this if you have not been vaccinated.  Zoster vaccine. You may need this after age 89.  Measles, mumps, and rubella (MMR) vaccine. You may need at least one dose of MMR if you were born in 1957 or later. You may also need a second dose.  Pneumococcal 13-valent conjugate (PCV13) vaccine. You may need this if you have certain conditions and were not previously vaccinated.  Pneumococcal polysaccharide (PPSV23) vaccine. You may need one or two doses if you smoke cigarettes or if you have certain conditions.  Meningococcal vaccine. You may need this if you have certain conditions.  Hepatitis A vaccine. You may need this if you have certain conditions or if you travel or work in places where you may be exposed to hepatitis A.  Hepatitis B vaccine. You may need this if you have certain conditions or if you travel or work  in places where you may be exposed to hepatitis B.  Haemophilus influenzae type b (Hib) vaccine. You may need this if you have certain conditions. Talk to your health care provider about which screenings and vaccines you need and how often you need them. This information is not intended to replace advice given to you by your health care provider. Make sure you discuss any questions you have with your health care provider. Document Released: 02/13/2015 Document Revised: 03/09/2017 Document Reviewed: 11/18/2014 Elsevier Interactive Patient Education  2019 [place], [place] [facility]

## 2018-07-17 ENCOUNTER — Encounter: Payer: Self-pay | Admitting: Internal Medicine

## 2018-07-17 ENCOUNTER — Other Ambulatory Visit: Payer: Self-pay | Admitting: Internal Medicine

## 2018-07-17 DIAGNOSIS — E559 Vitamin D deficiency, unspecified: Secondary | ICD-10-CM

## 2018-07-17 MED ORDER — VITAMIN D (ERGOCALCIFEROL) 1.25 MG (50000 UNIT) PO CAPS: 50000.0000 [IU] | ORAL_CAPSULE | ORAL | 0 refills | Status: AC

## 2018-07-18 NOTE — Telephone Encounter (Signed)
Left message on machine for patient returning her call.   Okay for patient to swim. CRM

## 2018-07-19 ENCOUNTER — Other Ambulatory Visit: Payer: Self-pay | Admitting: Internal Medicine

## 2018-07-19 DIAGNOSIS — E559 Vitamin D deficiency, unspecified: Secondary | ICD-10-CM

## 2018-07-20 NOTE — Telephone Encounter (Signed)
Spoke with patient and she is "much better".  She will call back to schedule her 2nd shingles vaccine.

## 2018-08-03 ENCOUNTER — Ambulatory Visit (INDEPENDENT_AMBULATORY_CARE_PROVIDER_SITE_OTHER): Payer: BC Managed Care – PPO | Admitting: Psychology

## 2018-08-03 DIAGNOSIS — F411 Generalized anxiety disorder: Secondary | ICD-10-CM | POA: Diagnosis not present

## 2018-09-21 ENCOUNTER — Ambulatory Visit (INDEPENDENT_AMBULATORY_CARE_PROVIDER_SITE_OTHER): Payer: BC Managed Care – PPO | Admitting: Psychology

## 2018-09-21 DIAGNOSIS — F411 Generalized anxiety disorder: Secondary | ICD-10-CM | POA: Diagnosis not present

## 2018-09-27 ENCOUNTER — Telehealth (INDEPENDENT_AMBULATORY_CARE_PROVIDER_SITE_OTHER): Payer: BC Managed Care – PPO | Admitting: Family Medicine

## 2018-09-27 ENCOUNTER — Other Ambulatory Visit: Payer: Self-pay

## 2018-09-27 DIAGNOSIS — R0981 Nasal congestion: Secondary | ICD-10-CM

## 2018-09-27 DIAGNOSIS — R6889 Other general symptoms and signs: Secondary | ICD-10-CM | POA: Diagnosis not present

## 2018-09-27 DIAGNOSIS — R059 Cough, unspecified: Secondary | ICD-10-CM

## 2018-09-27 DIAGNOSIS — Z20822 Contact with and (suspected) exposure to covid-19: Secondary | ICD-10-CM

## 2018-09-27 DIAGNOSIS — R05 Cough: Secondary | ICD-10-CM

## 2018-09-27 NOTE — Progress Notes (Signed)
Virtual Visit via Video Note  I connected with Regina Mendez  on 09/27/18 at  5:40 PM EDT by a video enabled telemedicine application and verified that I am speaking with the correct person using two identifiers.  Location patient: home Location provider:work or home office Persons participating in the virtual visit: patient, provider  I discussed the limitations of evaluation and management by telemedicine and the availability of in person appointments. The patient expressed understanding and agreed to proceed.   HPI:  Acute visit for Sinus issues: -symptoms started yesterday -nasal congestion, PND, some fatigue, sore throat, mild HA, mild cough - she has allergies, so she is not sure if that is what is going on -she rarely leaves the home, but she went shopping over the weekend and went to the eye doctor - at the store people were not following the distancing guidelines; she is doing outdoor classes at [employer] and does [facility] [employer] -she was worried if this could be COVID19 -no fever, SOB, body aches, diarrhea, loss of taste or smell -works from home, college professor   ROS: See pertinent positives and negatives per HPI.  Past Medical History:  Diagnosis Date  . Acne   . Breast cyst   . Migraine headache     Past Surgical History:  Procedure Laterality Date  . APPENDECTOMY    . BREAST SURGERY     right breast bx  . BREAST SURGERY     Right lumpectomy -dysplastic cells-2018  . COLPOSCOPY    . GYNECOLOGIC CRYOSURGERY  age 46  . OVARIAN CYST SURGERY    . TONSILLECTOMY      Family History  Problem Relation Age of Onset  . Thyroid cancer Mother   . Hypertension Father   . Heart disease Father   . Breast cancer Cousin        Mat. 1st cousins X 2     Age 90's    SOCIAL HX: see hpi   Current Outpatient Medications:  .  albuterol (PROVENTIL HFA;VENTOLIN HFA) 108 (90 Base) MCG/ACT inhaler, Inhale 2 puffs into the lungs every 6 (six) hours as needed., Disp: 18 g, Rfl: 0 .   beclomethasone (QVAR) 40 MCG/ACT inhaler, Inhale 2 puffs into the lungs 2 (two) times daily., Disp: 1 Inhaler, Rfl: 3 .  BIOTIN PO, Take by mouth., Disp: , Rfl:  .  clindamycin (CLEOCIN-T) 1 % external solution, Apply topically 2 (two) times daily., Disp: 30 mL, Rfl: 5 .  meloxicam (MOBIC) 7.5 MG tablet, Take 1 tablet (7.5 mg total) by mouth daily., Disp: 30 tablet, Rfl: 6 .  Multiple Vitamins-Minerals (HAIR/SKIN/NAILS PO), Take by mouth., Disp: , Rfl:  .  tretinoin (RETIN-A) 0.05 % cream, Apply topically at bedtime., Disp: 45 g, Rfl: 5 .  venlafaxine XR (EFFEXOR-XR) 37.5 MG 24 hr capsule, Take 1 capsule (37.5 mg total) by mouth daily with breakfast., Disp: 30 capsule, Rfl: 2 .  Vitamin D, Ergocalciferol, (DRISDOL) 1.25 MG (50000 UT) CAPS capsule, Take 1 capsule (50,000 Units total) by mouth every 7 (seven) days for 12 doses., Disp: 12 capsule, Rfl: 0  EXAM:  VITALS per patient if applicable:denies fever  GENERAL: alert, oriented, appears well and in no acute distress  HEENT: atraumatic, conjunttiva clear, no obvious abnormalities on inspection of external nose and ears  NECK: normal movements of the head and neck  LUNGS: on inspection no signs of respiratory distress, breathing rate appears normal, no obvious gross SOB, gasping or wheezing  CV: no obvious  cyanosis  MS: moves all visible extremities without noticeable abnormality  PSYCH/NEURO: pleasant and cooperative, no obvious depression or anxiety, speech and thought processing grossly intact  ASSESSMENT AND PLAN:  Discussed the following assessment and plan:  Sinus congestion  Cough  Suspected Covid-19 Virus Infection - Plan: Novel Coronavirus, NAA ([facility])  -we discussed possible serious and likely etiologies, workup and treatment, treatment risks and return precautions. Suspect allergic rhinitis, vs viral URI vs COVID19. Discussed options, timing, limitations of COVID19 testing. Discussed home isolation  guidelines. -after this discussion, Regina Mendez opted for symptomatic care, COVID19 testing, home isolation -follow up advised as needed -of course, we advised Regina Mendez  to return or notify a doctor immediately if symptoms worsen or persist or new concerns arise. I discussed the assessment and treatment plan with the patient. The patient was provided an opportunity to ask questions and all were answered. The patient agreed with the plan and demonstrated an understanding of the instructions.    Regina Kern, DO   Patient Instructions  Follow up: as needed, please call if worsening, new concerns, positive test, questions or if you are not improving over the next 5-7 days.  Self Isolation/Home Quarantine: -see the CDC site for information:   [email or web address].za.html   -STAY HOME except for to seek medical care -stay in your own room away from others in your house and use a separate bathroom if possible -Wash hands frequently, wear a mask if you leave your room and interact as little as possible with others -seek medical care immediately if worsening - call our office for a visit or call ahead if going elsewhere to an urgent care  -seek emergency care if very sick or severe symptoms - call 911 -isolate for at least 10 days from the onset of symptoms PLUS 3 days of no fever PLUS 3 days of improving symptoms    Novel Coronavirus Testing: I sent an order for coronavirus testing. No Appointment is needed.  Testing Sites:    [facility] Location:                            [place], [place] (old [facility]) Hours:                                 8a-3:45p, M-F  [facility] Location:                           [address], [place], [place] [address]                                                              [place] ([place]) Hours:                                 8a-3:45p,  M-F  [facility] Location:                            [place], [place] (across from [place]) Hours:                                 8a-3:45p, M-F  Positive test. These tests are not 100% perfect, but if you tested positive for COVID-19, this confirms that you have contracted the SARS-CoV-2 virus. STAY HOME to complete full Quarantine per CDC guidelines.  Negative test. These tests are not 100% perfect but if you tested negative for COVID-19, this indicates that you may not have contracted the SARS-CoV-2 virus. Follow your doctor's recommendations and the CDC guidelines.

## 2018-09-27 NOTE — Patient Instructions (Addendum)
Follow up: as needed, please call if worsening, new concerns, positive test, questions or if you are not improving over the next 5-7 days.  Self Isolation/Home Quarantine: -see the CDC site for information:   [email or web address].co.za.html   -STAY HOME except for to seek medical care -stay in your own room away from others in your house and use a separate bathroom if possible -Wash hands frequently, wear a mask if you leave your room and interact as little as possible with others -seek medical care immediately if worsening - call our office for a visit or call ahead if going elsewhere to an urgent care  -seek emergency care if very sick or severe symptoms - call 911 -isolate for at least 10 days from the onset of symptoms PLUS 3 days of no fever PLUS 3 days of improving symptoms    Novel Coronavirus Testing: I sent an order for coronavirus testing. No Appointment is needed.  Testing Sites:    [facility] Location:                            [address], [place] (old [facility]) Hours:                                 8a-3:45p, M-F  [facility] Location:                           [address], [place], [place] [address]                                                              [place] ([place]) Hours:                                 8a-3:45p, M-F  [facility] Location:                            [place], [place] (across from [place]) Hours:                                 8a-3:45p, M-F  Positive test. These tests are not 100% perfect, but if you tested positive for COVID-19, this confirms that you have contracted the SARS-CoV-2 virus. STAY HOME to complete full Quarantine per CDC guidelines.  Negative test. These tests are not 100% perfect but if you tested negative for COVID-19, this indicates that you may not  have contracted the SARS-CoV-2 virus. Follow your doctor's recommendations and the CDC guidelines.

## 2018-10-19 ENCOUNTER — Ambulatory Visit (INDEPENDENT_AMBULATORY_CARE_PROVIDER_SITE_OTHER): Payer: BC Managed Care – PPO | Admitting: Psychology

## 2018-10-19 DIAGNOSIS — F411 Generalized anxiety disorder: Secondary | ICD-10-CM | POA: Diagnosis not present

## 2018-10-26 ENCOUNTER — Other Ambulatory Visit: Payer: Self-pay

## 2018-10-29 ENCOUNTER — Other Ambulatory Visit: Payer: Self-pay

## 2018-10-30 ENCOUNTER — Encounter: Payer: Self-pay | Admitting: Gynecology

## 2018-11-02 ENCOUNTER — Other Ambulatory Visit: Payer: Self-pay

## 2018-11-12 ENCOUNTER — Other Ambulatory Visit: Payer: Self-pay

## 2018-11-12 ENCOUNTER — Ambulatory Visit (INDEPENDENT_AMBULATORY_CARE_PROVIDER_SITE_OTHER): Payer: BC Managed Care – PPO | Admitting: Internal Medicine

## 2018-11-12 ENCOUNTER — Ambulatory Visit (INDEPENDENT_AMBULATORY_CARE_PROVIDER_SITE_OTHER)
Admission: RE | Admit: 2018-11-12 | Discharge: 2018-11-12 | Disposition: A | Discharge status: Home / Self Care | Payer: BC Managed Care – PPO | Source: Ambulatory Visit | Attending: Internal Medicine | Admitting: Internal Medicine

## 2018-11-12 ENCOUNTER — Encounter: Payer: Self-pay | Admitting: Internal Medicine

## 2018-11-12 DIAGNOSIS — S299XXA Unspecified injury of thorax, initial encounter: Secondary | ICD-10-CM

## 2018-11-12 DIAGNOSIS — R0789 Other chest pain: Secondary | ICD-10-CM | POA: Diagnosis not present

## 2018-11-12 NOTE — Progress Notes (Signed)
Virtual Visit via Video Note  I connected with@ on 11/12/18 at 11:00 AM EDT by a video enabled telemedicine application and verified that I am speaking with the correct person using two identifiers. Location patient: home Location provider:work  office Persons participating in the virtual visit: patient, provider  [place] national recommendations  regarding COVID 19 pandemic   video visit is advised over in office visit for this patient.  Patient aware  of the limitations of evaluation and management by telemedicine and  availability of in person appointments. and agreed to proceed.   HPI: Regina Mendez presents for video visit PCP NA  About 1-1/2 weeks ago she sustained an injury to her left anterior lateral chest wall when a shovel fell and the handle hit against her chest.  She had severe pain but no other associated symptoms.  She used ice and ibuprofen has continued to try to stay active including her swimming.  However over the last few days her pain has felt to be worse no shortness of breath cough or fever but does hurt to move a certain way. Denies any specific bruising or disfiguration. She is concerned about this because it is getting worse instead of better.  ROS: See pertinent positives and negatives per HPI.  Past Medical History:  Diagnosis Date  . Acne   . Breast cyst   . Migraine headache     Past Surgical History:  Procedure Laterality Date  . APPENDECTOMY    . BREAST SURGERY     right breast bx  . BREAST SURGERY     Right lumpectomy -dysplastic cells-2018  . COLPOSCOPY    . GYNECOLOGIC CRYOSURGERY  age 75  . OVARIAN CYST SURGERY    . TONSILLECTOMY      Family History  Problem Relation Age of Onset  . Thyroid cancer Mother   . Hypertension Father   . Heart disease Father   . Breast cancer Cousin        Mat. 1st cousins X 2     Age 46's    Social History   Tobacco Use  . Smoking status: Never Smoker  . Smokeless tobacco: Never Used  Substance  Use Topics  . Alcohol use: Yes    Alcohol/week: 1.0 standard drinks    Types: 1 Standard drinks or equivalent per week  . Drug use: No      Current Outpatient Medications:  .  albuterol (PROVENTIL HFA;VENTOLIN HFA) 108 (90 Base) MCG/ACT inhaler, Inhale 2 puffs into the lungs every 6 (six) hours as needed., Disp: 18 g, Rfl: 0 .  beclomethasone (QVAR) 40 MCG/ACT inhaler, Inhale 2 puffs into the lungs 2 (two) times daily., Disp: 1 Inhaler, Rfl: 3 .  BIOTIN PO, Take by mouth., Disp: , Rfl:  .  clindamycin (CLEOCIN-T) 1 % external solution, Apply topically 2 (two) times daily., Disp: 30 mL, Rfl: 5 .  meloxicam (MOBIC) 7.5 MG tablet, Take 1 tablet (7.5 mg total) by mouth daily., Disp: 30 tablet, Rfl: 6 .  Multiple Vitamins-Minerals (HAIR/SKIN/NAILS PO), Take by mouth., Disp: , Rfl:  .  tretinoin (RETIN-A) 0.05 % cream, Apply topically at bedtime., Disp: 45 g, Rfl: 5 .  venlafaxine XR (EFFEXOR-XR) 37.5 MG 24 hr capsule, Take 1 capsule (37.5 mg total) by mouth daily with breakfast., Disp: 30 capsule, Rfl: 2  EXAM: BP Readings from Last 3 Encounters:  07/13/18 98/64  06/20/18 118/78  04/11/18 98/64    VITALS per patient if applicable:  GENERAL: alert, oriented, appears well  and in no acute distress  HEENT: atraumatic, conjunttiva clear, no obvious abnormalities on inspection of external nose and ears NECK: normal movements of the head and neck LUNGS: on inspection no signs of respiratory distress, breathing rate appears normal, no obvious gross SOB, gasping or wheezing Examination of chest wall she did give a good exam shows a very small resolving bruise in the left chest below the breast on the lateral clavicular line.  On her exam she is tender all over the area modestly tender on the lateral rib wall there is no deformity and her breathing appears normal.  She has full range of motion of her upper extremities. CV: no obvious cyanosis Abdominal wall appears clear without bruising. MS:  moves all visible extremities without noticeable abnormality  PSYCH/NEURO: pleasant and cooperative, no obvious depression or anxiety, speech and thought processing grossly intact Lab Results  Component Value Date   WBC 5.2 07/13/2018   HGB 13.2 07/13/2018   HCT 39.9 07/13/2018   PLT 241.0 07/13/2018   GLUCOSE 94 07/13/2018   CHOL 213 (H) 07/13/2018   TRIG 104.0 07/13/2018   HDL 47.40 07/13/2018   LDLDIRECT 113.6 10/06/2011   LDLCALC 145 (H) 07/13/2018   ALT 11 07/13/2018   AST 18 07/13/2018   NA 139 07/13/2018   K 4.6 07/13/2018   CL 104 07/13/2018   CREATININE 0.80 07/13/2018   BUN 9 07/13/2018   CO2 28 07/13/2018   TSH 1.64 07/13/2018   HGBA1C 5.9 07/13/2018    ASSESSMENT AND PLAN:  Discussed the following assessment and plan:    ICD-10-CM   1. Chest wall injury, initial encounter  S29.9XXA DG Chest 2 View    DG Ribs Unilateral Left  2. Left-sided chest wall pain  R07.89 DG Chest 2 View    DG Ribs Unilateral Left   Chest wall injury anterolateral discussed involvement of rib cartilage that may not be seen on x-ray but is certainly reasonable to get rib x-ray today for expectant management.  I do not see signs of respiratory distress and it is not an abdominal injury.  And no alarm signs otherwise. X-ray today expectant management may take 4 weeks to 6 weeks to feel normal. Can try topicals Voltaren gel or lidocaine patch if needed with caution. And follow-up if persistent progressive or new symptoms occur. Counseled.   Expectant management and discussion of plan and treatment with opportunity to ask questions and all were answered. The patient agreed with the plan and demonstrated an understanding of the instructions.   Advised to call back or seek an in-person evaluation if worsening  or having  further concerns .   Shanon Ace, MD

## 2018-11-13 ENCOUNTER — Other Ambulatory Visit (INDEPENDENT_AMBULATORY_CARE_PROVIDER_SITE_OTHER): Payer: BC Managed Care – PPO

## 2018-11-13 DIAGNOSIS — E559 Vitamin D deficiency, unspecified: Secondary | ICD-10-CM

## 2018-11-13 LAB — VITAMIN D 25 HYDROXY (VIT D DEFICIENCY, FRACTURES): VITD: 28.45 ng/mL — ABNORMAL LOW (ref 30.00–100.00)

## 2018-11-14 ENCOUNTER — Other Ambulatory Visit: Payer: Self-pay | Admitting: Internal Medicine

## 2018-11-14 DIAGNOSIS — E559 Vitamin D deficiency, unspecified: Secondary | ICD-10-CM

## 2018-11-14 MED ORDER — VITAMIN D (ERGOCALCIFEROL) 1.25 MG (50000 UNIT) PO CAPS: 50000.0000 [IU] | ORAL_CAPSULE | ORAL | 0 refills | Status: DC

## 2018-12-08 ENCOUNTER — Other Ambulatory Visit: Payer: Self-pay | Admitting: Internal Medicine

## 2018-12-08 DIAGNOSIS — E559 Vitamin D deficiency, unspecified: Secondary | ICD-10-CM

## 2018-12-12 ENCOUNTER — Telehealth: Payer: Self-pay | Admitting: Internal Medicine

## 2018-12-12 NOTE — Telephone Encounter (Signed)
Copied from [place] [id number]. Topic: General - Inquiry >> Dec 12, 2018  1:52 PM Alease Frame wrote: Reason for CRM: Patient would like a call back from Dr Jerilee Hoh. Please advise

## 2019-02-04 ENCOUNTER — Telehealth: Payer: Self-pay | Admitting: *Deleted

## 2019-02-04 DIAGNOSIS — M542 Cervicalgia: Secondary | ICD-10-CM
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
## 2019-02-05 NOTE — Addendum Note (Signed)
Addended by: Westley Hummer B on: 02/05/2019 04:53 PM   Modules accepted: Orders

## 2019-02-05 NOTE — Telephone Encounter (Signed)
Referral placed.

## 2019-02-05 NOTE — Telephone Encounter (Signed)
OK for sports med referral. I prefer Dr. Charlann Boxer.

## 2019-02-05 NOTE — Telephone Encounter (Signed)
Patient called to inform Dr Jerilee Hoh that she will be going to see an orthopedic specialist at [facility] a Dr Jamesetta Geralds on Thursday 02/06/2018 asking for a referral to be sent over Ph# [phone]

## 2019-03-19 ENCOUNTER — Telehealth: Payer: Self-pay | Admitting: Internal Medicine

## 2019-03-19 NOTE — Telephone Encounter (Signed)
Lab ordered and appointment scheduled. 

## 2019-03-19 NOTE — Telephone Encounter (Signed)
Pt states she still has the symptoms of Low vitamin D, dry eyes, dry lips and fatigue. She would like to be retested.   She has been taking the vitamin D for a while and still has those symptoms   Pt can reached at [phone]

## 2019-03-25 ENCOUNTER — Other Ambulatory Visit: Payer: Self-pay

## 2019-03-26 ENCOUNTER — Other Ambulatory Visit (INDEPENDENT_AMBULATORY_CARE_PROVIDER_SITE_OTHER): Payer: BC Managed Care – PPO

## 2019-03-26 DIAGNOSIS — E559 Vitamin D deficiency, unspecified: Secondary | ICD-10-CM | POA: Diagnosis not present

## 2019-03-26 LAB — VITAMIN D 25 HYDROXY (VIT D DEFICIENCY, FRACTURES): VITD: 56.85 ng/mL (ref 30.00–100.00)

## 2019-04-02 ENCOUNTER — Other Ambulatory Visit: Payer: Self-pay

## 2019-04-02 ENCOUNTER — Telehealth: Payer: Self-pay | Admitting: Internal Medicine

## 2019-04-02 ENCOUNTER — Telehealth (INDEPENDENT_AMBULATORY_CARE_PROVIDER_SITE_OTHER): Payer: BC Managed Care – PPO | Admitting: Family Medicine

## 2019-04-02 DIAGNOSIS — Z8349 Family history of other endocrine, nutritional and metabolic diseases: Secondary | ICD-10-CM | POA: Diagnosis not present

## 2019-04-02 DIAGNOSIS — F419 Anxiety disorder, unspecified: Secondary | ICD-10-CM

## 2019-04-02 DIAGNOSIS — R5383 Other fatigue: Secondary | ICD-10-CM

## 2019-04-02 NOTE — Addendum Note (Signed)
Addended by: Lucretia Kern on: 04/02/2019 11:38 AM   Modules accepted: Level of Service

## 2019-04-02 NOTE — Telephone Encounter (Signed)
-----   Message from Lucretia Kern, DO sent at 04/02/2019 11:37 AM EST ----- Needs follow up appt with PCP or me in 1-2 months thanks.

## 2019-04-02 NOTE — Telephone Encounter (Signed)
Left message with spouse to call back to set up

## 2019-04-02 NOTE — Patient Instructions (Signed)
Get the labs - you will need a lab appointment.  Continue counseling.  Follow up in 1-2 months, sooner if any worsening, new concerns or if you wish to consider a medication for stress/anxiety.

## 2019-04-02 NOTE — Progress Notes (Addendum)
Virtual Visit via Video Note  I connected with Regina Mendez  on 04/02/19 at 10:00 AM EST by a video enabled telemedicine application and verified that I am speaking with the correct person using two identifiers.  Location patient: home Location provider:work or home office Persons participating in the virtual visit: patient, provider  I discussed the limitations of evaluation and management by telemedicine and the availability of in person appointments. The patient expressed understanding and agreed to proceed.   HPI:  ? Vitamin D def: -reports last year had low energy, depressed mood, crying easy, dry eyes and found had low Vit D - with Vit D resolved -had the same symptoms start back recently over the winter and thought it was the Vit D -exercises, eats healthy -denies fevers, chills, weight loss, breathing issues. Cough, bleeding, bowel changes -she takes lots of supplements including estroven, collagen, biotin -she has had increased stress and feels overwhelmed, emotional at times, eats more from this and poor sleep -she sees a counselor for the anxiety and stress -denies thoughts of harm, sig depression, hallucinations -reports family history of thyroid disease  ROS: See pertinent positives and negatives per HPI.  Past Medical History:  Diagnosis Date  . Acne   . Breast cyst   . Migraine headache     Past Surgical History:  Procedure Laterality Date  . APPENDECTOMY    . BREAST SURGERY     right breast bx  . BREAST SURGERY     Right lumpectomy -dysplastic cells-2018  . COLPOSCOPY    . GYNECOLOGIC CRYOSURGERY  age 76  . OVARIAN CYST SURGERY    . TONSILLECTOMY      Family History  Problem Relation Age of Onset  . Thyroid cancer Mother   . Hypertension Father   . Heart disease Father   . Breast cancer Cousin        Mat. 1st cousins X 2     Age 72's    SOCIAL HX: see hpi   Current Outpatient Medications:  .  albuterol (PROVENTIL HFA;VENTOLIN HFA) 108 (90 Base)  MCG/ACT inhaler, Inhale 2 puffs into the lungs every 6 (six) hours as needed., Disp: 18 g, Rfl: 0 .  beclomethasone (QVAR) 40 MCG/ACT inhaler, Inhale 2 puffs into the lungs 2 (two) times daily., Disp: 1 Inhaler, Rfl: 3 .  BIOTIN PO, Take by mouth., Disp: , Rfl:  .  clindamycin (CLEOCIN-T) 1 % external solution, Apply topically 2 (two) times daily., Disp: 30 mL, Rfl: 5 .  meloxicam (MOBIC) 7.5 MG tablet, Take 1 tablet (7.5 mg total) by mouth daily., Disp: 30 tablet, Rfl: 6 .  Multiple Vitamins-Minerals (HAIR/SKIN/NAILS PO), Take by mouth., Disp: , Rfl:  .  Rhubarb (ESTROVEN MENOPAUSE RELIEF PO), Take by mouth., Disp: , Rfl:  .  tretinoin (RETIN-A) 0.05 % cream, Apply topically at bedtime., Disp: 45 g, Rfl: 5 .  venlafaxine XR (EFFEXOR-XR) 37.5 MG 24 hr capsule, Take 1 capsule (37.5 mg total) by mouth daily with breakfast., Disp: 30 capsule, Rfl: 2 .  Vitamin D, Ergocalciferol, (DRISDOL) 1.25 MG (50000 UT) CAPS capsule, TAKE ONE CAPSULE EVERY 7 DAYS FOR 12 DOSES., Disp: 12 capsule, Rfl: 0  EXAM:  VITALS per patient if applicable:  GENERAL: alert, oriented, appears well and in no acute distress  HEENT: atraumatic, conjunttiva clear, no obvious abnormalities on inspection of external nose and ears  NECK: normal movements of the head and neck  LUNGS: on inspection no signs of respiratory distress, breathing rate appears normal, no  obvious gross SOB, gasping or wheezing  CV: no obvious cyanosis  MS: moves all visible extremities without noticeable abnormality  PSYCH/NEURO: pleasant and cooperative, no obvious depression or anxiety, speech and thought processing grossly intact  ASSESSMENT AND PLAN:  Discussed the following assessment and plan: >45 minutes spent on this encounter.  Fatigue, unspecified type  Anxiety  Family history of thyroid disease   -we discussed possible serious and likely etiologies, options for evaluation and workup, limitations of telemedicine visit vs in person  visit, treatment, treatment risks and precautions. Pt prefers to treat via telemedicine empirically rather then risking or undertaking an in person visit at this moment. Will check some labs including CBC, CMP, TSH and B12. Possible stress/anxiety. Advised PCP follow up in 1-2 months. Counseling and possible consideration of medication for stress as she reports is not taking the effexor. She agrees to let us know if labs neg and she wants to try a medication. Patient agrees to seek prompt in person care if worsening, new symptoms arise, or if is not improving with treatment.   I discussed the assessment and treatment plan with the patient. The patient was provided an opportunity to ask questions and all were answered. The patient agreed with the plan and demonstrated an understanding of the instructions.   The patient was advised to call back or seek an in-person evaluation if the symptoms worsen or if the condition fails to improve as anticipated.   Lucretia Kern, DO

## 2019-04-03 ENCOUNTER — Other Ambulatory Visit (INDEPENDENT_AMBULATORY_CARE_PROVIDER_SITE_OTHER): Payer: BC Managed Care – PPO

## 2019-04-03 DIAGNOSIS — R5383 Other fatigue: Secondary | ICD-10-CM

## 2019-04-03 DIAGNOSIS — Z8349 Family history of other endocrine, nutritional and metabolic diseases: Secondary | ICD-10-CM | POA: Diagnosis not present

## 2019-04-03 LAB — COMPREHENSIVE METABOLIC PANEL
ALT: 11 U/L (ref 0–35)
AST: 17 U/L (ref 0–37)
Albumin: 4 g/dL (ref 3.5–5.2)
Alkaline Phosphatase: 55 U/L (ref 39–117)
BUN: 12 mg/dL (ref 6–23)
CO2: 31 mEq/L (ref 19–32)
Calcium: 9.5 mg/dL (ref 8.4–10.5)
Chloride: 104 mEq/L (ref 96–112)
Creatinine, Ser: 0.79 mg/dL (ref 0.40–1.20)
GFR: 74.76 mL/min (ref >60.00)
Glucose, Bld: 89 mg/dL (ref 70–99)
Potassium: 4.2 mEq/L (ref 3.5–5.1)
Sodium: 140 mEq/L (ref 135–145)
Total Bilirubin: 0.6 mg/dL (ref 0.2–1.2)
Total Protein: 6.9 g/dL (ref 6.0–8.3)

## 2019-04-03 LAB — CBC
HCT: 40.3 % (ref 36.0–46.0)
Hemoglobin: 13.4 g/dL (ref 12.0–15.0)
MCHC: 33.3 g/dL (ref 30.0–36.0)
MCV: 95.4 fl (ref 78.0–100.0)
Platelets: 256 10*3/uL (ref 150.0–400.0)
RBC: 4.22 Mil/uL (ref 3.87–5.11)
RDW: 13 % (ref 11.5–15.5)
WBC: 5.7 10*3/uL (ref 4.0–10.5)

## 2019-04-03 LAB — TSH: TSH: 1.24 u[IU]/mL (ref 0.35–4.50)

## 2019-04-03 LAB — VITAMIN B12: Vitamin B-12: 841 pg/mL (ref 211–911)

## 2019-04-04 ENCOUNTER — Ambulatory Visit: Payer: BC Managed Care – PPO | Attending: Internal Medicine

## 2019-04-04 DIAGNOSIS — Z23 Encounter for immunization: Secondary | ICD-10-CM | POA: Insufficient documentation

## 2019-04-04 NOTE — Progress Notes (Signed)
   Covid-19 Vaccination Clinic  Name:  Regina Mendez    MRN: [id number] DOB: 1960-07-19  04/04/2019  Regina Mendez was observed post Covid-19 immunization for 15 minutes without incident. She was provided with Vaccine Information Sheet and instruction to access the V-Safe system.   Regina Mendez was instructed to call 911 with any severe reactions post vaccine: Marland Kitchen Difficulty breathing  . Swelling of face and throat  . A fast heartbeat  . A bad rash all over body  . Dizziness and weakness   Immunizations Administered    Name Date Dose VIS Date Route   Pfizer COVID-19 Vaccine 04/04/2019 12:43 PM 0.3 mL 01/11/2019 Intramuscular   Manufacturer: [place]   Lot: [id number]   [place]: [id number]

## 2019-04-30 ENCOUNTER — Ambulatory Visit: Payer: BC Managed Care – PPO | Attending: Internal Medicine

## 2019-04-30 DIAGNOSIS — Z23 Encounter for immunization: Secondary | ICD-10-CM

## 2019-04-30 NOTE — Progress Notes (Signed)
   Covid-19 Vaccination Clinic  Name:  Brinklee Cruser    MRN: [id number] DOB: Apr 02, 1960  04/30/2019  Ms. Shepp was observed post Covid-19 immunization for 15 minutes without incident. She was provided with Vaccine Information Sheet and instruction to access the V-Safe system.   Ms. Deaton was instructed to call 911 with any severe reactions post vaccine: Marland Kitchen Difficulty breathing  . Swelling of face and throat  . A fast heartbeat  . A bad rash all over body  . Dizziness and weakness   Immunizations Administered    Name Date Dose VIS Date Route   Pfizer COVID-19 Vaccine 04/30/2019  3:37 PM 0.3 mL 01/11/2019 Intramuscular   Manufacturer: [place]   Lot: [id number]   [place]: [id number]

## 2019-07-03 ENCOUNTER — Other Ambulatory Visit: Payer: Self-pay

## 2019-07-04 ENCOUNTER — Ambulatory Visit (INDEPENDENT_AMBULATORY_CARE_PROVIDER_SITE_OTHER): Payer: BC Managed Care – PPO | Admitting: Obstetrics and Gynecology

## 2019-07-04 ENCOUNTER — Encounter: Payer: Self-pay | Admitting: Obstetrics and Gynecology

## 2019-07-04 VITALS — BP 118/76 | Ht 66.0 in | Wt 130.0 lb

## 2019-07-04 DIAGNOSIS — N898 Other specified noninflammatory disorders of vagina: Secondary | ICD-10-CM

## 2019-07-04 DIAGNOSIS — Z01419 Encounter for gynecological examination (general) (routine) without abnormal findings: Secondary | ICD-10-CM | POA: Diagnosis not present

## 2019-07-04 DIAGNOSIS — R350 Frequency of micturition: Secondary | ICD-10-CM | POA: Diagnosis not present

## 2019-07-04 MED ORDER — ESTRADIOL 0.1 MG/GM VA CREA: 1.0000 | TOPICAL_CREAM | Freq: Every day | VAGINAL | 12 refills | Status: DC

## 2019-07-04 NOTE — Patient Instructions (Signed)
We will check a urinalysis today because of the urinary frequency concern Please limit intake of fluids after 7 PM to limit getting up to go to the bathroom overnight You can start the vaginal estrogen cream (Estrace) and do that nightly for a month and then decrease use to 2 nights per week after that, let us know right away if you experience any vaginal bleeding.  Might also cause changes to the breasts as estrogen patches and pills can.

## 2019-07-04 NOTE — Progress Notes (Signed)
Regina Mendez [birth year] [id number]  SUBJECTIVE:  59 y.o. G2P2002 female for annual routine gynecologic exam. Gynecologic concerns include vaginal dryness and increased urinary frequency.  Her husband has been dealing with low libido and possibly depression, and they are working together with sexual counseling.  She has been having some slight pain in her left inner breast near the sternum.  Current Outpatient Medications  Medication Sig Dispense Refill  . albuterol (PROVENTIL HFA;VENTOLIN HFA) 108 (90 Base) MCG/ACT inhaler Inhale 2 puffs into the lungs every 6 (six) hours as needed. 18 g 0  . beclomethasone (QVAR) 40 MCG/ACT inhaler Inhale 2 puffs into the lungs 2 (two) times daily. 1 Inhaler 3  . BIOTIN PO Take by mouth.    . clindamycin (CLEOCIN-T) 1 % external solution Apply topically 2 (two) times daily. 30 mL 5  . meloxicam (MOBIC) 7.5 MG tablet Take 1 tablet (7.5 mg total) by mouth daily. 30 tablet 6  . Multiple Vitamins-Minerals (HAIR/SKIN/NAILS PO) Take by mouth.    . Rhubarb (ESTROVEN MENOPAUSE RELIEF PO) Take by mouth.    . tretinoin (RETIN-A) 0.05 % cream Apply topically at bedtime. 45 g 5  . VITAMIN D PO Take by mouth.    . venlafaxine XR (EFFEXOR-XR) 37.5 MG 24 hr capsule Take 1 capsule (37.5 mg total) by mouth daily with breakfast. (Patient not taking: Reported on 07/04/2019) 30 capsule 2   No current facility-administered medications for this visit.   Allergies: Codeine and Sulfonamide derivatives  Patient's last menstrual period was 08/13/2012.  Past medical history,surgical history, problem list, medications, allergies, family history and social history were all reviewed and documented as reviewed in the EPIC chart.  ROS:  Feeling well. No dyspnea or chest pain on exertion.  No abdominal pain, change in bowel habits, black or bloody stools. Increased urinary frequency symptoms. GYN ROS: no abnormal bleeding, pelvic pain or discharge, left inner breast pain near sternum,  no new or enlarging lumps on self exam. No neurological complaints.   OBJECTIVE:  BP 118/76   Ht 5\' 6"  (1.676 m)   Wt 130 lb (59 kg)   LMP 08/13/2012 Comment: gyn  BMI 20.98 kg/m  The patient appears well, alert, oriented x 3, in no distress. ENT normal.  Neck supple. No cervical or supraclavicular adenopathy or thyromegaly.  Lungs are clear, good air entry, no wheezes, rhonchi or rales. S1 and S2 normal, no murmurs, regular rate and rhythm.  Abdomen soft without tenderness, guarding, mass or organomegaly.  Neurological is normal, no focal findings.  BREAST EXAM: breasts appear normal, no suspicious masses, no skin or nipple changes or axillary nodes  PELVIC EXAM: VULVA: normal appearing vulva with no masses, tenderness or lesions, atrophic changes, VAGINA: normal appearing vagina with normal color and discharge, no lesions, atrophic changes, CERVIX: normal appearing cervix without discharge or lesions, UTERUS: uterus is normal size, shape, consistency and nontender, ADNEXA: normal adnexa in size, nontender and no masses  Chaperone: [name] present during the examination  ASSESSMENT:  59 y.o. [id number] here for annual gynecologic exam  PLAN:   1. Postmenopausal and sexual concerns.  Concerns with vaginal dryness.  No vaginal bleeding.  No significant hot flashes or night sweats.  We discussed the possibility of using vaginal estrogen cream.  Low risks of systemic absorption but there are risks from this to include thrombotic diseases such as heart attack, stroke, DVT, PE, potential for increased risk of breast cancer and uterine cancer.  She says the vaginal dryness has  been bothering her a lot especially as she hopes to resume sexual activity.  I suggested that she do check with her specialist at [facility] regarding her opinion for use of vaginal estrogen given her history of breast hyperplasia.  Prescription for vaginal Estrace cream 0.1 mg/g is sent to her pharmacy.  She and her  husband will continue to work with the counselor. 2. Pap smear/HPV 05/2017.  No significant history of abnormal Pap smears.  Next Pap smear due 2024 following the current guidelines recommending the 5 year cotesting interval. 3. Mammogram 04/2018.  History of breast biopsy with hyperplasia with atypia.  She had a breast MRI and originally was doing this annually but fell back to less frequent screening intervals, following with her other physician at [facility] as she was apparently told she had a 30% lifetime chance of developing breast cancer.  She mentions some left breast pain today but no palpable abnormality.  She says she is getting a breast MRI this month.  She will continue follow-up with them in regards to breast screening.  Normal breast exam today.   4. Colonoscopy 2019.  Recommended that she follow up at the recommended interval.   5. DEXA 2010 reported as normal, recommend repeat DEXA at age 31.  80. Health maintenance.  No labs today as she recently these completed with her primary care provider.    Return annually or sooner, prn.  Joseph Pierini MD 07/04/19

## 2019-07-06 LAB — URINALYSIS, COMPLETE W/RFL CULTURE
Bacteria, UA: NONE SEEN /HPF
Bilirubin Urine: NEGATIVE
Glucose, UA: NEGATIVE
Hyaline Cast: NONE SEEN /LPF
Ketones, ur: NEGATIVE
Leukocyte Esterase: NEGATIVE
Nitrites, Initial: NEGATIVE
Protein, ur: NEGATIVE
Specific Gravity, Urine: 1.02 (ref 1.001–1.03)
WBC, UA: NONE SEEN /HPF (ref 0–5)
pH: 7.5 (ref 5.0–8.0)

## 2019-07-06 LAB — URINE CULTURE
MICRO NUMBER:: 10549043
Result:: NO GROWTH
SPECIMEN QUALITY:: ADEQUATE

## 2019-07-06 LAB — CULTURE INDICATED

## 2019-07-22 ENCOUNTER — Ambulatory Visit (INDEPENDENT_AMBULATORY_CARE_PROVIDER_SITE_OTHER): Payer: BC Managed Care – PPO | Admitting: Family Medicine

## 2019-07-22 ENCOUNTER — Encounter: Payer: Self-pay | Admitting: Family Medicine

## 2019-07-22 ENCOUNTER — Other Ambulatory Visit: Payer: Self-pay

## 2019-07-22 VITALS — BP 122/72 | HR 76 | Temp 98.2°F | Wt 131.2 lb

## 2019-07-22 DIAGNOSIS — R58 Hemorrhage, not elsewhere classified: Secondary | ICD-10-CM | POA: Diagnosis not present

## 2019-07-22 NOTE — Progress Notes (Signed)
   Subjective:    Patient ID: Regina Mendez, female    DOB: 05/07/1960, 59 y.o.   MRN: [id number]  HPI Here to check her right forearm. On 07-15-19 she had an IV placed in the arm as part of a contrasted breast MRI, and she had extensive bruising around the site with some pain. Since then the pain has subsided quite a bit and the bruising is fading.    Review of Systems  Constitutional: Negative.   Respiratory: Negative.   Cardiovascular: Negative.   Skin: Positive for color change.       Objective:   Physical Exam Constitutional:      Appearance: Normal appearance.  Cardiovascular:     Rate and Rhythm: Normal rate and regular rhythm.     Pulses: Normal pulses.     Heart sounds: Normal heart sounds.  Pulmonary:     Effort: Pulmonary effort is normal.     Breath sounds: Normal breath sounds.  Skin:    Comments: The right forearm has a large area of ecchymosis that is fading. This is slightly tender. She has full ROM of the elbow and wrist. No cords are palpated   Neurological:     Mental Status: She is alert.           Assessment & Plan:  She had a wound from an IV needle that is healing as expected. Recheck prn. Alysia Penna, MD

## 2019-09-18 ENCOUNTER — Telehealth: Payer: BC Managed Care – PPO | Admitting: Internal Medicine

## 2019-09-18 ENCOUNTER — Telehealth (INDEPENDENT_AMBULATORY_CARE_PROVIDER_SITE_OTHER): Payer: BC Managed Care – PPO | Admitting: Internal Medicine

## 2019-09-18 ENCOUNTER — Other Ambulatory Visit: Payer: Self-pay

## 2019-09-18 DIAGNOSIS — M549 Dorsalgia, unspecified: Secondary | ICD-10-CM

## 2019-09-18 DIAGNOSIS — M542 Cervicalgia: Secondary | ICD-10-CM

## 2019-09-18 DIAGNOSIS — R5383 Other fatigue: Secondary | ICD-10-CM
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
## 2019-10-09 ENCOUNTER — Telehealth: Payer: Self-pay | Admitting: Internal Medicine

## 2019-10-09 DIAGNOSIS — M542 Cervicalgia: Secondary | ICD-10-CM

## 2019-10-09 NOTE — Telephone Encounter (Signed)
Pt stated when she spoke to PCP on 8/18 she was told that she would be referred to a physical therapist for your neck and she has not heard anything. She would like a call back regarding this.   Pt can be reached at [phone]

## 2019-10-09 NOTE — Telephone Encounter (Signed)
Referral placed.

## 2019-10-18 ENCOUNTER — Telehealth (INDEPENDENT_AMBULATORY_CARE_PROVIDER_SITE_OTHER): Payer: BC Managed Care – PPO | Admitting: Internal Medicine

## 2019-10-18 DIAGNOSIS — R21 Rash and other nonspecific skin eruption: Secondary | ICD-10-CM

## 2019-10-18 MED ORDER — TRIAMCINOLONE ACETONIDE 0.1 % EX CREA: 1.0000 "application " | TOPICAL_CREAM | Freq: Two times a day (BID) | CUTANEOUS | 1 refills | Status: DC

## 2019-10-18 NOTE — Progress Notes (Signed)
Virtual Visit via Video Note  I connected with Regina Mendez on 10/18/19 at  3:30 PM EDT by a video enabled telemedicine application and verified that I am speaking with the correct person using two identifiers.  Location patient: home Location provider: work office Persons participating in the virtual visit: patient, provider  I discussed the limitations of evaluation and management by telemedicine and the availability of in person appointments. The patient expressed understanding and agreed to proceed.   HPI: She is calling today due to a new onset rash. Last weekend she was at a garage sale. They had items set up in the yard. Since then she has noticed a very pruritic rash on her forearms, legs and upper chest area. She is not clear if this is poison ivy or poison oak. She does not have any new bath soaps, laundry detergents, lotions, perfumes.   ROS: Constitutional: Denies fever, chills, diaphoresis, appetite change and fatigue.  HEENT: Denies photophobia, eye pain, redness, hearing loss, ear pain, congestion, sore throat, rhinorrhea, sneezing, mouth sores, trouble swallowing, neck pain, neck stiffness and tinnitus.   Respiratory: Denies SOB, DOE, cough, chest tightness,  and wheezing.   Cardiovascular: Denies chest pain, palpitations and leg swelling.  Gastrointestinal: Denies nausea, vomiting, abdominal pain, diarrhea, constipation, blood in stool and abdominal distention.  Genitourinary: Denies dysuria, urgency, frequency, hematuria, flank pain and difficulty urinating.  Endocrine: Denies: hot or cold intolerance, sweats, changes in hair or nails, polyuria, polydipsia. Musculoskeletal: Denies myalgias, back pain, joint swelling, arthralgias and gait problem.  Skin: Denies pallor  and wound.  Neurological: Denies dizziness, seizures, syncope, weakness, light-headedness, numbness and headaches.  Hematological: Denies adenopathy. Easy bruising, personal or family bleeding  history  Psychiatric/Behavioral: Denies suicidal ideation, mood changes, confusion, nervousness, sleep disturbance and agitation   Past Medical History:  Diagnosis Date  . Acne   . Breast cyst   . Migraine headache     Past Surgical History:  Procedure Laterality Date  . APPENDECTOMY    . BREAST SURGERY     right breast bx  . BREAST SURGERY     Right lumpectomy -dysplastic cells-2018  . COLPOSCOPY    . GYNECOLOGIC CRYOSURGERY  age 59  . OVARIAN CYST SURGERY    . TONSILLECTOMY      Family History  Problem Relation Age of Onset  . Thyroid cancer Mother   . Hypertension Father   . Heart disease Father   . Breast cancer Cousin        Mat. 1st cousins X 2     Age 40's    SOCIAL HX:   reports that she has never smoked. She has never used smokeless tobacco. She reports current alcohol use of about 1.0 standard drink of alcohol per week. She reports that she does not use drugs.   Current Outpatient Medications:  .  albuterol (PROVENTIL HFA;VENTOLIN HFA) 108 (90 Base) MCG/ACT inhaler, Inhale 2 puffs into the lungs every 6 (six) hours as needed., Disp: 18 g, Rfl: 0 .  beclomethasone (QVAR) 40 MCG/ACT inhaler, Inhale 2 puffs into the lungs 2 (two) times daily., Disp: 1 Inhaler, Rfl: 3 .  BIOTIN PO, Take by mouth., Disp: , Rfl:  .  clindamycin (CLEOCIN-T) 1 % external solution, Apply topically 2 (two) times daily., Disp: 30 mL, Rfl: 5 .  estradiol (ESTRACE) 0.1 MG/GM vaginal cream, Place 1 Applicatorful vaginally at bedtime. After 1 month of use, decrease application to 2 nights per week., Disp: 42.5 g, Rfl: 12 .  meloxicam (MOBIC) 7.5 MG tablet, Take 1 tablet (7.5 mg total) by mouth daily., Disp: 30 tablet, Rfl: 6 .  Multiple Vitamins-Minerals (HAIR/SKIN/NAILS PO), Take by mouth., Disp: , Rfl:  .  Rhubarb (ESTROVEN MENOPAUSE RELIEF PO), Take by mouth., Disp: , Rfl:  .  tretinoin (RETIN-A) 0.05 % cream, Apply topically at bedtime., Disp: 45 g, Rfl: 5 .  triamcinolone cream  (KENALOG) 0.1 %, Apply 1 application topically 2 (two) times daily., Disp: 30 g, Rfl: 1 .  venlafaxine XR (EFFEXOR-XR) 37.5 MG 24 hr capsule, Take 1 capsule (37.5 mg total) by mouth daily with breakfast., Disp: 30 capsule, Rfl: 2 .  VITAMIN D PO, Take by mouth., Disp: , Rfl:   EXAM:   VITALS per patient if applicable: None reported  GENERAL: alert, oriented, appears well and in no acute distress  HEENT: atraumatic, conjunttiva clear, no obvious abnormalities on inspection of external nose and ears, wears corrective lenses  NECK: normal movements of the head and neck  LUNGS: on inspection no signs of respiratory distress, breathing rate appears normal, no obvious gross increased work of breathing, gasping or wheezing  CV: no obvious cyanosis  MS: moves all visible extremities without noticeable abnormality  Skin: On her left forearm she has a patch of red papules with streaks, more of a flat rash on her upper chest area.  PSYCH/NEURO: pleasant and cooperative, no obvious depression or anxiety, speech and thought processing grossly intact  ASSESSMENT AND PLAN:   Rash  -Etiology not 100% clear but suspect poison ivy. -Advised Benadryl nightly, triamcinolone cream and follow-up in 1 week if no improvement.     I discussed the assessment and treatment plan with the patient. The patient was provided an opportunity to ask questions and all were answered. The patient agreed with the plan and demonstrated an understanding of the instructions.   The patient was advised to call back or seek an in-person evaluation if the symptoms worsen or if the condition fails to improve as anticipated.    Lelon Frohlich, MD  [facility] at [facility]

## 2019-10-23 ENCOUNTER — Other Ambulatory Visit: Payer: Self-pay

## 2019-10-23 ENCOUNTER — Encounter: Payer: Self-pay | Admitting: Family Medicine

## 2019-10-23 ENCOUNTER — Ambulatory Visit: Payer: BC Managed Care – PPO | Admitting: Family Medicine

## 2019-10-23 VITALS — BP 110/60 | HR 82 | Temp 98.8°F | Ht 66.0 in | Wt 129.0 lb

## 2019-10-23 DIAGNOSIS — R21 Rash and other nonspecific skin eruption: Secondary | ICD-10-CM | POA: Diagnosis not present

## 2019-10-23 DIAGNOSIS — R58 Hemorrhage, not elsewhere classified: Secondary | ICD-10-CM

## 2019-10-23 MED ORDER — PREDNISONE 20 MG PO TABS: ORAL_TABLET | ORAL | 0 refills | Status: DC

## 2019-10-23 NOTE — Progress Notes (Signed)
Established Patient Office Visit  Subjective:  Patient ID: Regina Mendez, female    DOB: Jun 26, 1960  Age: 59 y.o. MRN: [id number]  CC:  Chief Complaint  Patient presents with  . Rash    itchy legs, breast and thighs x1 week, used Benadryl with no relief    HPI [name] presents for pruritic rash fairly widely distributed on her legs, thighs, upper chest area and now some around her waist area.  She has especially prominent involvement of the left forearm. She had recent virtual visit and there was some concern of whether this could have been a contact dermatitis.  She has not seen any vesicles.  She used some triamcinolone cream without improvement.  She has had some spread of rash since then.  No fever.  No significant arthralgias.  No adenopathy.  Appetite and weight stable.  Denies any changes soaps or detergent.  Denies any new medications.  She does have some scattered fairly diffuse bruises in multiple areas and not clear if this is secondary to scratching.  She has not noted any other bleeding issues such as nosebleeds or hematuria or gum bleeding  Past Medical History:  Diagnosis Date  . Acne   . Breast cyst   . Migraine headache     Past Surgical History:  Procedure Laterality Date  . APPENDECTOMY    . BREAST SURGERY     right breast bx  . BREAST SURGERY     Right lumpectomy -dysplastic cells-2018  . COLPOSCOPY    . GYNECOLOGIC CRYOSURGERY  age 48  . OVARIAN CYST SURGERY    . TONSILLECTOMY      Family History  Problem Relation Age of Onset  . Thyroid cancer Mother   . Hypertension Father   . Heart disease Father   . Breast cancer Cousin        Mat. 1st cousins X 2     Age 65's    Social History   Socioeconomic History  . Marital status: Married    Spouse name: Not on file  . Number of children: Not on file  . Years of education: Not on file  . Highest education level: Not on file  Occupational History  . Not on file  Tobacco Use  . Smoking  status: Never Smoker  . Smokeless tobacco: Never Used  Vaping Use  . Vaping Use: Never used  Substance and Sexual Activity  . Alcohol use: Yes    Alcohol/week: 1.0 standard drink    Types: 1 Standard drinks or equivalent per week  . Drug use: No  . Sexual activity: Not Currently    Birth control/protection: Post-menopausal    Comment: 1st intercourse 59 yo-More than 5 partners  Other Topics Concern  . Not on file  Social History Narrative  . Not on file   Social Determinants of Health   Financial Resource Strain:   . Difficulty of Paying Living Expenses: Not on file  Food Insecurity:   . Worried About Charity fundraiser in the Last Year: Not on file  . Ran Out of Food in the Last Year: Not on file  Transportation Needs:   . Lack of Transportation (Medical): Not on file  . Lack of Transportation (Non-Medical): Not on file  Physical Activity:   . Days of Exercise per Week: Not on file  . Minutes of Exercise per Session: Not on file  Stress:   . Feeling of Stress : Not on file  Social Connections:   .  Frequency of Communication with Friends and Family: Not on file  . Frequency of Social Gatherings with Friends and Family: Not on file  . Attends Religious Services: Not on file  . Active Member of Clubs or Organizations: Not on file  . Attends Archivist Meetings: Not on file  . Marital Status: Not on file  Intimate Partner Violence:   . Fear of Current or Ex-Partner: Not on file  . Emotionally Abused: Not on file  . Physically Abused: Not on file  . Sexually Abused: Not on file    Outpatient Medications Prior to Visit  Medication Sig Dispense Refill  . albuterol (PROVENTIL HFA;VENTOLIN HFA) 108 (90 Base) MCG/ACT inhaler Inhale 2 puffs into the lungs every 6 (six) hours as needed. 18 g 0  . beclomethasone (QVAR) 40 MCG/ACT inhaler Inhale 2 puffs into the lungs 2 (two) times daily. 1 Inhaler 3  . BIOTIN PO Take by mouth.    . clindamycin (CLEOCIN-T) 1 %  external solution Apply topically 2 (two) times daily. 30 mL 5  . estradiol (ESTRACE) 0.1 MG/GM vaginal cream Place 1 Applicatorful vaginally at bedtime. After 1 month of use, decrease application to 2 nights per week. 42.5 g 12  . meloxicam (MOBIC) 7.5 MG tablet Take 1 tablet (7.5 mg total) by mouth daily. 30 tablet 6  . Multiple Vitamins-Minerals (HAIR/SKIN/NAILS PO) Take by mouth.    . Rhubarb (ESTROVEN MENOPAUSE RELIEF PO) Take by mouth.    . tretinoin (RETIN-A) 0.05 % cream Apply topically at bedtime. 45 g 5  . triamcinolone cream (KENALOG) 0.1 % Apply 1 application topically 2 (two) times daily. 30 g 1  . venlafaxine XR (EFFEXOR-XR) 37.5 MG 24 hr capsule Take 1 capsule (37.5 mg total) by mouth daily with breakfast. 30 capsule 2  . VITAMIN D PO Take by mouth.     No facility-administered medications prior to visit.    Allergies  Allergen Reactions  . Codeine Other (See Comments)    Drowsy  . Sulfonamide Derivatives Dermatitis    ROS Review of Systems  Constitutional: Negative for chills and fever.  HENT: Negative for sore throat.   Respiratory: Negative for cough and shortness of breath.   Cardiovascular: Negative for chest pain.  Gastrointestinal: Negative for abdominal pain.  Skin: Positive for rash.  Hematological: Negative for adenopathy. Bruises/bleeds easily.      Objective:    Physical Exam Vitals reviewed.  Constitutional:      Appearance: Normal appearance.  Cardiovascular:     Rate and Rhythm: Normal rate and regular rhythm.  Skin:    Findings: Rash present.     Comments: Has multiple areas of rash predominately left forearm but also some scattered on her thighs and trunk.  These are various sizes but are predominantly macular plaque-like with some scaling on the surface.  No vesicle changes.  No pustules.  Nontender.  Blanches somewhat with pressure.  She does have multiple bruises on her lower extremities as well as some on her trunk.  She also has a few  small scattered petechiae around the waist area but not generalized.  Neurological:     Mental Status: She is alert.     BP 110/60 (BP Location: Right Arm, Patient Position: Sitting, Cuff Size: Normal)   Pulse 82   Temp 98.8 F (37.1 C) (Oral)   Ht 5\' 6"  (1.676 m)   Wt 129 lb (58.5 kg)   LMP 08/13/2012 Comment: gyn  BMI 20.82 kg/m  Wt Readings  from Last 3 Encounters:  10/23/19 129 lb (58.5 kg)  07/22/19 131 lb 3.2 oz (59.5 kg)  07/04/19 130 lb (59 kg)     Health Maintenance Due  Topic Date Due  . Hepatitis C Screening  Never done  . HIV Screening  Never done  . MAMMOGRAM  12/16/2017  . INFLUENZA VACCINE  09/01/2019    There are no preventive care reminders to display for this patient.  Lab Results  Component Value Date   TSH 1.24 04/03/2019   Lab Results  Component Value Date   WBC 5.7 04/03/2019   HGB 13.4 04/03/2019   HCT 40.3 04/03/2019   MCV 95.4 04/03/2019   PLT 256.0 04/03/2019   Lab Results  Component Value Date   NA 140 04/03/2019   K 4.2 04/03/2019   CO2 31 04/03/2019   GLUCOSE 89 04/03/2019   BUN 12 04/03/2019   CREATININE 0.79 04/03/2019   BILITOT 0.6 04/03/2019   ALKPHOS 55 04/03/2019   AST 17 04/03/2019   ALT 11 04/03/2019   PROT 6.9 04/03/2019   ALBUMIN 4.0 04/03/2019   CALCIUM 9.5 04/03/2019   GFR 74.76 04/03/2019   Lab Results  Component Value Date   CHOL 213 (H) 07/13/2018   Lab Results  Component Value Date   HDL 47.40 07/13/2018   Lab Results  Component Value Date   LDLCALC 145 (H) 07/13/2018   Lab Results  Component Value Date   TRIG 104.0 07/13/2018   Lab Results  Component Value Date   CHOLHDL 4 07/13/2018   Lab Results  Component Value Date   HGBA1C 5.9 07/13/2018      Assessment & Plan:   Patient presents with approximate 1 week history of pruritic rash.  This does not appear consistent with contact dermatitis.  This is almost more plaque-like in several areas does blanch with pressure.  She also has  multiple bruises and denies any trauma but some of these are in close vicinity to rash which raises issue if this could be from scratching.  Her rash does not particularly look like eczema.  She has a few small petechiae type lesions around her waist.  -Check labs with CBC, comprehensive metabolic panel, sed rate, PT, PTT  -We gave her prednisone 20 mg 2 tablets daily for the next 5 days because of her severe itching.  -Would have low threshold to consider biopsy.  Check labs above first.    Meds ordered this encounter  Medications  . predniSONE (DELTASONE) 20 MG tablet    Sig: Take two tablets daily for 5 days    Dispense:  10 tablet    Refill:  0    Follow-up: No follow-ups on file.    Carolann Littler, MD

## 2019-10-24 LAB — CBC WITH DIFFERENTIAL/PLATELET
Absolute Monocytes: 708 cells/uL (ref 200–950)
Basophils Absolute: 31 cells/uL (ref 0–200)
Basophils Relative: 0.5 %
Eosinophils Absolute: 43 cells/uL (ref 15–500)
Eosinophils Relative: 0.7 %
HCT: 39.2 % (ref 35.0–45.0)
Hemoglobin: 13.1 g/dL (ref 11.7–15.5)
Lymphs Abs: 1232 cells/uL (ref 850–3900)
MCH: 31.3 pg (ref 27.0–33.0)
MCHC: 33.4 g/dL (ref 32.0–36.0)
MCV: 93.6 fL (ref 80.0–100.0)
MPV: 10.2 fL (ref 7.5–12.5)
Monocytes Relative: 11.6 %
Neutro Abs: 4087 cells/uL (ref 1500–7800)
Neutrophils Relative %: 67 %
Platelets: 263 10*3/uL (ref 140–400)
RBC: 4.19 10*6/uL (ref 3.80–5.10)
RDW: 12.5 % (ref 11.0–15.0)
Total Lymphocyte: 20.2 %
WBC: 6.1 10*3/uL (ref 3.8–10.8)

## 2019-10-24 LAB — COMPREHENSIVE METABOLIC PANEL
AG Ratio: 1.5 (calc) (ref 1.0–2.5)
ALT: 13 U/L (ref 6–29)
AST: 20 U/L (ref 10–35)
Albumin: 4.5 g/dL (ref 3.6–5.1)
Alkaline phosphatase (APISO): 58 U/L (ref 37–153)
BUN: 10 mg/dL (ref 7–25)
CO2: 30 mmol/L (ref 20–32)
Calcium: 9.8 mg/dL (ref 8.6–10.4)
Chloride: 103 mmol/L (ref 98–110)
Creat: 0.89 mg/dL (ref 0.50–1.05)
Globulin: 3.1 g/dL (calc) (ref 1.9–3.7)
Glucose, Bld: 106 mg/dL — ABNORMAL HIGH (ref 65–99)
Potassium: 3.8 mmol/L (ref 3.5–5.3)
Sodium: 141 mmol/L (ref 135–146)
Total Bilirubin: 0.5 mg/dL (ref 0.2–1.2)
Total Protein: 7.6 g/dL (ref 6.1–8.1)

## 2019-10-24 LAB — APTT: aPTT: 26 s (ref 23–32)

## 2019-10-24 LAB — PROTIME-INR
INR: 1
Prothrombin Time: 10.4 s (ref 9.0–11.5)

## 2019-10-24 LAB — SEDIMENTATION RATE

## 2019-10-25 ENCOUNTER — Encounter: Payer: Self-pay | Admitting: Family Medicine

## 2019-10-25 ENCOUNTER — Telehealth: Payer: Self-pay | Admitting: Internal Medicine

## 2019-10-25 DIAGNOSIS — R21 Rash and other nonspecific skin eruption: Secondary | ICD-10-CM

## 2019-10-25 DIAGNOSIS — R58 Hemorrhage, not elsewhere classified: Secondary | ICD-10-CM

## 2019-10-25 NOTE — Telephone Encounter (Signed)
Referral placed.

## 2019-10-25 NOTE — Telephone Encounter (Signed)
Patient was seen by Dr [name] 10/23/19
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
## 2019-10-25 NOTE — Telephone Encounter (Signed)
Ok to refer to dermatology

## 2019-10-28 ENCOUNTER — Encounter: Payer: Self-pay | Admitting: Internal Medicine

## 2019-10-29 ENCOUNTER — Encounter: Payer: Self-pay | Admitting: Internal Medicine

## 2019-11-01 ENCOUNTER — Ambulatory Visit: Payer: BC Managed Care – PPO | Attending: Internal Medicine | Admitting: Physical Therapy

## 2019-11-01 ENCOUNTER — Other Ambulatory Visit: Payer: Self-pay

## 2019-11-08 ENCOUNTER — Telehealth: Payer: Self-pay | Admitting: Internal Medicine

## 2019-11-08 ENCOUNTER — Ambulatory Visit: Payer: BC Managed Care – PPO | Admitting: Physical Therapy

## 2019-11-08 NOTE — Telephone Encounter (Signed)
Pt is calling in stating the she would like to speak with someone concerning a bill that she had received and now she has been sent to collections and would like to see if someone could please give her a call back.  Pt stated that she has really only had labs and a few other things done but to send her to collections she is not happy about it.  Pt would like to have a call back today to discuss it.

## 2019-11-11 NOTE — Telephone Encounter (Signed)
Patient called with updated information on the charges and would like for Tanya to call her back at [phone]. It's very important that Lavella Lemons calls her back today.  Please advise

## 2019-11-13 NOTE — Telephone Encounter (Addendum)
Spoke with patient concerning her bill that was sent to collections. Pt called billing to get information on the dates of service that was sent to collection. Pt informed me of dates 07/13/2018, 09/27/2018, and 11/12/2018 and is aware of the total for the bill. Pt also informed me that her copay should be $10 versus $25. Made patient aware that I would investigate and call her back with the results.   Called our department billing coding manager to help me investigate the bills. During our investigation,on 07/13/2018- pt was billed for a labwork that was done for $85 and not covered by [place]. On 09/27/2018 and 11/12/2018- pt was billed for a $25 copay per [place]. Billing manager advise to have patient call [employer] to verify her copay amount for the dates of service.

## 2019-11-14 NOTE — Telephone Encounter (Signed)
Will forward concern to billing leadership, who will handle all follow up.

## 2019-11-21 ENCOUNTER — Ambulatory Visit: Payer: BC Managed Care – PPO | Attending: Internal Medicine

## 2019-11-21 DIAGNOSIS — Z23 Encounter for immunization: Secondary | ICD-10-CM

## 2019-11-21 NOTE — Progress Notes (Signed)
   Covid-19 Vaccination Clinic  Name:  Novis League    MRN: [id number] DOB: 1960/02/22  11/21/2019  Ms. Nokes was observed post Covid-19 immunization for 15 minutes without incident. She was provided with Vaccine Information Sheet and instruction to access the V-Safe system.   Ms. Dershem was instructed to call 911 with any severe reactions post vaccine: Marland Kitchen Difficulty breathing  . Swelling of face and throat  . A fast heartbeat  . A bad rash all over body  . Dizziness and weakness

## 2019-11-22 ENCOUNTER — Ambulatory Visit: Payer: BC Managed Care – PPO

## 2019-11-29 NOTE — Telephone Encounter (Signed)
Patient called still upset stating to me that I was suppose to handle her bills she have. I made patient aware again that I do not handle bills and I forwarded all her concerns to billing leadership and I ask patient did she speak to billing department and what did they advise? Patient stated yes and was told to speak to [facility] about her copayment and now the patient is blaming me due to [facility] website will not allow her to add Dr. Jerilee Hoh. I advised patient that I can not fix [employer] website and she would have to speak to [facility] about the website. Patient asked for a letter stating that Dr. Jerilee Hoh is her provider. Made pt aware I will have a letter written stating she is Dr. Jerilee Hoh patient. Patient also stated that I was not listening to her and I made patient aware I was listening to her and she will have to speak with the billing department again to help with her bills. Pt is also stated she is upset our Surveyor, quantity have not called her yet. I made patient aware I will reach out to my assistant director and if she is still concerned about her bills to reach back out to billing department to see if they can help.

## 2019-12-24 ENCOUNTER — Other Ambulatory Visit: Payer: Self-pay | Admitting: Internal Medicine

## 2019-12-24 DIAGNOSIS — Z8709 Personal history of other diseases of the respiratory system: Secondary | ICD-10-CM

## 2020-01-27 ENCOUNTER — Encounter: Payer: Self-pay | Admitting: Physician Assistant

## 2020-01-27 ENCOUNTER — Telehealth (INDEPENDENT_AMBULATORY_CARE_PROVIDER_SITE_OTHER): Payer: BC Managed Care – PPO | Admitting: Physician Assistant

## 2020-01-27 VITALS — Temp 99.0°F | Ht 66.0 in | Wt 128.0 lb

## 2020-01-27 DIAGNOSIS — J069 Acute upper respiratory infection, unspecified: Secondary | ICD-10-CM

## 2020-01-27 MED ORDER — IPRATROPIUM BROMIDE 0.03 % NA SOLN: 2.0000 | Freq: Two times a day (BID) | NASAL | 3 refills | Status: DC

## 2020-01-27 NOTE — Progress Notes (Signed)
Virtual Visit via Video   I connected with Regina Mendez on 01/27/20 at  3:30 PM EST by a video enabled telemedicine application and verified that I am speaking with the correct person using two identifiers. Location patient: Home Location provider: [facility], Office Persons participating in the virtual visit: Iyauna, Sing PA-C, Corky Mull, LPN   I discussed the limitations of evaluation and management by telemedicine and the availability of in person appointments. The patient expressed understanding and agreed to proceed.  I acted as a Neurosurgeon for [employer], PA-C [employer], LPN   Subjective:   HPI:   Patient is requesting evaluation for possible COVID-19.  Symptom onset: Saturday  Travel/contacts: No exposure that she is aware of. Daughter is sick but was tested for COVID and was negative, was told by the pediatrician she likely has a cold. Pt was in [place] came home Dec 8th and COVID test was Neg.  Vaccination status: complete  Patient endorses the following symptoms: Fever (99.0), sinus headache, sinus congestion, rhinorrhea, sore throat, productive cough (expectorating clear sputum) and myalgia  Patient denies the following symptoms: ear pain, wheezing, shortness of breath, chest tightness and chest pain  Treatments tried: Ibuprofen and Nasal saline rinse  Patient risk factors: Current COVID-19 risk of complications score: 0 Smoking status: Regina Mendez  reports that she has never smoked. She has never used smokeless tobacco. If female, currently pregnant? []   Yes [x]   No  ROS: See pertinent positives and negatives per HPI.  Patient Active Problem List   Diagnosis Date Noted  . Vitamin D deficiency 07/17/2018  . History of colon polyps 10/12/2016  . 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 Routine general medical examination at a health care facility 01/14/2014  . Chronic insomnia 10/26/2012  .  Joint pain 08/16/2011  . Cervical dysplasia   . 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 Allergic rhinitis 09/07/2009  . Migraine 07/05/2007  . LUMP OR MASS IN BREAST 07/05/2007  . MENOPAUSE, EARLY 07/05/2007    Social History   Tobacco Use  . Smoking status: Never Smoker  . Smokeless tobacco: Never Used  Substance Use Topics  . Alcohol use: Yes    Alcohol/week: 1.0 standard drink    Types: 1 Standard drinks or equivalent per week    Current Outpatient Medications:  .  BIOTIN PO, Take by mouth., Disp: , Rfl:  .  clindamycin (CLEOCIN-T) 1 % external solution, Apply topically 2 (two) times daily., Disp: 30 mL, Rfl: 5 .  ipratropium (ATROVENT) 0.03 % nasal spray, Place 2 sprays into both nostrils every 12 (twelve) hours., Disp: 30 mL, Rfl: 3 .  meloxicam (MOBIC) 7.5 MG tablet, Take 1 tablet (7.5 mg total) by mouth daily., Disp: 30 tablet, Rfl: 6 .  Multiple Vitamins-Minerals (HAIR/SKIN/NAILS PO), Take by mouth., Disp: , Rfl:  .  PROAIR HFA 108 (90 Base) MCG/ACT inhaler, USE 2 PUFFS EVERY 6 HOURS AS NEEDED., Disp: 8.5 g, Rfl: 2 .  QVAR REDIHALER 40 MCG/ACT inhaler, INHALE 2 PUFFS INTO THE LUNGS TWICE DAILY, Disp: 8.7 g, Rfl: 2 .  Rhubarb (ESTROVEN MENOPAUSE RELIEF PO), Take by mouth., Disp: , Rfl:  .  tretinoin (RETIN-A) 0.05 % cream, Apply topically at bedtime., Disp: 45 g, Rfl: 5 .  triamcinolone cream (KENALOG) 0.1 %, Apply 1 application topically 2 (two) times daily., Disp: 30 g, Rfl: 1 .  VITAMIN D PO, Take by mouth., Disp: , Rfl:  .  estradiol (ESTRACE) 0.1 MG/GM vaginal  cream, Place 1 Applicatorful vaginally at bedtime. After 1 month of use, decrease application to 2 nights per week. (Patient not taking: Reported on 01/27/2020), Disp: 42.5 g, Rfl: 12 .  venlafaxine XR (EFFEXOR-XR) 37.5 MG 24 hr capsule, Take 1 capsule (37.5 mg total) by mouth daily with breakfast. (Patient not taking: Reported on 01/27/2020), Disp: 30 capsule, Rfl: 2  Allergies  Allergen Reactions  . Codeine  Other (See Comments)    Drowsy  . Sulfonamide Derivatives Dermatitis    Objective:   VITALS: Per patient if applicable, see vitals. GENERAL: Alert, appears well and in no acute distress. HEENT: Atraumatic, conjunctiva clear, no obvious abnormalities on inspection of external nose and ears. NECK: Normal movements of the head and neck. CARDIOPULMONARY: No increased WOB. Speaking in clear sentences. I:E ratio WNL.  MS: Moves all visible extremities without noticeable abnormality. PSYCH: Pleasant and cooperative, well-groomed. Speech normal rate and rhythm. Affect is appropriate. Insight and judgement are appropriate. Attention is focused, linear, and appropriate.  NEURO: CN grossly intact. Oriented as arrived to appointment on time with no prompting. Moves both UE equally.  SKIN: No obvious lesions, wounds, erythema, or cyanosis noted on face or hands.  Assessment and Plan:   Regina Mendez was seen today for covid symptoms.  Diagnoses and all orders for this visit:  Upper respiratory tract infection, unspecified type  Other orders -     ipratropium (ATROVENT) 0.03 % nasal spray; Place 2 sprays into both nostrils every 12 (twelve) hours.    No red flags on discussion, patient is not in any obvious distress during our visit. Discussed progression of most viral illness, and recommended supportive care at this point in time.  I prescribed atrovent nasal spray for her to use twice daily and I recommended OTC mucinex. Discussed over the counter supportive care options, with recommendations to push fluids and rest. Reviewed return precautions including new/worsening fever, SOB, new/worsening cough or other concerns.  Recommended need to self-quarantine and practice social distancing until symptoms resolve. I recommend that patient follow-up if symptoms worsen or persist despite treatment x 7-10 days, sooner if needed.  I discussed the assessment and treatment plan with the patient. The patient  was provided an opportunity to ask questions and all were answered. The patient agreed with the plan and demonstrated an understanding of the instructions.   The patient was advised to call back or seek an in-person evaluation if the symptoms worsen or if the condition fails to improve as anticipated.   CMA or LPN served as scribe during this visit. History, Physical, and Plan performed by medical provider. The above documentation has been reviewed and is accurate and complete.   [place], [place] 01/27/2020

## 2020-01-30 ENCOUNTER — Telehealth: Payer: Self-pay

## 2020-01-30 ENCOUNTER — Telehealth: Payer: Self-pay | Admitting: *Deleted

## 2020-01-30 NOTE — Telephone Encounter (Signed)
Pt. Asked to speak to practice administrator due to being unhappy with not being scheduled for an in office visit due to patient being scheduled to have a COVID test done. I explained to the patient she would not be able to be seen in person until her COVID results came back negative. I also explained to her it was recommended per her last visit she needed to self-quarantine and practice social distancing until symptoms resolve.

## 2020-01-30 NOTE — Telephone Encounter (Signed)
Pt called to get an in person visit with her provider and asked what her symptoms were and she stated (coughing and congestion) and was advised that she would have to do a virtual appointment she stated that she was told by Philippa Chester that after she has a visit with the provider on 01/27/2020 that she can come in the office.  Pt requested to speak with Brittney b/c I was not able to set her an in person appointment with her provider (Dr. Ardyth Harps).  Pt was advised that with the symptoms that she has the protocol at [facility] is that the pt have a virtual visit first and then if the provider sees deem for the pt to come in the office they will state it and put it in her chart. Pt was very upset about not being able to be seen stated that her provider is at [facility] and she want to have her listen to her lungs and check out her ears.  Pt stated that she understands and will call the last provider that she saw on 01/27/2020 to see if she can see her in the office.

## 2020-01-30 NOTE — Telephone Encounter (Signed)
Patient called with Covid-like symptoms after her pcp referred her to the [facility]. No available testing appointments with [facility] were available. Found [facility] testing without appointments today at [facility] in [place]. She could not get registration site on her phone. I filled out her registration information so she would be able to walk-in for immediate testing. Verified all demographic information was correct including insurance card information that was needed for registration.  Spent approximately 35 minutes assisting patient.

## 2020-01-31 ENCOUNTER — Ambulatory Visit: Payer: Self-pay

## 2020-02-06 ENCOUNTER — Telehealth: Payer: Self-pay | Admitting: Internal Medicine

## 2020-02-06 ENCOUNTER — Telehealth: Payer: Self-pay

## 2020-02-06 ENCOUNTER — Ambulatory Visit: Payer: Self-pay | Admitting: *Deleted

## 2020-02-06 NOTE — Telephone Encounter (Signed)
Pt is calling in needing a call back about a personal matter that she did not want to elaborate on.  Pt would like to have a call back today if possible.

## 2020-02-06 NOTE — Telephone Encounter (Signed)
Hello, please call pt. Thanks

## 2020-02-06 NOTE — Telephone Encounter (Signed)
Pt called in requesting to have her covid test she had scheduled at [facility] that is the company doing the covid testing at the [facility] in [place].   I let her know they were not part of the [facility] system so I could not cancel that test.   She kept repeating that it was [facility] that scheduled her.   I checked her record and there wasn't any appts listed.  I let her know this.   She was upset that I could not cancel the appt because she kept saying that [facility] scheduled the appt for [facility].   I let her know they were a different company than [place] and that's why the appt is not showing up in her chart and why I can't cancel the test for her.   I offered to give her the web site for [facility] and she replied,  "No I don't want the computer stuff".  She thanked me for my help. She then complained about her dr not seeing her for a sinus infection.   "They won't let me come into the office".    I let her know none of the drs are seeing pts in the office with her symptoms due to covid pandemic.   She complained about the manager at the office being terrible and about them not wanting to see her in the office.    I let her know she would need to talk with the manager at the practice that she goes to to remedy her problem.  She replied,  "I know you are the nurse and don't handle this stuff and I think you anyway for your help".

## 2020-02-06 NOTE — Telephone Encounter (Signed)
Patient is requesting a call back from donna, to discuss how she feels

## 2020-02-06 NOTE — Telephone Encounter (Signed)
Left message on machine for patient returning her cal

## 2020-02-07 NOTE — Telephone Encounter (Signed)
2nd attempt to call patient Left message on machine for patient to return our call. 

## 2020-02-07 NOTE — Telephone Encounter (Signed)
Did you mean to send this to me?

## 2020-02-07 NOTE — Telephone Encounter (Signed)
Yes, I was working on [employer]. She is a patient of the office.

## 2020-02-12 NOTE — Telephone Encounter (Signed)
This is not a [facility] patient.

## 2020-02-25 NOTE — Telephone Encounter (Signed)
The patient was returning Rachel's call  She would like for a return call today please

## 2020-02-26 NOTE — Telephone Encounter (Signed)
Left detailed message on machine for patient to see how she is feeling.

## 2020-05-22 ENCOUNTER — Telehealth: Payer: Self-pay | Admitting: Internal Medicine

## 2020-05-22 ENCOUNTER — Ambulatory Visit: Payer: BC Managed Care – PPO | Admitting: Family Medicine

## 2020-05-22 ENCOUNTER — Encounter: Payer: Self-pay | Admitting: Internal Medicine

## 2020-05-22 NOTE — Telephone Encounter (Signed)
Responded by MyChart

## 2020-05-22 NOTE — Telephone Encounter (Signed)
Patient is calling and wanted to let the provider know that she is experiencing dry mouth and eyes and wanted to see if this was associated with having low Vitamin D. Offered patient an appointment but declined, please advise. CB is [phone]

## 2020-05-31 DIAGNOSIS — U071 COVID-19: Secondary | ICD-10-CM

## 2020-05-31 HISTORY — DX: COVID-19: U07.1

## 2020-06-03 ENCOUNTER — Ambulatory Visit: Payer: Self-pay | Admitting: Internal Medicine

## 2020-06-09 ENCOUNTER — Other Ambulatory Visit (HOSPITAL_COMMUNITY): Payer: Self-pay

## 2020-06-09 ENCOUNTER — Telehealth (INDEPENDENT_AMBULATORY_CARE_PROVIDER_SITE_OTHER): Payer: BC Managed Care – PPO | Admitting: Internal Medicine

## 2020-06-09 DIAGNOSIS — U071 COVID-19: Secondary | ICD-10-CM | POA: Diagnosis not present

## 2020-06-09 MED ORDER — NIRMATRELVIR/RITONAVIR (PAXLOVID)TABLET
2.0000 | ORAL_TABLET | Freq: Two times a day (BID) | ORAL | 0 refills | Status: AC
  Filled 2020-06-09: qty 20, 5d supply, fill #0

## 2020-06-09 NOTE — Progress Notes (Signed)
Virtual Visit via Video Note  I connected with Regina Mendez on 06/09/20 at  9:30 AM EDT by a video enabled telemedicine application and verified that I am speaking with the correct person using two identifiers.  Location patient: home Location provider: work office Persons participating in the virtual visit: patient, provider  I discussed the limitations of evaluation and management by telemedicine and the availability of in person appointments. The patient expressed understanding and agreed to proceed.   HPI: She tested positive for COVID-19 yesterday.  Symptom onset was 2 days ago.  She felt weakness, chills, congestion, a really bad headache and a temperature of 100.5.  She has been taking some NSAIDs for pain relief but just wanted to notify me of this.   ROS: Constitutional: Positive for fever, chills and fatigue.  HEENT: Denies photophobia, eye pain, redness, hearing loss, mouth sores, trouble swallowing, neck pain, neck stiffness and tinnitus.   Respiratory: Denies SOB, DOE, cough, chest tightness,  and wheezing.   Cardiovascular: Denies chest pain, palpitations and leg swelling.  Gastrointestinal: Denies nausea, vomiting, abdominal pain, diarrhea, constipation, blood in stool and abdominal distention.  Genitourinary: Denies dysuria, urgency, frequency, hematuria, flank pain and difficulty urinating.  Endocrine: Denies: hot or cold intolerance, sweats, changes in hair or nails, polyuria, polydipsia. Musculoskeletal: Denies myalgias, back pain, joint swelling, arthralgias and gait problem.  Skin: Denies pallor, rash and wound.  Neurological: Denies dizziness, seizures, syncope, weakness, light-headedness, numbness and headaches.  Hematological: Denies adenopathy. Easy bruising, personal or family bleeding history  Psychiatric/Behavioral: Denies suicidal ideation, mood changes, confusion, nervousness, sleep disturbance and agitation   Past Medical History:  Diagnosis Date   . Acne   . Breast cyst   . Migraine headache     Past Surgical History:  Procedure Laterality Date  . APPENDECTOMY    . BREAST SURGERY     right breast bx  . BREAST SURGERY     Right lumpectomy -dysplastic cells-2018  . COLPOSCOPY    . GYNECOLOGIC CRYOSURGERY  age 30  . OVARIAN CYST SURGERY    . TONSILLECTOMY      Family History  Problem Relation Age of Onset  . Thyroid cancer Mother   . Hypertension Father   . Heart disease Father   . Breast cancer Cousin        Mat. 1st cousins X 2     Age 37's    SOCIAL HX:   reports that she has never smoked. She has never used smokeless tobacco. She reports current alcohol use of about 1.0 standard drink of alcohol per week. She reports that she does not use drugs.   Current Outpatient Medications:  .  BIOTIN PO, Take by mouth., Disp: , Rfl:  .  clindamycin (CLEOCIN-T) 1 % external solution, Apply topically 2 (two) times daily., Disp: 30 mL, Rfl: 5 .  estradiol (ESTRACE) 0.1 MG/GM vaginal cream, Place 1 Applicatorful vaginally at bedtime. After 1 month of use, decrease application to 2 nights per week., Disp: 42.5 g, Rfl: 12 .  ipratropium (ATROVENT) 0.03 % nasal spray, Place 2 sprays into both nostrils every 12 (twelve) hours., Disp: 30 mL, Rfl: 3 .  meloxicam (MOBIC) 7.5 MG tablet, Take 1 tablet (7.5 mg total) by mouth daily., Disp: 30 tablet, Rfl: 6 .  Multiple Vitamins-Minerals (HAIR/SKIN/NAILS PO), Take by mouth., Disp: , Rfl:  .  nirmatrelvir/ritonavir EUA (PAXLOVID) TABS, Take 2 tablets by mouth 2 (two) times daily for 5 days. Patient GFR is 38. Take  nirmatrelvir (150 mg) 3 tablet(s) twice daily for 5 days and ritonavir (100 mg) one tablet twice daily for 5 days., Disp: 20 tablet, Rfl: 0 .  PROAIR HFA 108 (90 Base) MCG/ACT inhaler, USE 2 PUFFS EVERY 6 HOURS AS NEEDED., Disp: 8.5 g, Rfl: 2 .  QVAR REDIHALER 40 MCG/ACT inhaler, INHALE 2 PUFFS INTO THE LUNGS TWICE DAILY, Disp: 8.7 g, Rfl: 2 .  Rhubarb (ESTROVEN MENOPAUSE RELIEF  PO), Take by mouth., Disp: , Rfl:  .  tretinoin (RETIN-A) 0.05 % cream, Apply topically at bedtime., Disp: 45 g, Rfl: 5 .  triamcinolone cream (KENALOG) 0.1 %, Apply 1 application topically 2 (two) times daily., Disp: 30 g, Rfl: 1 .  venlafaxine XR (EFFEXOR-XR) 37.5 MG 24 hr capsule, Take 1 capsule (37.5 mg total) by mouth daily with breakfast., Disp: 30 capsule, Rfl: 2 .  VITAMIN D PO, Take by mouth., Disp: , Rfl:   EXAM:   VITALS per patient if applicable: None reported  GENERAL: alert, oriented, appears well and in no acute distress  HEENT: atraumatic, conjunttiva clear, no obvious abnormalities on inspection of external nose and ears  NECK: normal movements of the head and neck  LUNGS: on inspection no signs of respiratory distress, breathing rate appears normal, no obvious gross increased work of breathing, gasping or wheezing  CV: no obvious cyanosis  MS: moves all visible extremities without noticeable abnormality  PSYCH/NEURO: pleasant and cooperative, no obvious depression or anxiety, speech and thought processing grossly intact  ASSESSMENT AND PLAN:   COVID-19  - Plan: nirmatrelvir/ritonavir EUA (PAXLOVID) TABS -Symptoms appear mild at this time. -She is advised a strict 5-day quarantine followed by an additional 5 days where if she must leave the house that she must wear a mask. -She knows to contact me for symptom progression and to visit the emergency department for severe symptoms.     I discussed the assessment and treatment plan with the patient. The patient was provided an opportunity to ask questions and all were answered. The patient agreed with the plan and demonstrated an understanding of the instructions.   The patient was advised to call back or seek an in-person evaluation if the symptoms worsen or if the condition fails to improve as anticipated.    Lelon Frohlich, MD  [facility]

## 2020-07-14 ENCOUNTER — Other Ambulatory Visit: Payer: Self-pay

## 2020-07-15 ENCOUNTER — Ambulatory Visit (INDEPENDENT_AMBULATORY_CARE_PROVIDER_SITE_OTHER): Payer: BC Managed Care – PPO | Admitting: Internal Medicine

## 2020-07-15 ENCOUNTER — Other Ambulatory Visit: Payer: Self-pay | Admitting: Internal Medicine

## 2020-07-15 ENCOUNTER — Encounter: Payer: Self-pay | Admitting: Internal Medicine

## 2020-07-15 VITALS — BP 110/60 | HR 80 | Temp 98.1°F | Ht 64.0 in | Wt 132.6 lb

## 2020-07-15 DIAGNOSIS — Z23 Encounter for immunization: Secondary | ICD-10-CM

## 2020-07-15 DIAGNOSIS — E559 Vitamin D deficiency, unspecified: Secondary | ICD-10-CM

## 2020-07-15 DIAGNOSIS — E785 Hyperlipidemia, unspecified: Secondary | ICD-10-CM | POA: Insufficient documentation

## 2020-07-15 DIAGNOSIS — Z86010 Personal history of colonic polyps: Secondary | ICD-10-CM

## 2020-07-15 DIAGNOSIS — R7302 Impaired glucose tolerance (oral): Secondary | ICD-10-CM | POA: Insufficient documentation

## 2020-07-15 DIAGNOSIS — E7800 Pure hypercholesterolemia, unspecified: Secondary | ICD-10-CM

## 2020-07-15 DIAGNOSIS — Z0000 Encounter for general adult medical examination without abnormal findings: Secondary | ICD-10-CM

## 2020-07-15 DIAGNOSIS — Z9189 Other specified personal risk factors, not elsewhere classified: Secondary | ICD-10-CM | POA: Diagnosis not present

## 2020-07-15 LAB — VITAMIN D 25 HYDROXY (VIT D DEFICIENCY, FRACTURES): VITD: 56.14 ng/mL (ref 30.00–100.00)

## 2020-07-15 LAB — CBC WITH DIFFERENTIAL/PLATELET
Basophils Absolute: 0 10*3/uL (ref 0.0–0.1)
Basophils Relative: 0.6 % (ref 0.0–3.0)
Eosinophils Absolute: 0.2 10*3/uL (ref 0.0–0.7)
Eosinophils Relative: 3.2 % (ref 0.0–5.0)
HCT: 38.4 % (ref 36.0–46.0)
Hemoglobin: 13.1 g/dL (ref 12.0–15.0)
Lymphocytes Relative: 32.9 % (ref 12.0–46.0)
Lymphs Abs: 2.1 10*3/uL (ref 0.7–4.0)
MCHC: 34.2 g/dL (ref 30.0–36.0)
MCV: 92.9 fl (ref 78.0–100.0)
Monocytes Absolute: 0.7 10*3/uL (ref 0.1–1.0)
Monocytes Relative: 11.4 % (ref 3.0–12.0)
Neutro Abs: 3.3 10*3/uL (ref 1.4–7.7)
Neutrophils Relative %: 51.9 % (ref 43.0–77.0)
Platelets: 238 10*3/uL (ref 150.0–400.0)
RBC: 4.13 Mil/uL (ref 3.87–5.11)
RDW: 13 % (ref 11.5–15.5)
WBC: 6.4 10*3/uL (ref 4.0–10.5)

## 2020-07-15 LAB — COMPREHENSIVE METABOLIC PANEL
ALT: 12 U/L (ref 0–35)
AST: 18 U/L (ref 0–37)
Albumin: 4.1 g/dL (ref 3.5–5.2)
Alkaline Phosphatase: 50 U/L (ref 39–117)
BUN: 10 mg/dL (ref 6–23)
CO2: 29 mEq/L (ref 19–32)
Calcium: 9.4 mg/dL (ref 8.4–10.5)
Chloride: 104 mEq/L (ref 96–112)
Creatinine, Ser: 0.84 mg/dL (ref 0.40–1.20)
GFR: 76.15 mL/min (ref >60.00)
Glucose, Bld: 106 mg/dL — ABNORMAL HIGH (ref 70–99)
Potassium: 4.2 mEq/L (ref 3.5–5.1)
Sodium: 139 mEq/L (ref 135–145)
Total Bilirubin: 0.6 mg/dL (ref 0.2–1.2)
Total Protein: 7.2 g/dL (ref 6.0–8.3)

## 2020-07-15 LAB — VITAMIN B12: Vitamin B-12: 1223 pg/mL — ABNORMAL HIGH (ref 211–911)

## 2020-07-15 LAB — LIPID PANEL
Cholesterol: 221 mg/dL — ABNORMAL HIGH (ref 0–200)
HDL: 45.3 mg/dL (ref >39.00)
LDL Cholesterol: 146 mg/dL — ABNORMAL HIGH (ref 0–99)
NonHDL: 175.2
Total CHOL/HDL Ratio: 5
Triglycerides: 147 mg/dL (ref 0.0–149.0)
VLDL: 29.4 mg/dL (ref 0.0–40.0)

## 2020-07-15 LAB — HEMOGLOBIN A1C: Hgb A1c MFr Bld: 5.9 % (ref 4.6–6.5)

## 2020-07-15 MED ORDER — ATORVASTATIN CALCIUM 20 MG PO TABS: 20.0000 mg | ORAL_TABLET | Freq: Every day | ORAL | 1 refills | Status: DC

## 2020-07-15 NOTE — Progress Notes (Signed)
Established Patient Office Visit     This visit occurred during the SARS-CoV-2 public health emergency.  Safety protocols were in place, including screening questions prior to the visit, additional usage of staff PPE, and extensive cleaning of exam room while observing appropriate contact time as indicated for disinfecting solutions.    CC/Reason for Visit: Annual preventive exam  HPI: Regina Mendez is a 60 y.o. female who is coming in today for the above mentioned reasons. Past Medical History is significant for: Cervical dysplasia followed by GYN, high risk breast cancer due to family history and a breast lump with dysplasia, she tells me that her previous breast cancer specialist has retired and is needing a new one.  She has routine eye and dental care.  Recently she has been having extreme dry mouth and eyes.  She has been seeing rheumatology and work-up is currently pending.  There is concern for Sjogren's syndrome.  She is requesting referral to dermatology for routine skin exam.  She has routine eye and dental care.  She has just started back with exercising.  She is due to have her fourth COVID-vaccine and her second shingles, her Tdap was in 2015.  She had a colonoscopy in 2019 and was advised 10-year follow-up.   Past Medical/Surgical History: Past Medical History:  Diagnosis Date   Acne    Breast cyst    Migraine headache     Past Surgical History:  Procedure Laterality Date   APPENDECTOMY     BREAST SURGERY     right breast bx   BREAST SURGERY     Right lumpectomy -dysplastic cells-2018   COLPOSCOPY     GYNECOLOGIC CRYOSURGERY  age 53   OVARIAN CYST SURGERY     TONSILLECTOMY      Social History:  reports that she has never smoked. She has never used smokeless tobacco. She reports current alcohol use of about 1.0 standard drink of alcohol per week. She reports that she does not use drugs.  Allergies: Allergies  Allergen Reactions   Codeine Other (See  Comments)    Drowsy   Sulfonamide Derivatives Dermatitis    Family History:  Family History  Problem Relation Age of Onset   Thyroid cancer Mother    Hypertension Father    Heart disease Father    Breast cancer Cousin        Mat. 1st cousins X 2     Age 49's     Current Outpatient Medications:    BIOTIN PO, Take by mouth., Disp: , Rfl:    clindamycin (CLEOCIN-T) 1 % external solution, Apply topically 2 (two) times daily., Disp: 30 mL, Rfl: 5   estradiol (ESTRACE) 0.1 MG/GM vaginal cream, Place 1 Applicatorful vaginally at bedtime. After 1 month of use, decrease application to 2 nights per week., Disp: 42.5 g, Rfl: 12   ipratropium (ATROVENT) 0.03 % nasal spray, Place 2 sprays into both nostrils every 12 (twelve) hours., Disp: 30 mL, Rfl: 3   meloxicam (MOBIC) 7.5 MG tablet, Take 1 tablet (7.5 mg total) by mouth daily., Disp: 30 tablet, Rfl: 6   Multiple Vitamins-Minerals (HAIR/SKIN/NAILS PO), Take by mouth., Disp: , Rfl:    PROAIR HFA 108 (90 Base) MCG/ACT inhaler, USE 2 PUFFS EVERY 6 HOURS AS NEEDED., Disp: 8.5 g, Rfl: 2   QVAR REDIHALER 40 MCG/ACT inhaler, INHALE 2 PUFFS INTO THE LUNGS TWICE DAILY, Disp: 8.7 g, Rfl: 2   Rhubarb (ESTROVEN MENOPAUSE RELIEF PO), Take by mouth., Disp: ,  Rfl:    tretinoin (RETIN-A) 0.05 % cream, Apply topically at bedtime., Disp: 45 g, Rfl: 5   triamcinolone cream (KENALOG) 0.1 %, Apply 1 application topically 2 (two) times daily., Disp: 30 g, Rfl: 1   venlafaxine XR (EFFEXOR-XR) 37.5 MG 24 hr capsule, Take 1 capsule (37.5 mg total) by mouth daily with breakfast., Disp: 30 capsule, Rfl: 2   VITAMIN D PO, Take by mouth., Disp: , Rfl:   Review of Systems:  Constitutional: Denies fever, chills, diaphoresis, appetite change and fatigue.  HEENT: Denies photophobia, eye pain, redness, hearing loss, ear pain, congestion, sore throat, rhinorrhea, sneezing, mouth sores, trouble swallowing, neck pain, neck stiffness and tinnitus.   Respiratory: Denies SOB, DOE,  cough, chest tightness,  and wheezing.   Cardiovascular: Denies chest pain, palpitations and leg swelling.  Gastrointestinal: Denies nausea, vomiting, abdominal pain, diarrhea, constipation, blood in stool and abdominal distention.  Genitourinary: Denies dysuria, urgency, frequency, hematuria, flank pain and difficulty urinating.  Endocrine: Denies: hot or cold intolerance, sweats, changes in hair or nails, polyuria, polydipsia. Musculoskeletal: Denies myalgias, back pain, joint swelling, arthralgias and gait problem.  Skin: Denies pallor, rash and wound.  Neurological: Denies dizziness, seizures, syncope, weakness, light-headedness, numbness and headaches.  Hematological: Denies adenopathy. Easy bruising, personal or family bleeding history  Psychiatric/Behavioral: Denies suicidal ideation, mood changes, confusion, nervousness, sleep disturbance and agitation    Physical Exam: Vitals:   07/15/20 0813  BP: 110/60  Pulse: 80  Temp: 98.1 F (36.7 C)  TempSrc: Oral  SpO2: 96%  Weight: 132 lb 9.6 oz (60.1 kg)  Height: 5\' 4"  (1.626 m)    Body mass index is 22.76 kg/m.   Constitutional: NAD, calm, comfortable Eyes: PERRL, lids and conjunctivae normal, wears corrective lenses ENMT: Mucous membranes are dry. Posterior pharynx clear of any exudate or lesions.,  Geographic tongue is present.  Tympanic membrane is pearly white, no erythema or bulging. Neck: normal, supple, no masses, no thyromegaly Respiratory: clear to auscultation bilaterally, no wheezing, no crackles. Normal respiratory effort. No accessory muscle use.  Cardiovascular: Regular rate and rhythm, no murmurs / rubs / gallops. No extremity edema. 2+ pedal pulses. No carotid bruits.  Abdomen: no tenderness, no masses palpated. No hepatosplenomegaly. Bowel sounds positive.  Musculoskeletal: no clubbing / cyanosis. No joint deformity upper and lower extremities. Good ROM, no contractures. Normal muscle tone.  Skin: no rashes,  lesions, ulcers. No induration Neurologic: CN 2-12 grossly intact. Sensation intact, DTR normal. Strength 5/5 in all 4.  Psychiatric: Normal judgment and insight. Alert and oriented x 3. Normal mood.    Impression and Plan:  Need for shingles vaccine  - Plan: Varicella-zoster vaccine IM  Encounter for preventive health examination  - Plan: CBC with Differential/Platelet, Comprehensive metabolic panel, Hemoglobin A1c, Lipid panel, Vitamin B12 -She has routine eye and dental care. -Shingles vaccine today, she will also get her second COVID booster at her pharmacy, otherwise immunizations are up-to-date. -Screening labs today. -Healthy lifestyle discussed in detail. -She is due for follow-up with GYN and this has been scheduled. -She had a colonoscopy in 2019 and is a 10-year callback. -Will refer to high risk breast cancer clinic given her history for determination on appropriate intervals and methods for testing.  At high risk for breast cancer -  Plan: Ambulatory referral to [facility] -She tells me that in the past yearly breast MRI was recommended and she was due to have this last January.  Vitamin D deficiency  - Plan: VITAMIN  D 25 Hydroxy (Vit-D Deficiency, Fractures)  History of colon polyps -Last colonoscopy was in 2019 and 10-year follow-up was recommended.   Patient Instructions  -Nice seeing you today!!  -Lab work today; will notify you once results are available.  -Second shingles vaccine today.  -Remember your 4th COVID vaccine at the pharmacy.  -Schedule follow up in 6 months.    Lelon Frohlich, MD [facility] at [facility]

## 2020-07-15 NOTE — Patient Instructions (Signed)
-  Nice seeing you today!!  -Lab work today; will notify you once results are available.  -Second shingles vaccine today.  -Remember your 4th COVID vaccine at the pharmacy.  -Schedule follow up in 6 months.

## 2020-07-16 ENCOUNTER — Telehealth: Payer: Self-pay | Admitting: Internal Medicine

## 2020-07-16 NOTE — Telephone Encounter (Signed)
Per Tillie Rung she spoke with the patient and informed her of the results.

## 2020-07-16 NOTE — Telephone Encounter (Signed)
Pt call and stated she is returning your call and want a call back. 

## 2020-07-21 ENCOUNTER — Other Ambulatory Visit: Payer: Self-pay

## 2020-07-22 ENCOUNTER — Ambulatory Visit: Payer: BC Managed Care – PPO | Admitting: Internal Medicine

## 2020-07-22 ENCOUNTER — Encounter: Payer: Self-pay | Admitting: Internal Medicine

## 2020-07-22 DIAGNOSIS — E7800 Pure hypercholesterolemia, unspecified: Secondary | ICD-10-CM | POA: Diagnosis not present

## 2020-07-22 DIAGNOSIS — K146 Glossodynia: Secondary | ICD-10-CM | POA: Diagnosis not present

## 2020-07-22 DIAGNOSIS — R7302 Impaired glucose tolerance (oral): Secondary | ICD-10-CM

## 2020-07-22 DIAGNOSIS — Z1283 Encounter for screening for malignant neoplasm of skin: Secondary | ICD-10-CM

## 2020-07-22 NOTE — Progress Notes (Signed)
Established Patient Office Visit     This visit occurred during the SARS-CoV-2 public health emergency.  Safety protocols were in place, including screening questions prior to the visit, additional usage of staff PPE, and extensive cleaning of exam room while observing appropriate contact time as indicated for disinfecting solutions.    CC/Reason for Visit: Discuss lab results and some acute concerns  HPI: Elfa Wooton is a 60 y.o. female who is coming in today for the above mentioned reasons.  She was seen recently for her physical.  She has questions about her labs.  Wonders why she was prescribed cholesterol medication.  She would like to know what implications being a prediabetic has and if she could have a dietitian referral.  She has also been having some significant tongue burning feels scalded and dry.  This has been going on for some time.  She saw rheumatology thinking she might have Sjogren's syndrome.  I do not have reports from this consultation.  She saw a dentist who did agree that the tongue was significantly inflamed.  She is requesting ENT referral.  She is also requesting dermatology referral for some hyperpigmentation of the skin that she has noticed in multiple freckles and moles.  Past Medical/Surgical History: Past Medical History:  Diagnosis Date   Acne    Breast cyst    Migraine headache     Past Surgical History:  Procedure Laterality Date   APPENDECTOMY     BREAST SURGERY     right breast bx   BREAST SURGERY     Right lumpectomy -dysplastic cells-2018   COLPOSCOPY     GYNECOLOGIC CRYOSURGERY  age 21   OVARIAN CYST SURGERY     TONSILLECTOMY      Social History:  reports that she has never smoked. She has never used smokeless tobacco. She reports current alcohol use of about 1.0 standard drink of alcohol per week. She reports that she does not use drugs.  Allergies: Allergies  Allergen Reactions   Codeine Other (See Comments)    Drowsy    Sulfonamide Derivatives Dermatitis    Family History:  Family History  Problem Relation Age of Onset   Thyroid cancer Mother    Hypertension Father    Heart disease Father    Breast cancer Cousin        Mat. 1st cousins X 2     Age 32's     Current Outpatient Medications:    BIOTIN PO, Take by mouth., Disp: , Rfl:    clindamycin (CLEOCIN-T) 1 % external solution, Apply topically 2 (two) times daily., Disp: 30 mL, Rfl: 5   estradiol (ESTRACE) 0.1 MG/GM vaginal cream, Place 1 Applicatorful vaginally at bedtime. After 1 month of use, decrease application to 2 nights per week., Disp: 42.5 g, Rfl: 12   ipratropium (ATROVENT) 0.03 % nasal spray, Place 2 sprays into both nostrils every 12 (twelve) hours., Disp: 30 mL, Rfl: 3   meloxicam (MOBIC) 7.5 MG tablet, Take 1 tablet (7.5 mg total) by mouth daily., Disp: 30 tablet, Rfl: 6   Multiple Vitamins-Minerals (HAIR/SKIN/NAILS PO), Take by mouth., Disp: , Rfl:    PROAIR HFA 108 (90 Base) MCG/ACT inhaler, USE 2 PUFFS EVERY 6 HOURS AS NEEDED., Disp: 8.5 g, Rfl: 2   QVAR REDIHALER 40 MCG/ACT inhaler, INHALE 2 PUFFS INTO THE LUNGS TWICE DAILY, Disp: 8.7 g, Rfl: 2   Rhubarb (ESTROVEN MENOPAUSE RELIEF PO), Take by mouth., Disp: , Rfl:    tretinoin (  RETIN-A) 0.05 % cream, Apply topically at bedtime., Disp: 45 g, Rfl: 5   triamcinolone cream (KENALOG) 0.1 %, Apply 1 application topically 2 (two) times daily., Disp: 30 g, Rfl: 1   venlafaxine XR (EFFEXOR-XR) 37.5 MG 24 hr capsule, Take 1 capsule (37.5 mg total) by mouth daily with breakfast., Disp: 30 capsule, Rfl: 2   VITAMIN D PO, Take by mouth., Disp: , Rfl:    atorvastatin (LIPITOR) 20 MG tablet, Take 1 tablet (20 mg total) by mouth daily. (Patient not taking: Reported on 07/22/2020), Disp: 90 tablet, Rfl: 1  Review of Systems:  Constitutional: Denies fever, chills, diaphoresis, appetite change and fatigue.  HEENT: Denies photophobia, eye pain, redness, hearing loss, ear pain, congestion, sore throat,  rhinorrhea, sneezing, neck pain, neck stiffness and tinnitus.   Respiratory: Denies SOB, DOE, cough, chest tightness,  and wheezing.   Cardiovascular: Denies chest pain, palpitations and leg swelling.  Gastrointestinal: Denies nausea, vomiting, abdominal pain, diarrhea, constipation, blood in stool and abdominal distention.  Genitourinary: Denies dysuria, urgency, frequency, hematuria, flank pain and difficulty urinating.  Endocrine: Denies: hot or cold intolerance, sweats, changes in hair or nails, polyuria, polydipsia. Musculoskeletal: Denies myalgias, back pain, joint swelling, arthralgias and gait problem.  Skin: Denies pallor, rash and wound.  Neurological: Denies dizziness, seizures, syncope, weakness, light-headedness, numbness and headaches.  Hematological: Denies adenopathy. Easy bruising, personal or family bleeding history  Psychiatric/Behavioral: Denies suicidal ideation, mood changes, confusion, nervousness, sleep disturbance and agitation    Physical Exam: Vitals:   07/22/20 1303  BP: 120/78  Pulse: 77  Temp: 98.5 F (36.9 C)  TempSrc: Oral  SpO2: 99%  Weight: 131 lb 8 oz (59.6 kg)    Body mass index is 22.57 kg/m.   Constitutional: NAD, calm, comfortable Eyes: PERRL, lids and conjunctivae normal, wears corrective lenses ENMT: Mucous membranes are quite dry.   Skin: Multiple freckles and moles Neurologic: Grossly intact and nonfocal Psychiatric: Normal judgment and insight. Alert and oriented x 3. Normal mood.    Impression and Plan:  Pure hypercholesterolemia  - Plan: Amb ref to Medical Nutrition Therapy-MNT -She agrees to starting statin therapy. -Total cholesterol 221, triglycerides 147, LDL 146, this has been her lipid panel for the last 5 to 6 years.  IGT (impaired glucose tolerance)  - Plan: Amb ref to Medical Nutrition Therapy-MNT  Tongue burning sensation  - Plan: Ambulatory referral to ENT -Unclear diagnosis, would like to see rheumatology  notes.  Screening exam for skin cancer  - Plan: Ambulatory referral to Dermatology     Elfego Giammarino Isaac Bliss, MD [facility] at [facility]

## 2020-08-10 NOTE — Progress Notes (Signed)
60 y.o. [id number] Married  female here for annual exam.    Doing breast MRI every 2 years.  She has 33% lifetime risk of breast cancer.   Experiencing vaginal dryness.  She is not using local vaginal estrogen treatment.   Had Covid in May.  Fatigue.   A1C 5.9.   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   PCP:   Lelon Frohlich, MD  Patient's last menstrual period was 08/13/2012.           Sexually active: No.  The current method of family planning is none.    Exercising: Yes.    Home exercise.  Smoker:  no  Health Maintenance: Pap:  05-09-17 normal Neg HPV History of abnormal Pap:  yes, cryotherapy.  Follow up paps normal.  Colposcopic biopsy report in Epic for 08/17/00  showing LGSIL. MMG:  12-17-15 Rt. 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 Mammogram 07/15/19- [facility] - BI-RADS1.  Breast MRI 07/15/19 - [facility] - BI-RADS2, small bilateral benign breast cysts. Colonoscopy:  05-04-17 BMD:   2010  Result  Normal TDaP:  Unsure Gardasil:   no HIV:neg Hep C:neg Screening Labs:  Hb today: PCP, Urine today: PCP   reports that she has never smoked. She has never used smokeless tobacco. She reports current alcohol use of about 1.0 standard drink of alcohol per week. She reports that she does not use drugs.  Past Medical History:  Diagnosis Date   Acne    Breast cyst    COVID 05/2020   Migraine headache     Past Surgical History:  Procedure Laterality Date   APPENDECTOMY     BREAST SURGERY     right breast bx   BREAST SURGERY     Right lumpectomy -dysplastic cells-2018   COLPOSCOPY     GYNECOLOGIC CRYOSURGERY  age 79   OVARIAN CYST SURGERY     TONSILLECTOMY      Current Outpatient Medications  Medication Sig Dispense Refill   BIOTIN PO Take by mouth.     clindamycin (CLEOCIN-T) 1 % external solution Apply topically 2 (two) times daily. 30 mL 5   ipratropium (ATROVENT) 0.03 %  nasal spray Place 2 sprays into both nostrils every 12 (twelve) hours. 30 mL 3   meloxicam (MOBIC) 7.5 MG tablet Take 1 tablet (7.5 mg total) by mouth daily. 30 tablet 6   Multiple Vitamins-Minerals (HAIR/SKIN/NAILS PO) Take by mouth.     PROAIR HFA 108 (90 Base) MCG/ACT inhaler USE 2 PUFFS EVERY 6 HOURS AS NEEDED. 8.5 g 2   QVAR REDIHALER 40 MCG/ACT inhaler INHALE 2 PUFFS INTO THE LUNGS TWICE DAILY 8.7 g 2   tretinoin (RETIN-A) 0.05 % cream Apply topically at bedtime. 45 g 5   triamcinolone cream (KENALOG) 0.1 % Apply 1 application topically 2 (two) times daily. 30 g 1   VITAMIN D PO Take by mouth.     atorvastatin (LIPITOR) 20 MG tablet Take 1 tablet (20 mg total) by mouth daily. (Patient not taking: No sig reported) 90 tablet 1   No current facility-administered medications for this visit.    Family History  Problem Relation Age of Onset   Thyroid cancer Mother    Hypertension Father    Heart disease Father    Breast cancer Cousin        Mat. 1st cousins X 2     Age 68's    Review of Systems  Genitourinary:  Vaginal dryness   Exam:   BP 118/76 (BP Location: Right Arm, Patient Position: Sitting, Cuff Size: Normal)   Pulse 68   Ht 5\' 5"  (1.651 m)   Wt 129 lb (58.5 kg)   LMP 08/13/2012 Comment: gyn  SpO2 98%   BMI 21.47 kg/m     General appearance: alert, cooperative and appears stated age Head: normocephalic, without obvious abnormality, atraumatic Neck: no adenopathy, supple, symmetrical, trachea midline and thyroid normal to inspection and palpation Lungs: clear to auscultation bilaterally Breasts: normal appearance, no masses or tenderness, No nipple retraction or dimpling, No nipple discharge or bleeding, No axillary adenopathy Heart: regular rate and rhythm Abdomen: soft, non-tender; no masses, no organomegaly Extremities: extremities normal, atraumatic, no cyanosis or edema Skin: skin color, texture, turgor normal. No rashes or lesions Lymph nodes: cervical,  supraclavicular, and axillary nodes normal. Neurologic: grossly normal  Pelvic: External genitalia:  no lesions              No abnormal inguinal nodes palpated.              Urethra:  normal appearing urethra with no masses, tenderness or lesions              Bartholins and Skenes: normal                 Vagina: normal appearing vagina with normal color and discharge, no lesions              Cervix: no lesions              Pap taken: no. Bimanual Exam:  Uterus:  normal size, contour, position, consistency, mobility, non-tender              Adnexa: no mass, fullness, tenderness.  Right band felt internally.               Rectal exam: yes. Confirms.              Anus:  normal sphincter tone, no lesions  Chaperone was present for exam.  Assessment:   Well woman visit with normal exam. 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 Status post excisional biopsy 02/22/16 showing [place] and focal atypia. Increased lifetime risk of breast cancer, 33% by Tyrer-Cuzick model. Negative genetic testing.  (See [place] noted from 04/10/18.) Vaginal atrophy symptoms.  Remote history of cervical cryotherapy.   Plan: Mammogram screening discussed.  She will schedule.   Self breast awareness reviewed. Pap and HR HPV 2024. Guidelines for Calcium, Vitamin D, regular exercise program including cardiovascular and weight bearing exercise. I agree with her not using vaginal estrogens.  For atrophy, I would recommend water based lubricants, cooking oils, or vaginal vitamin E suppositories.  Labs with PCP.  Next BMD can be done closer to age 33 yo. Follow up annually and prn.   After visit summary provided.

## 2020-08-14 ENCOUNTER — Other Ambulatory Visit: Payer: Self-pay

## 2020-08-14 ENCOUNTER — Encounter: Payer: Self-pay | Admitting: Obstetrics and Gynecology

## 2020-08-14 ENCOUNTER — Ambulatory Visit (INDEPENDENT_AMBULATORY_CARE_PROVIDER_SITE_OTHER): Payer: BC Managed Care – PPO | Admitting: Obstetrics and Gynecology

## 2020-08-14 VITALS — BP 118/76 | HR 68 | Ht 65.0 in | Wt 129.0 lb

## 2020-08-14 DIAGNOSIS — Z01419 Encounter for gynecological examination (general) (routine) without abnormal findings: Secondary | ICD-10-CM | POA: Diagnosis not present

## 2020-08-14 NOTE — Patient Instructions (Addendum)

## 2020-08-16 ENCOUNTER — Encounter: Payer: Self-pay | Admitting: Obstetrics and Gynecology

## 2020-09-08 ENCOUNTER — Encounter: Payer: Self-pay | Admitting: Internal Medicine

## 2020-09-08 DIAGNOSIS — M2550 Pain in unspecified joint: Secondary | ICD-10-CM

## 2020-09-08 NOTE — Telephone Encounter (Signed)
Okay to refer? 

## 2020-11-05 ENCOUNTER — Ambulatory Visit: Payer: BC Managed Care – PPO | Admitting: Obstetrics and Gynecology

## 2020-11-12 ENCOUNTER — Other Ambulatory Visit: Payer: Self-pay | Admitting: Internal Medicine

## 2020-11-12 ENCOUNTER — Other Ambulatory Visit: Payer: Self-pay

## 2020-11-12 ENCOUNTER — Ambulatory Visit
Admission: RE | Admit: 2020-11-12 | Discharge: 2020-11-12 | Disposition: A | Discharge status: Home / Self Care | Payer: BC Managed Care – PPO | Source: Ambulatory Visit | Attending: Internal Medicine | Admitting: Internal Medicine

## 2020-11-12 DIAGNOSIS — Z1231 Encounter for screening mammogram for malignant neoplasm of breast: Secondary | ICD-10-CM

## 2020-11-20 ENCOUNTER — Encounter: Payer: Self-pay | Admitting: Internal Medicine

## 2020-11-24 ENCOUNTER — Ambulatory Visit: Payer: BC Managed Care – PPO

## 2020-12-03 ENCOUNTER — Encounter: Payer: Self-pay | Admitting: Internal Medicine

## 2021-04-02 ENCOUNTER — Telehealth: Payer: Self-pay | Admitting: *Deleted

## 2021-04-02 NOTE — Telephone Encounter (Signed)
I reviewed the visit and I do not see a recalculation of her lifetime risk of breast cancer.  ? ?She it not a good candidate for estrogens due to her history of breast atypia noted on biopsy.  ? ?I would recommend she use other methods to create hydration for the vaginal such as water based lubricants, cooking oils, and vitamin E suppositories (which she can purchase on line on [employer]).  ?

## 2021-04-02 NOTE — Telephone Encounter (Signed)
Patient told appointments she doesn't want to schedule an office visit. I called patient and she said Dr. Clovis Riley ([facility] provider) said her risk is lower now (history of breast cancer) patient asked since risk is lower now if she could use vaginal cream every once in a while? If yes, she would like Rx.  ?

## 2021-04-02 NOTE — Telephone Encounter (Signed)
Patient informed with all the below. ? ?Patient said Dr.Levine notes are in epic most current visit is 09/19/20. Patient said she didn't approval for estrogen she asked him about taking it and said he told patient to follow up with GYN regarding this. Patient said she only mentioned the estrogen because Dr.Levine re-calculated her risk and it was lower then before. Patient said if you don't agree with her starting estrogen she won't take it. She thought it was worth asking since the risk was re-calculated.  Please advise  ?

## 2021-04-02 NOTE — Telephone Encounter (Signed)
Patient informed. 

## 2021-04-02 NOTE — Telephone Encounter (Signed)
I would like to have something written from Dr. Clovis Riley regarding approval of use of vaginal estrogen due to patient's history of breast biopsy showing atypia and her increased lifetime risk of breast cancer.  ? ?An office visit is recommended.  ?

## 2021-04-02 NOTE — Telephone Encounter (Signed)
Patient called in triage voicemail requesting to start on vaginal estradiol cream. Message sent to appointments to schedule office visit with Dr.Silva.  ?

## 2021-06-14 ENCOUNTER — Telehealth: Payer: Self-pay | Admitting: *Deleted

## 2021-06-14 DIAGNOSIS — Z9189 Other specified personal risk factors, not elsewhere classified: Secondary | ICD-10-CM

## 2021-06-14 NOTE — Telephone Encounter (Signed)
Thank you for the clarification.  ? ?I see now that Dr. Georgina Snell note does state her risk may be lower than the 33%. However, it is not re-calculated, so there is no other specific updated percentage in her chart.   ? ?Please ask her if she wants me to order the breast MRI here in [place] or if she wishes to see Dr. Clovis Riley yearly and have him order it. ?He is a surgical oncology specialist at [facility]  ? ? ?

## 2021-06-14 NOTE — Telephone Encounter (Signed)
I informed patient with below note. Patient reports that Dr. Clovis Riley (general surgeon notes in epic) states she is no longer over 30% increased risk. Patient said she had MRI done with [facility] on 07/15/19 its located in care everywhere. Patient reports she would like both providers to be on the same page. She did tell me to place the order ?

## 2021-06-14 NOTE — Telephone Encounter (Signed)
Per last last mammogram 10/22: ? ?"Hello Regina Mendez,  ?  ?Good news that your mammogram is normal! ?  ?There is no sign of breast cancer. ?  ?You are a candidate to do yearly breast MRI due to your increased risk of breast cancer.   ?  ?Please let me know if you need anything.  ?  ?Josefa Half, MD" ? ? ?Patient said she does breast Mri this year when should it be scheduled? Please advise  ?

## 2021-06-14 NOTE — Telephone Encounter (Signed)
She may proceed with her breast MRI with contrast now.  ?The diagnosis is increased risk of breast cancer at 33%. ?  ?

## 2021-06-15 NOTE — Telephone Encounter (Signed)
Patient informed, she would like you to order MRI. Order placed.  ?

## 2021-06-17 ENCOUNTER — Encounter: Payer: Self-pay | Admitting: Internal Medicine

## 2021-06-18 ENCOUNTER — Encounter: Payer: Self-pay | Admitting: Family Medicine

## 2021-06-18 ENCOUNTER — Ambulatory Visit (INDEPENDENT_AMBULATORY_CARE_PROVIDER_SITE_OTHER): Payer: BC Managed Care – PPO

## 2021-06-18 ENCOUNTER — Ambulatory Visit: Payer: BC Managed Care – PPO | Admitting: Family Medicine

## 2021-06-18 VITALS — BP 108/68 | HR 80 | Temp 97.8°F | Ht 65.0 in | Wt 121.8 lb

## 2021-06-18 DIAGNOSIS — Z23 Encounter for immunization: Secondary | ICD-10-CM

## 2021-06-18 DIAGNOSIS — S60222A Contusion of left hand, initial encounter: Secondary | ICD-10-CM

## 2021-06-18 NOTE — Progress Notes (Signed)
   Established Patient Office Visit  Subjective   Patient ID: Regina Mendez, female    DOB: 30-Dec-1960  Age: 61 y.o. MRN: [id number]  Chief Complaint  Patient presents with   Injury    Injured fingers today, slammed in car door little swelling.     Injury Pertinent negatives include no tingling or weakness.  for evaluation of crush injury to left fourth and fifth fingers earlier today with a car door.  She saw stars.  They are doing better now.  Last Tdap was 8 years ago.  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    Review of Systems  Constitutional: Negative.   Respiratory: Negative.    Cardiovascular: Negative.   Musculoskeletal:  Positive for joint pain.  Neurological:  Negative for tingling and weakness.     Objective:     BP 108/68 (BP Location: Left Arm, Patient Position: Sitting, Cuff Size: Normal)   Pulse 80   Temp 97.8 F (36.6 C) (Temporal)   Ht '5\' 5"'$  (1.651 m)   Wt 121 lb 12.8 oz (55.2 kg)   LMP 08/13/2012 Comment: gyn  SpO2 98%   BMI 20.27 kg/m    Physical Exam Constitutional:      General: She is not in acute distress.    Appearance: Normal appearance. She is not ill-appearing, toxic-appearing or diaphoretic.  HENT:     Head: Normocephalic and atraumatic.     Right Ear: External ear normal.     Left Ear: External ear normal.  Eyes:     General: No scleral icterus.       Right eye: No discharge.        Left eye: No discharge.     Extraocular Movements: Extraocular movements intact.     Conjunctiva/sclera: Conjunctivae normal.  Pulmonary:     Effort: Pulmonary effort is normal. No respiratory distress.  Musculoskeletal:     Left hand: No tenderness or bony tenderness. Normal range of motion.       Arms:  Skin:    General: Skin is warm and dry.  Neurological:     Mental Status: She is alert and oriented to person, place, and time.  Psychiatric:        Mood and Affect: Mood normal.        Behavior: Behavior normal.     No results  found for any visits on 06/18/21.    The 10-year ASCVD risk score (Arnett DK, et al., 2019) is: 6.1%    Assessment & Plan:   Problem List Items Addressed This Visit       Other   Need for Tdap vaccination   Relevant Orders   Tdap vaccine greater than or equal to 7yo IM (Completed)   Contusion of left hand - Primary   Relevant Orders   DG Hand Complete Left    Return if symptoms worsen or fail to improve.    Libby Maw, MD

## 2021-06-18 NOTE — Telephone Encounter (Signed)
Pt calling regarding a glucose monitor that does not pinch when taking the glucose reading (Dexcom type device for arm mount).

## 2021-06-21 ENCOUNTER — Ambulatory Visit (INDEPENDENT_AMBULATORY_CARE_PROVIDER_SITE_OTHER): Payer: BC Managed Care – PPO | Admitting: Internal Medicine

## 2021-06-21 ENCOUNTER — Encounter: Payer: Self-pay | Admitting: Internal Medicine

## 2021-06-21 VITALS — BP 110/60 | HR 76 | Temp 98.3°F | Ht 65.0 in | Wt 120.3 lb

## 2021-06-21 DIAGNOSIS — R7302 Impaired glucose tolerance (oral): Secondary | ICD-10-CM | POA: Diagnosis not present

## 2021-06-21 DIAGNOSIS — M3500 Sicca syndrome, unspecified: Secondary | ICD-10-CM | POA: Diagnosis not present

## 2021-06-21 DIAGNOSIS — R5383 Other fatigue: Secondary | ICD-10-CM | POA: Diagnosis not present

## 2021-06-21 LAB — POCT GLYCOSYLATED HEMOGLOBIN (HGB A1C): Hemoglobin A1C: 5.4 % (ref 4.0–5.6)

## 2021-06-21 MED ORDER — FREESTYLE LIBRE 3 SENSOR MISC: 1.0000 | Freq: Every day | 3 refills | Status: DC

## 2021-06-21 NOTE — Progress Notes (Signed)
Established Patient Office Visit     CC/Reason for Visit: Discuss fatigue and other symptoms  HPI: Regina Mendez is a 61 y.o. female who is coming in today for the above mentioned reasons.  She has been dealing with extremely dry eyes and mouth now for some time.  She is being followed by a rheumatologist in [facility].  They do not believe she has Sjogren's syndrome.  They have recommended an ENT referral for salivary gland biopsy for formal diagnosis.  This has not been done yet.  She visits the rheumatologist again tomorrow.  She has also been complaining of excessive and extreme fatigue and joint aches.  Past Medical/Surgical History: Past Medical History:  Diagnosis Date   Acne    At high risk for breast cancer    33% lifetime risk, negative genetic testing [facility].   Breast cyst    COVID 05/2020   Migraine headache     Past Surgical History:  Procedure Laterality Date   APPENDECTOMY     BREAST SURGERY     right breast bx   BREAST SURGERY     Right lumpectomy -dysplastic cells-2018   COLPOSCOPY     GYNECOLOGIC CRYOSURGERY  age 25   OVARIAN CYST SURGERY     TONSILLECTOMY      Social History:  reports that she has never smoked. She has never used smokeless tobacco. She reports current alcohol use of about 1.0 standard drink per week. She reports that she does not use drugs.  Allergies: Allergies  Allergen Reactions   Codeine Other (See Comments)    Drowsy   Sulfonamide Derivatives Dermatitis    Family History:  Family History  Problem Relation Age of Onset   Thyroid cancer Mother    Hypertension Father    Heart disease Father    Breast cancer Cousin        Mat. 1st cousins X 2     Age 52's     Current Outpatient Medications:    Ascorbic Acid (VITAMIN C) 100 MG tablet, Take 100 mg by mouth daily., Disp: , Rfl:    BIOTIN PO, Take by mouth., Disp: , Rfl:    clindamycin (CLEOCIN-T) 1 % external solution, Apply topically 2 (two) times daily., Disp: 30  mL, Rfl: 5   Continuous Blood Gluc Sensor (FREESTYLE LIBRE 3 SENSOR) MISC, 1 each by Does not apply route daily. Place 1 sensor on the skin every 14 days. Use to check glucose continuously, Disp: 2 each, Rfl: 3   Flaxseed, Linseed, (FLAX SEED OIL PO), Take by mouth., Disp: , Rfl:    ipratropium (ATROVENT) 0.03 % nasal spray, Place 2 sprays into both nostrils every 12 (twelve) hours., Disp: 30 mL, Rfl: 3   meloxicam (MOBIC) 7.5 MG tablet, Take 1 tablet (7.5 mg total) by mouth daily., Disp: 30 tablet, Rfl: 6   Multiple Vitamin (MULTIVITAMIN ADULT PO), Take by mouth., Disp: , Rfl:    Multiple Vitamins-Minerals (HAIR/SKIN/NAILS PO), Take by mouth., Disp: , Rfl:    OVER THE COUNTER MEDICATION, Krim Oil Ginger pill Iodine in kelp Marshmallow root, Disp: , Rfl:    PROAIR HFA 108 (90 Base) MCG/ACT inhaler, USE 2 PUFFS EVERY 6 HOURS AS NEEDED., Disp: 8.5 g, Rfl: 2   Probiotic Product (PROBIOTIC-10 PO), Take by mouth., Disp: , Rfl:    QVAR REDIHALER 40 MCG/ACT inhaler, INHALE 2 PUFFS INTO THE LUNGS TWICE DAILY, Disp: 8.7 g, Rfl: 2   tretinoin (RETIN-A) 0.05 % cream, Apply  topically at bedtime., Disp: 45 g, Rfl: 5   triamcinolone cream (KENALOG) 0.1 %, Apply 1 application topically 2 (two) times daily., Disp: 30 g, Rfl: 1   VITAMIN D PO, Take by mouth., Disp: , Rfl:    atorvastatin (LIPITOR) 20 MG tablet, Take 1 tablet (20 mg total) by mouth daily. (Patient not taking: Reported on 06/21/2021), Disp: 90 tablet, Rfl: 1  Review of Systems:  Constitutional: Denies fever, chills, diaphoresis, appetite change. HEENT: Denies photophobia, eye pain, redness, hearing loss, ear pain, congestion, sore throat, rhinorrhea, sneezing, mouth sores, trouble swallowing, neck pain, neck stiffness and tinnitus.   Respiratory: Denies SOB, DOE, cough, chest tightness,  and wheezing.   Cardiovascular: Denies chest pain, palpitations and leg swelling.  Gastrointestinal: Denies nausea, vomiting, abdominal pain, diarrhea,  constipation, blood in stool and abdominal distention.  Genitourinary: Denies dysuria, urgency, frequency, hematuria, flank pain and difficulty urinating.  Endocrine: Denies: hot or cold intolerance, sweats, changes in hair or nails, polyuria, polydipsia. Musculoskeletal: Denies myalgias, back pain, joint swelling, arthralgias and gait problem.  Skin: Denies pallor, rash and wound.  Neurological: Denies dizziness, seizures, syncope, weakness, light-headedness, numbness and headaches.  Hematological: Denies adenopathy. Easy bruising, personal or family bleeding history  Psychiatric/Behavioral: Denies suicidal ideation, mood changes, confusion, nervousness, sleep disturbance and agitation    Physical Exam: Vitals:   06/21/21 1419  BP: 110/60  Pulse: 76  Temp: 98.3 F (36.8 C)  TempSrc: Oral  SpO2: 95%  Weight: 120 lb 4.8 oz (54.6 kg)  Height: '5\' 5"'$  (1.651 m)    Body mass index is 20.02 kg/m.   Constitutional: NAD, calm, comfortable Eyes: PERRL, lids and conjunctivae normal, wears corrective lenses ENMT: Mucous membranes are moist.  Respiratory: clear to auscultation bilaterally, no wheezing, no crackles. Normal respiratory effort. No accessory muscle use.  Cardiovascular: Regular rate and rhythm, no murmurs / rubs / gallops. No extremity edema. Psychiatric: Normal judgment and insight. Alert and oriented x 3. Normal mood.    Impression and Plan:  IGT (impaired glucose tolerance)  - Plan: POC HgB A1c -In office A1c of 5.1 demonstrates excellent control, doubt reason for fatigue.  Fatigue, unspecified type  - Plan: CBC with Differential/Platelet, Comprehensive metabolic panel, Lipid panel, TSH, Vitamin B12, VITAMIN D 25 Hydroxy (Vit-D Deficiency, Fractures)  Sicca syndrome (HCC)  - Plan: Ambulatory referral to ENT per rheumatology recommendations.    Time spent:30 minutes reviewing chart, interviewing and examining patient and formulating plan of care.     Lelon Frohlich, MD [facility]

## 2021-06-23 ENCOUNTER — Other Ambulatory Visit (INDEPENDENT_AMBULATORY_CARE_PROVIDER_SITE_OTHER): Payer: BC Managed Care – PPO

## 2021-06-23 ENCOUNTER — Other Ambulatory Visit: Payer: Self-pay

## 2021-06-23 DIAGNOSIS — E7800 Pure hypercholesterolemia, unspecified: Secondary | ICD-10-CM

## 2021-06-23 DIAGNOSIS — E785 Hyperlipidemia, unspecified: Secondary | ICD-10-CM

## 2021-06-23 DIAGNOSIS — R5383 Other fatigue: Secondary | ICD-10-CM | POA: Diagnosis not present

## 2021-06-23 LAB — COMPREHENSIVE METABOLIC PANEL
ALT: 12 U/L (ref 0–35)
AST: 18 U/L (ref 0–37)
Albumin: 4.1 g/dL (ref 3.5–5.2)
Alkaline Phosphatase: 50 U/L (ref 39–117)
BUN: 12 mg/dL (ref 6–23)
CO2: 31 mEq/L (ref 19–32)
Calcium: 9.5 mg/dL (ref 8.4–10.5)
Chloride: 103 mEq/L (ref 96–112)
Creatinine, Ser: 0.77 mg/dL (ref 0.40–1.20)
GFR: 83.98 mL/min (ref >60.00)
Glucose, Bld: 97 mg/dL (ref 70–99)
Potassium: 4.3 mEq/L (ref 3.5–5.1)
Sodium: 140 mEq/L (ref 135–145)
Total Bilirubin: 0.5 mg/dL (ref 0.2–1.2)
Total Protein: 7 g/dL (ref 6.0–8.3)

## 2021-06-23 LAB — CBC WITH DIFFERENTIAL/PLATELET
Basophils Absolute: 0 10*3/uL (ref 0.0–0.1)
Basophils Relative: 0.7 % (ref 0.0–3.0)
Eosinophils Absolute: 0.2 10*3/uL (ref 0.0–0.7)
Eosinophils Relative: 3.7 % (ref 0.0–5.0)
HCT: 40.3 % (ref 36.0–46.0)
Hemoglobin: 13.3 g/dL (ref 12.0–15.0)
Lymphocytes Relative: 35.9 % (ref 12.0–46.0)
Lymphs Abs: 2.2 10*3/uL (ref 0.7–4.0)
MCHC: 33 g/dL (ref 30.0–36.0)
MCV: 95.7 fl (ref 78.0–100.0)
Monocytes Absolute: 0.7 10*3/uL (ref 0.1–1.0)
Monocytes Relative: 12.1 % — ABNORMAL HIGH (ref 3.0–12.0)
Neutro Abs: 2.9 10*3/uL (ref 1.4–7.7)
Neutrophils Relative %: 47.6 % (ref 43.0–77.0)
Platelets: 236 10*3/uL (ref 150.0–400.0)
RBC: 4.21 Mil/uL (ref 3.87–5.11)
RDW: 13.2 % (ref 11.5–15.5)
WBC: 6.1 10*3/uL (ref 4.0–10.5)

## 2021-06-23 LAB — LIPID PANEL
Cholesterol: 220 mg/dL — ABNORMAL HIGH (ref 0–200)
HDL: 52.4 mg/dL (ref >39.00)
LDL Cholesterol: 150 mg/dL — ABNORMAL HIGH (ref 0–99)
NonHDL: 167.38
Total CHOL/HDL Ratio: 4
Triglycerides: 88 mg/dL (ref 0.0–149.0)
VLDL: 17.6 mg/dL (ref 0.0–40.0)

## 2021-06-23 LAB — VITAMIN D 25 HYDROXY (VIT D DEFICIENCY, FRACTURES): VITD: 86.43 ng/mL (ref 30.00–100.00)

## 2021-06-23 LAB — TSH: TSH: 1.71 u[IU]/mL (ref 0.35–5.50)

## 2021-06-23 LAB — VITAMIN B12: Vitamin B-12: 833 pg/mL (ref 211–911)

## 2021-06-23 MED ORDER — ATORVASTATIN CALCIUM 20 MG PO TABS: 20.0000 mg | ORAL_TABLET | Freq: Every day | ORAL | 1 refills | Status: DC

## 2021-06-24 ENCOUNTER — Encounter: Payer: Self-pay | Admitting: Internal Medicine

## 2021-07-08 ENCOUNTER — Encounter: Payer: Self-pay | Admitting: Internal Medicine

## 2021-08-17 ENCOUNTER — Encounter: Payer: Self-pay | Admitting: Obstetrics and Gynecology

## 2021-08-17 ENCOUNTER — Ambulatory Visit (INDEPENDENT_AMBULATORY_CARE_PROVIDER_SITE_OTHER): Payer: BC Managed Care – PPO | Admitting: Obstetrics and Gynecology

## 2021-08-17 VITALS — BP 110/72 | HR 68 | Ht 65.5 in | Wt 119.0 lb

## 2021-08-17 DIAGNOSIS — Z9189 Other specified personal risk factors, not elsewhere classified: Secondary | ICD-10-CM

## 2021-08-17 DIAGNOSIS — Z124 Encounter for screening for malignant neoplasm of cervix: Secondary | ICD-10-CM

## 2021-08-17 DIAGNOSIS — Z01419 Encounter for gynecological examination (general) (routine) without abnormal findings: Secondary | ICD-10-CM

## 2021-08-17 DIAGNOSIS — Z87410 Personal history of cervical dysplasia: Secondary | ICD-10-CM

## 2021-08-17 DIAGNOSIS — N889 Noninflammatory disorder of cervix uteri, unspecified: Secondary | ICD-10-CM

## 2021-08-17 NOTE — Progress Notes (Unsigned)
61 y.o. G48P2002 Married [place] female here for annual exam.    Wants to see a dermatologist for her facial hair and facial pigmentation.  She will self refer,. She is asking for a recommendation for an office or provider.  Difficulty passing stool. No splinting.   Using red yeast rice for her cholesterol.  Wants to do a breast MRI.   PCP:  Lelon Frohlich, MD   Patient's last menstrual period was 08/13/2012.           Sexually active: No.  Husband low testosterone The current method of family planning is post menopausal status.    Exercising: Yes.     Yoga, dance, swimming Smoker:  no  Health Maintenance: Pap: 05-09-17 Neg:Neg HR HPV, 02-05-15 Neg, 10-06-11 Neg History of abnormal Pap:  yes, Hx of cryotherapy.  Follow up paps normal.  Colposcopic biopsy report in Epic for 08/17/00  showing LGSIL. MMG:  11-12-20 Neg/Birads1 Colonoscopy:  05-04-17 BMD:   2010  Result :Normal TDaP:  06-18-21 Gardasil:   n/a HIV: Neg in past Hep C: 12-04-20 Screening Labs: PCP   reports that she has never smoked. She has never used smokeless tobacco. She reports current alcohol use of about 1.0 standard drink of alcohol per week. She reports that she does not use drugs.  Past Medical History:  Diagnosis Date   Acne    At high risk for breast cancer    33% lifetime risk, negative genetic testing [facility].   Breast cyst    COVID 05/2020   Dry mouth and eyes    Migraine headache     Past Surgical History:  Procedure Laterality Date   APPENDECTOMY     BREAST SURGERY     right breast bx   BREAST SURGERY     Right lumpectomy -dysplastic cells-2018   COLPOSCOPY     GYNECOLOGIC CRYOSURGERY  age 65   OVARIAN CYST SURGERY     TONSILLECTOMY      Current Outpatient Medications  Medication Sig Dispense Refill   Ascorbic Acid (VITAMIN C) 100 MG tablet Take 100 mg by mouth daily.     atorvastatin (LIPITOR) 20 MG tablet Take 1 tablet (20 mg total) by mouth daily. 90 tablet 1   cevimeline  (EVOXAC) 30 MG capsule Take by mouth.     clindamycin (CLEOCIN-T) 1 % external solution Apply topically 2 (two) times daily. 30 mL 5   Cyanocobalamin (VITAMIN B 12) 500 MCG TABS 1 tablet Orally Once a day for 30 day(s)     Flaxseed, Linseed, (FLAX SEED OIL PO) Take by mouth.     ibuprofen (ADVIL) 200 MG tablet 1 tablet with food or milk as needed Orally Three times a day     ipratropium (ATROVENT) 0.03 % nasal spray Place 2 sprays into both nostrils every 12 (twelve) hours. 30 mL 3   meloxicam (MOBIC) 7.5 MG tablet Take 1 tablet (7.5 mg total) by mouth daily. 30 tablet 6   Multiple Vitamins-Minerals (HAIR/SKIN/NAILS PO) Take by mouth.     Omega-3 Fatty Acids (FISH OIL) 1000 MG CAPS 1 capsule Orally Once a day     OVER THE COUNTER MEDICATION Krim Oil Ginger pill Iodine in kelp Marshmallow root     PROAIR HFA 108 (90 Base) MCG/ACT inhaler USE 2 PUFFS EVERY 6 HOURS AS NEEDED. 8.5 g 2   Probiotic Product (PROBIOTIC-10 PO) Take by mouth.     QVAR REDIHALER 40 MCG/ACT inhaler INHALE 2 PUFFS INTO THE LUNGS TWICE  DAILY 8.7 g 2   tretinoin (RETIN-A) 0.05 % cream Apply topically at bedtime. 45 g 5   triamcinolone cream (KENALOG) 0.1 % Apply 1 application topically 2 (two) times daily. 30 g 1   VITAMIN D PO Take by mouth.     No current facility-administered medications for this visit.    Family History  Problem Relation Age of Onset   Thyroid cancer Mother    Hypertension Father    Heart disease Father    Breast cancer Cousin        Mat. 1st cousins X 2     Age 46's    Review of Systems  All other systems reviewed and are negative.   Exam:   BP 110/72   Pulse 68   Ht 5' 5.5" (1.664 m)   Wt 119 lb (54 kg)   LMP 08/13/2012 Comment: gyn  SpO2 96%   BMI 19.50 kg/m     General appearance: alert, cooperative and appears stated age Head: normocephalic, without obvious abnormality, atraumatic Neck: no adenopathy, supple, symmetrical, trachea midline and thyroid normal to inspection and  palpation Lungs: clear to auscultation bilaterally Breasts: normal appearance, no masses or tenderness, No nipple retraction or dimpling, No nipple discharge or bleeding, No axillary adenopathy Heart: regular rate and rhythm Abdomen: soft, non-tender; no masses, no organomegaly Extremities: extremities normal, atraumatic, no cyanosis or edema Skin: skin color, texture, turgor normal. No rashes or lesions Lymph nodes: cervical, supraclavicular, and axillary nodes normal. Neurologic: grossly normal  Pelvic: External genitalia:  no lesions              No abnormal inguinal nodes palpated.              Urethra:  normal appearing urethra with no masses, tenderness or lesions              Bartholins and Skenes: normal                 Vagina: normal appearing vagina with normal color and discharge, no lesions              Cervix:   3 - 4 mm round area of erythema of the cervix at 4:00.               Pap taken: yes Bimanual Exam:  Uterus:  normal size, contour, position, consistency, mobility, non-tender              Adnexa: no mass, fullness, tenderness              Rectal exam: yes.  Confirms.              Anus:  normal sphincter tone, no lesions  Chaperone was present for exam:  Estill Bamberg, CMA  Assessment:   Well woman visit with gynecologic exam. 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 Status post excisional biopsy 02/22/16 showing [place] and focal atypia. Increased lifetime risk of breast cancer, 33% by Tyrer-Cuzick model. Negative genetic testing.  (See [place] noted from 04/10/18.) Small cervical lesion.  May be atrophy. Remote history of cervical cryotherapy.  Hx LGSIL of cervix.   Plan: Mammogram screening discussed. Will assist in scheduling breast MRI.  Self breast awareness reviewed. Pap and HR HPV collected. Guidelines for Calcium, Vitamin D, regular exercise program including cardiovascular and weight bearing exercise. I suggested [facility] for her  skin concerns.    Follow up annually and prn.   After visit summary provided.

## 2021-08-18 ENCOUNTER — Telehealth: Payer: Self-pay | Admitting: Obstetrics and Gynecology

## 2021-08-18 ENCOUNTER — Other Ambulatory Visit: Payer: Self-pay

## 2021-08-18 ENCOUNTER — Other Ambulatory Visit (HOSPITAL_COMMUNITY)
Admission: RE | Admit: 2021-08-18 | Discharge: 2021-08-18 | Disposition: A | Discharge status: Home / Self Care | Payer: BC Managed Care – PPO | Source: Ambulatory Visit | Attending: Obstetrics and Gynecology | Admitting: Obstetrics and Gynecology

## 2021-08-18 DIAGNOSIS — Z124 Encounter for screening for malignant neoplasm of cervix: Secondary | ICD-10-CM | POA: Insufficient documentation

## 2021-08-18 DIAGNOSIS — Z9189 Other specified personal risk factors, not elsewhere classified: Secondary | ICD-10-CM

## 2021-08-18 DIAGNOSIS — Z87410 Personal history of cervical dysplasia: Secondary | ICD-10-CM | POA: Diagnosis present

## 2021-08-18 DIAGNOSIS — N889 Noninflammatory disorder of cervix uteri, unspecified: Secondary | ICD-10-CM | POA: Insufficient documentation

## 2021-08-18 NOTE — Telephone Encounter (Signed)
Order placed. Left message to call.

## 2021-08-18 NOTE — Telephone Encounter (Signed)
Please assist in scheduling breast MRI at [place].   Patient is at increased risk for breast cancer, 33% lifetime risk.

## 2021-08-18 NOTE — Patient Instructions (Signed)

## 2021-08-19 LAB — CYTOLOGY - PAP
Comment: NEGATIVE
Diagnosis: NEGATIVE
High risk HPV: NEGATIVE

## 2021-08-26 NOTE — Telephone Encounter (Signed)
Patient given # to call [facility] to schedule.

## 2021-09-05 NOTE — Telephone Encounter (Signed)
Encounter reviewed and closed.  

## 2021-09-28 ENCOUNTER — Encounter: Payer: Self-pay | Admitting: Internal Medicine

## 2021-09-28 ENCOUNTER — Ambulatory Visit (INDEPENDENT_AMBULATORY_CARE_PROVIDER_SITE_OTHER): Payer: BC Managed Care – PPO | Admitting: Internal Medicine

## 2021-09-28 VITALS — BP 102/64 | HR 64 | Temp 98.4°F | Ht 66.0 in | Wt 117.4 lb

## 2021-09-28 DIAGNOSIS — R7302 Impaired glucose tolerance (oral): Secondary | ICD-10-CM

## 2021-09-28 DIAGNOSIS — Z0000 Encounter for general adult medical examination without abnormal findings: Secondary | ICD-10-CM | POA: Diagnosis not present

## 2021-09-28 DIAGNOSIS — E785 Hyperlipidemia, unspecified: Secondary | ICD-10-CM | POA: Diagnosis not present

## 2021-09-28 DIAGNOSIS — Z23 Encounter for immunization: Secondary | ICD-10-CM | POA: Diagnosis not present

## 2021-09-28 DIAGNOSIS — R7989 Other specified abnormal findings of blood chemistry: Secondary | ICD-10-CM

## 2021-09-28 LAB — LIPID PANEL
Cholesterol: 206 mg/dL — ABNORMAL HIGH (ref 0–200)
HDL: 54.6 mg/dL (ref >39.00)
LDL Cholesterol: 129 mg/dL — ABNORMAL HIGH (ref 0–99)
NonHDL: 151.3
Total CHOL/HDL Ratio: 4
Triglycerides: 110 mg/dL (ref 0.0–149.0)
VLDL: 22 mg/dL (ref 0.0–40.0)

## 2021-09-28 LAB — HEMOGLOBIN A1C: Hgb A1c MFr Bld: 5.9 % (ref 4.6–6.5)

## 2021-09-28 LAB — TSH: TSH: 1.16 u[IU]/mL (ref 0.35–5.50)

## 2021-09-28 NOTE — Progress Notes (Signed)
Established Patient Office Visit     CC/Reason for Visit: Annual preventive exam  HPI: Regina Mendez is a 61 y.o. female who is coming in today for the above mentioned reasons. 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  She decided not to take prescribed atorvastatin and has been focusing on lifestyle changes and red rice yeast.  She has routine eye and dental care.  She is due for flu and bivalent COVID vaccines.  Cancer screenings are up-to-date.   Past Medical/Surgical History: Past Medical History:  Diagnosis Date   Acne    At high risk for breast cancer    33% lifetime risk, negative genetic testing [facility].   Breast cyst    COVID 05/2020   Dry mouth and eyes    Migraine headache     Past Surgical History:  Procedure Laterality Date   APPENDECTOMY     BREAST SURGERY     right breast bx   BREAST SURGERY     Right lumpectomy -dysplastic cells-2018   COLPOSCOPY     GYNECOLOGIC CRYOSURGERY  age 25   OVARIAN CYST SURGERY     TONSILLECTOMY      Social History:  reports that she has never smoked. She has never used smokeless tobacco. She reports current alcohol use of about 1.0 standard drink of alcohol per week. She reports that she does not use drugs.  Allergies: Allergies  Allergen Reactions   Codeine Other (See Comments)    Drowsy   Sulfonamide Derivatives Dermatitis    Family History:  Family History  Problem Relation Age of Onset   Thyroid cancer Mother    Hypertension Father    Heart disease Father    Breast cancer Cousin        Mat. 1st cousins X 2     Age 23's     Current Outpatient Medications:    Ascorbic Acid (VITAMIN C) 100 MG tablet, Take 100 mg by mouth daily., Disp: , Rfl:    clindamycin (CLEOCIN-T) 1 % external solution, Apply topically 2 (two) times daily., Disp:  30 mL, Rfl: 5   Cyanocobalamin (VITAMIN B 12) 500 MCG TABS, 1 tablet Orally Once a day for 30 day(s), Disp: , Rfl:    Flaxseed, Linseed, (FLAX SEED OIL PO), Take by mouth., Disp: , Rfl:    ibuprofen (ADVIL) 200 MG tablet, 1 tablet with food or milk as needed Orally Three times a day, Disp: , Rfl:    ipratropium (ATROVENT) 0.03 % nasal spray, Place 2 sprays into both nostrils every 12 (twelve) hours., Disp: 30 mL, Rfl: 3   meloxicam (MOBIC) 7.5 MG tablet, Take 1 tablet (7.5 mg total) by mouth daily., Disp: 30 tablet, Rfl: 6   Multiple Vitamins-Minerals (HAIR/SKIN/NAILS PO), Take by mouth., Disp: , Rfl:    Omega-3 Fatty Acids (FISH OIL) 1000 MG CAPS, 1 capsule Orally Once a day, Disp: , Rfl:    OVER THE COUNTER MEDICATION, Krim Oil Ginger pill Iodine in kelp Marshmallow root, Disp: , Rfl:    PROAIR HFA 108 (90 Base) MCG/ACT inhaler, USE 2 PUFFS EVERY 6 HOURS AS NEEDED., Disp: 8.5 g, Rfl: 2   Probiotic Product (PROBIOTIC-10 PO), Take by mouth., Disp: , Rfl:    QVAR REDIHALER 40 MCG/ACT inhaler, INHALE 2 PUFFS INTO THE LUNGS TWICE DAILY, Disp: 8.7 g, Rfl:  2   tretinoin (RETIN-A) 0.05 % cream, Apply topically at bedtime., Disp: 45 g, Rfl: 5   triamcinolone cream (KENALOG) 0.1 %, Apply 1 application topically 2 (two) times daily., Disp: 30 g, Rfl: 1   VITAMIN D PO, Take by mouth., Disp: , Rfl:   Review of Systems:  Constitutional: Denies fever, chills, diaphoresis, appetite change and fatigue.  HEENT: Denies photophobia, eye pain, redness, hearing loss, ear pain, congestion, sore throat, rhinorrhea, sneezing, mouth sores, trouble swallowing, neck pain, neck stiffness and tinnitus.   Respiratory: Denies SOB, DOE, cough, chest tightness,  and wheezing.   Cardiovascular: Denies chest pain, palpitations and leg swelling.  Gastrointestinal: Denies nausea, vomiting, abdominal pain, diarrhea, constipation, blood in stool and abdominal distention.  Genitourinary: Denies dysuria, urgency, frequency,  hematuria, flank pain and difficulty urinating.  Endocrine: Denies: hot or cold intolerance, sweats, changes in hair or nails, polyuria, polydipsia. Musculoskeletal: Denies myalgias, back pain, joint swelling, arthralgias and gait problem.  Skin: Denies pallor, rash and wound.  Neurological: Denies dizziness, seizures, syncope, weakness, light-headedness, numbness and headaches.  Hematological: Denies adenopathy. Easy bruising, personal or family bleeding history  Psychiatric/Behavioral: Denies suicidal ideation, mood changes, confusion, nervousness, sleep disturbance and agitation    Physical Exam: Vitals:   09/28/21 0859  BP: 102/64  Pulse: 64  Temp: 98.4 F (36.9 C)  TempSrc: Oral  SpO2: 98%  Weight: 117 lb 6.4 oz (53.3 kg)  Height: '5\' 6"'$  (1.676 m)    Body mass index is 18.95 kg/m.   Constitutional: NAD, calm, comfortable Eyes: PERRL, lids and conjunctivae normal ENMT: Mucous membranes are moist. Posterior pharynx clear of any exudate or lesions. Normal dentition. Tympanic membrane is pearly white, no erythema or bulging. Neck: normal, supple, no masses, no thyromegaly Respiratory: clear to auscultation bilaterally, no wheezing, no crackles. Normal respiratory effort. No accessory muscle use.  Cardiovascular: Regular rate and rhythm, no murmurs / rubs / gallops. No extremity edema. 2+ pedal pulses. No carotid bruits.  Abdomen: no tenderness, no masses palpated. No hepatosplenomegaly. Bowel sounds positive.  Musculoskeletal: no clubbing / cyanosis. No joint deformity upper and lower extremities. Good ROM, no contractures. Normal muscle tone.  Skin: no rashes, lesions, ulcers. No induration Neurologic: CN 2-12 grossly intact. Sensation intact, DTR normal. Strength 5/5 in all 4.  Psychiatric: Normal judgment and insight. Alert and oriented x 3. Normal mood.   [facility] Visit from 09/28/2021 in [place] at [place]  PHQ-9 Total Score 0         Impression and Plan:  Encounter for preventive health examination -Recommend routine eye and dental care. -Immunizations: Flu vaccine in office today, advised COVID at pharmacy -Healthy lifestyle discussed in detail. -Labs to be updated today. -Colon cancer screening: 05/2017 -Breast cancer screening: 10/2020 -Cervical cancer screening: 07/2021 -Lung cancer screening: Not applicable -Prostate cancer screening: Not applicable -DEXA: Not applicable  Needs flu shot  - Plan: Flu Vaccine QUAD 6+ mos PF IM (Fluarix Quad PF)  IGT (impaired glucose tolerance)  - Plan: Hemoglobin A1c  Hyperlipidemia, unspecified hyperlipidemia type  - Plan: Lipid panel  Abnormal TSH  - Plan: TSH    Taylr Meuth Isaac Bliss, MD [facility]

## 2021-09-29 ENCOUNTER — Telehealth: Payer: Self-pay | Admitting: Internal Medicine

## 2021-09-29 NOTE — Telephone Encounter (Signed)
Pt would like her blood work results

## 2021-09-30 ENCOUNTER — Telehealth: Payer: Self-pay | Admitting: Internal Medicine

## 2021-09-30 ENCOUNTER — Other Ambulatory Visit: Payer: Self-pay | Admitting: *Deleted

## 2021-09-30 DIAGNOSIS — E785 Hyperlipidemia, unspecified: Secondary | ICD-10-CM

## 2021-09-30 NOTE — Telephone Encounter (Signed)
Reviewed lab results with patient.

## 2021-09-30 NOTE — Telephone Encounter (Signed)
Pt called, returning CMA's call. CMA was unavailable. Pt asked that CMA call back at their earliest convenience. 

## 2021-10-08 ENCOUNTER — Other Ambulatory Visit: Payer: Self-pay | Admitting: Internal Medicine

## 2021-10-08 DIAGNOSIS — Z1231 Encounter for screening mammogram for malignant neoplasm of breast: Secondary | ICD-10-CM

## 2021-11-08 ENCOUNTER — Other Ambulatory Visit: Payer: BC Managed Care – PPO

## 2021-11-09 ENCOUNTER — Other Ambulatory Visit: Payer: BC Managed Care – PPO

## 2021-11-16 ENCOUNTER — Ambulatory Visit: Payer: BC Managed Care – PPO

## 2021-12-01 ENCOUNTER — Ambulatory Visit
Admission: RE | Admit: 2021-12-01 | Discharge: 2021-12-01 | Disposition: A | Discharge status: Home / Self Care | Payer: BC Managed Care – PPO | Source: Ambulatory Visit | Attending: Internal Medicine | Admitting: Internal Medicine

## 2021-12-01 DIAGNOSIS — Z1231 Encounter for screening mammogram for malignant neoplasm of breast: Secondary | ICD-10-CM

## 2021-12-06 ENCOUNTER — Other Ambulatory Visit: Payer: Self-pay | Admitting: Internal Medicine

## 2021-12-06 DIAGNOSIS — R928 Other abnormal and inconclusive findings on diagnostic imaging of breast: Secondary | ICD-10-CM

## 2021-12-13 ENCOUNTER — Telehealth: Payer: Self-pay | Admitting: Internal Medicine

## 2021-12-13 NOTE — Telephone Encounter (Signed)
error 

## 2021-12-17 ENCOUNTER — Ambulatory Visit
Admission: RE | Admit: 2021-12-17 | Discharge: 2021-12-17 | Disposition: A | Discharge status: Home / Self Care | Payer: BC Managed Care – PPO | Source: Ambulatory Visit | Attending: Internal Medicine | Admitting: Internal Medicine

## 2021-12-17 DIAGNOSIS — R928 Other abnormal and inconclusive findings on diagnostic imaging of breast: Secondary | ICD-10-CM

## 2021-12-20 ENCOUNTER — Encounter: Payer: Self-pay | Admitting: Obstetrics and Gynecology

## 2022-01-11 ENCOUNTER — Telehealth: Payer: Self-pay | Admitting: Internal Medicine

## 2022-01-11 NOTE — Telephone Encounter (Signed)
Patient is aware 

## 2022-01-11 NOTE — Telephone Encounter (Signed)
Pt questioning if she needs to take the shingle shot she has scheduled for friday

## 2022-01-11 NOTE — Telephone Encounter (Signed)
Left message on machine for patient to return our call 

## 2022-01-14 ENCOUNTER — Ambulatory Visit: Payer: BC Managed Care – PPO

## 2022-02-21 ENCOUNTER — Ambulatory Visit (INDEPENDENT_AMBULATORY_CARE_PROVIDER_SITE_OTHER): Payer: BC Managed Care – PPO

## 2022-02-21 ENCOUNTER — Ambulatory Visit: Payer: BC Managed Care – PPO | Admitting: Sports Medicine

## 2022-02-21 VITALS — Ht 66.0 in | Wt 117.0 lb

## 2022-02-21 DIAGNOSIS — M79671 Pain in right foot: Secondary | ICD-10-CM | POA: Diagnosis not present

## 2022-02-21 MED ORDER — MELOXICAM 7.5 MG PO TABS: 7.5000 mg | ORAL_TABLET | Freq: Every day | ORAL | 0 refills | Status: DC

## 2022-02-21 NOTE — Patient Instructions (Addendum)
Good to see you  - Start meloxicam 7.5 mg daily x2 weeks.  If still having pain after 2 weeks, complete 3rd-week of meloxicam. May use remaining meloxicam as needed once daily for pain control.  Do not to use additional NSAIDs while taking meloxicam.  May use Tylenol [id number] mg 2 to 3 times a day for breakthrough pain. Recommend avoiding  activity for 2 weeks and the gradually come out of boot 3rd week As needed follow up if no improvement 4-6 weeks

## 2022-02-21 NOTE — Progress Notes (Signed)
Regina Mendez D.[place] Phone: [phone]   Assessment and Plan:     1. Right foot pain -Acute, uncomplicated, initial sports medicine visit - Contusion of right third digit occurring yesterday without fracture seen on x-ray at today's visit - X-ray obtained in clinic.  Monitor rotation: No acute fracture or dislocation. - Recommend using postop shoe as patient is currently having pain with ambulation.  May gradually discontinue postop shoe as tolerated with goal of pain-free weightbearing - Start meloxicam 7.5 mg daily x2 weeks.  If still having pain after 2 weeks, complete 3rd-week of meloxicam. May use remaining meloxicam as needed once daily for pain control.  Do not to use additional NSAIDs while taking meloxicam.  May use Tylenol [phone] mg 2 to 3 times a day for breakthrough pain. - Recommend avoiding physical activity for the next 2 weeks and then may gradually reintroduce as tolerated - DG Foot Complete Right; Future    Pertinent previous records reviewed include none   Follow Up: As needed if no improvement or worsening of symptoms in 4 to 6 weeks.  Could repeat x-ray and consider ultrasound at that time   Subjective:   I, Regina Mendez, am serving as a Education administrator for Doctor Glennon Mac  Chief Complaint: right toe pain   HPI:   02/22/2022 Patient is a 62 year old female complaining of right toe pain . Patient states that she moved from the chair she hit something and then she had pain , pain with flexion, swollen and bruised, yesterday is when it happened, no numbness or tingling, ib for the pain and that maybe helped   Relevant Historical Information: None pertinent  Additional pertinent review of systems negative.   Current Outpatient Medications:    Ascorbic Acid (VITAMIN C) 100 MG tablet, Take 100 mg by mouth daily., Disp: , Rfl:    clindamycin (CLEOCIN-T) 1 % external solution,  Apply topically 2 (two) times daily., Disp: 30 mL, Rfl: 5   Cyanocobalamin (VITAMIN B 12) 500 MCG TABS, 1 tablet Orally Once a day for 30 day(s), Disp: , Rfl:    Flaxseed, Linseed, (FLAX SEED OIL PO), Take by mouth., Disp: , Rfl:    ibuprofen (ADVIL) 200 MG tablet, 1 tablet with food or milk as needed Orally Three times a day, Disp: , Rfl:    ipratropium (ATROVENT) 0.03 % nasal spray, Place 2 sprays into both nostrils every 12 (twelve) hours., Disp: 30 mL, Rfl: 3   meloxicam (MOBIC) 7.5 MG tablet, Take 1 tablet (7.5 mg total) by mouth daily., Disp: 30 tablet, Rfl: 0   Multiple Vitamins-Minerals (HAIR/SKIN/NAILS PO), Take by mouth., Disp: , Rfl:    Omega-3 Fatty Acids (FISH OIL) 1000 MG CAPS, 1 capsule Orally Once a day, Disp: , Rfl:    OVER THE COUNTER MEDICATION, Krim Oil Ginger pill Iodine in kelp Marshmallow root, Disp: , Rfl:    PROAIR HFA 108 (90 Base) MCG/ACT inhaler, USE 2 PUFFS EVERY 6 HOURS AS NEEDED., Disp: 8.5 g, Rfl: 2   Probiotic Product (PROBIOTIC-10 PO), Take by mouth., Disp: , Rfl:    QVAR REDIHALER 40 MCG/ACT inhaler, INHALE 2 PUFFS INTO THE LUNGS TWICE DAILY, Disp: 8.7 g, Rfl: 2   tretinoin (RETIN-A) 0.05 % cream, Apply topically at bedtime., Disp: 45 g, Rfl: 5   triamcinolone cream (KENALOG) 0.1 %, Apply 1 application topically 2 (two) times daily., Disp: 30 g, Rfl: 1   VITAMIN  D PO, Take by mouth., Disp: , Rfl:    Objective:     Vitals:   02/21/22 1015  Weight: 117 lb (53.1 kg)  Height: '5\' 6"'$  (1.676 m)      Body mass index is 18.88 kg/m.    Physical Exam:    Gen: Appears well, nad, nontoxic and pleasant Psych: Alert and oriented, appropriate mood and affect Neuro: sensation intact, strength is 5/5 with df/pf/inv/ev, muscle tone wnl Skin: no susupicious lesions or rashes  Right foot/ankle: no deformity.  Moderate swelling and diffuse ecchymosis through the third digit with TTP at MTP, PIP, DIP.  Pain along third digit with flexion and extension, though full ROM.   Sensation intact NTTP over fibular head, lat mal, medial mal, achilles, navicular, base of 5th, ATFL, CFL, deltoid, calcaneous or midfoot    Electronically signed by:  Regina Mendez D.[facility] 10:26 AM 02/21/22

## 2022-04-01 ENCOUNTER — Telehealth: Payer: Self-pay | Admitting: Sports Medicine

## 2022-04-01 NOTE — Telephone Encounter (Signed)
Relayed Dr. Glennon Mac message via [employer]

## 2022-04-01 NOTE — Telephone Encounter (Signed)
Patient called asking if she would be okay to stop wearing the flat foot/boot. She is thinking that maybe this is causing her pain due to walking unevenly.

## 2022-04-01 NOTE — Telephone Encounter (Signed)
Patient called stating that her toe is still painful, a little red, but the swelling has gone down. She asked if it would be okay for her to start exercising? She said that she is starting to have pain in her hip because she has not been abel to exercise so she would like to start back if that is okay?  Please advise.

## 2022-05-02 ENCOUNTER — Telehealth: Payer: Self-pay | Admitting: Internal Medicine

## 2022-05-02 NOTE — Telephone Encounter (Signed)
Pt called to say she is feeling extremely tired and would like blood work to check for anemia, high cholesterol, B12, etc.  Pt would like to request lab orders.   Pt would like a call back to schedule, once orders are put in.  Pt was informed that MD is OOO today.  Pt is not due for a CPE until August 2024.  Please advise.

## 2022-05-03 ENCOUNTER — Ambulatory Visit: Payer: BC Managed Care – PPO | Admitting: Sports Medicine

## 2022-05-03 VITALS — HR 80 | Ht 66.0 in | Wt 117.0 lb

## 2022-05-03 DIAGNOSIS — M79671 Pain in right foot: Secondary | ICD-10-CM | POA: Diagnosis not present

## 2022-05-03 MED ORDER — MELOXICAM 7.5 MG PO TABS: 7.5000 mg | ORAL_TABLET | Freq: Every day | ORAL | 0 refills | Status: DC

## 2022-05-03 NOTE — Telephone Encounter (Signed)
Office visit scheduled.

## 2022-05-03 NOTE — Patient Instructions (Addendum)
Good to see you - Start meloxicam 7.5 mg daily x3 weeks.  Do not to use additional NSAIDs while taking meloxicam.  May use Tylenol [phone] mg 2 to 3 times a day for breakthrough pain. Warm foot baths  Wear comfortable and supportive tennis shoes As needed follow up

## 2022-05-03 NOTE — Progress Notes (Signed)
Benito Mccreedy D.[place] Phone: [phone]   Assessment and Plan:     1. Right foot pain  -Chronic with exacerbation, subsequent visit - Patient is still having intermittent pain in right third digit and occasionally in right second digit and fifth digit ever since contusion in 01/2022.  Most consistent with contusion but has not fully resolved likely due to repeated trauma while walking and being physically active - Patient did not pick up meloxicam medication as directed at previous office visit.  Will provide a new prescription today - Start meloxicam 7.5 mg daily x 3 weeks.  May use remaining meloxicam as needed once daily for pain control.  Do not to use additional NSAIDs while taking meloxicam.  May use Tylenol [phone] mg 2 to 3 times a day for breakthrough pain. - Recommend warm foot baths and wearing supportive and comfortable tennis shoes during ambulation  Pertinent previous records reviewed include none   Follow Up: As needed   Subjective:   I, Moenique Parris, am serving as a Education administrator for Doctor Glennon Mac   Chief Complaint: right toe pain    HPI:    02/22/2022 Patient is a 62 year old female complaining of right toe pain . Patient states that she moved from the chair she hit something and then she had pain , pain with flexion, swollen and bruised, yesterday is when it happened, no numbness or tingling, ib for the pain and that maybe helped   05/03/2022 Patient states that she is still in pain and swollen     Relevant Historical Information: None pertinent    Additional pertinent review of systems negative.   Current Outpatient Medications:    Ascorbic Acid (VITAMIN C) 100 MG tablet, Take 100 mg by mouth daily., Disp: , Rfl:    clindamycin (CLEOCIN-T) 1 % external solution, Apply topically 2 (two) times daily., Disp: 30 mL, Rfl: 5   Cyanocobalamin (VITAMIN B 12) 500 MCG TABS, 1 tablet  Orally Once a day for 30 day(s), Disp: , Rfl:    Flaxseed, Linseed, (FLAX SEED OIL PO), Take by mouth., Disp: , Rfl:    ibuprofen (ADVIL) 200 MG tablet, 1 tablet with food or milk as needed Orally Three times a day, Disp: , Rfl:    ipratropium (ATROVENT) 0.03 % nasal spray, Place 2 sprays into both nostrils every 12 (twelve) hours., Disp: 30 mL, Rfl: 3   Multiple Vitamins-Minerals (HAIR/SKIN/NAILS PO), Take by mouth., Disp: , Rfl:    Omega-3 Fatty Acids (FISH OIL) 1000 MG CAPS, 1 capsule Orally Once a day, Disp: , Rfl:    OVER THE COUNTER MEDICATION, Krim Oil Ginger pill Iodine in kelp Marshmallow root, Disp: , Rfl:    PROAIR HFA 108 (90 Base) MCG/ACT inhaler, USE 2 PUFFS EVERY 6 HOURS AS NEEDED., Disp: 8.5 g, Rfl: 2   Probiotic Product (PROBIOTIC-10 PO), Take by mouth., Disp: , Rfl:    QVAR REDIHALER 40 MCG/ACT inhaler, INHALE 2 PUFFS INTO THE LUNGS TWICE DAILY, Disp: 8.7 g, Rfl: 2   tretinoin (RETIN-A) 0.05 % cream, Apply topically at bedtime., Disp: 45 g, Rfl: 5   triamcinolone cream (KENALOG) 0.1 %, Apply 1 application topically 2 (two) times daily., Disp: 30 g, Rfl: 1   VITAMIN D PO, Take by mouth., Disp: , Rfl:    meloxicam (MOBIC) 7.5 MG tablet, Take 1 tablet (7.5 mg total) by mouth daily., Disp: 30 tablet, Rfl: 0  Objective:     Vitals:   05/03/22 0910  Pulse: 80  SpO2: 97%  Weight: 117 lb (53.1 kg)  Height: 5\' 6"  (1.676 m)      Body mass index is 18.88 kg/m.    Physical Exam:    Gen: Appears well, nad, nontoxic and pleasant Psych: Alert and oriented, appropriate mood and affect Neuro: sensation intact, strength is 5/5 with df/pf/inv/ev, muscle tone wnl Skin: no susupicious lesions or rashes   Right foot/ankle: no deformity.  mild swelling and darkened skin through the third digit with TTP at MTP, PIP, DIP.    Sensation intact NTTP over fibular head, lat mal, medial mal, achilles, navicular, base of 5th, ATFL, CFL, deltoid, calcaneous or midfoot     Electronically  signed by:  Benito Mccreedy D.[facility] 9:35 AM 05/03/22

## 2022-05-09 ENCOUNTER — Ambulatory Visit: Payer: BC Managed Care – PPO | Admitting: Family Medicine

## 2022-05-11 ENCOUNTER — Ambulatory Visit: Payer: BC Managed Care – PPO | Admitting: Internal Medicine

## 2022-09-15 ENCOUNTER — Telehealth: Payer: Self-pay | Admitting: Internal Medicine

## 2022-09-15 ENCOUNTER — Telehealth: Payer: Self-pay | Admitting: *Deleted

## 2022-09-15 DIAGNOSIS — Z9189 Other specified personal risk factors, not elsewhere classified: Secondary | ICD-10-CM

## 2022-09-15 NOTE — Telephone Encounter (Signed)
Pt is calling and does not want to until nov for cpe with dr Ardyth Harps. Pt is requesting cpe after 09-29-2022

## 2022-09-15 NOTE — Telephone Encounter (Signed)
Call returned to patient. No answer, no voicemail set up.   Patient left voicemail requesting to schedule earlier visit.

## 2022-09-16 NOTE — Telephone Encounter (Signed)
Spoke with patient. Patient expressed frustration with wait for next AEX. High risk for breast cancer and Hx of cervical dysplasia. Last AEX 08/17/21. Last Screening MMG 12/03/2021. Denies any current concerns or issues. States she is anxious due to her history.   Offered AEX on 8/21 at 1430, patient declined due to work schedule. Requesting a Tuesday or Thursday. Scheduled next available on 11/01/22 at 1500. Placed on waitlist.   Patient asking for new order for screening breast MRI, she did not schedule previously. Order placed to [facility] per patient request, she will call to schedule.   Per review of EPIC, last MRI at [facility] on 07/15/19. Per review of last AEX LTR 33%.   Advised I will provide update to Dr. Edward Jolly. Patient appreciative of return call.   Routing to provider for final review. Patient is agreeable to disposition. Will close encounter.

## 2022-09-20 ENCOUNTER — Telehealth: Payer: Self-pay | Admitting: Internal Medicine

## 2022-09-28 ENCOUNTER — Telehealth: Payer: Self-pay | Admitting: Internal Medicine

## 2022-09-28 NOTE — Telephone Encounter (Signed)
Pt call and stated she want you to give her a call back she stated she was told that you would call her back.

## 2022-09-29 ENCOUNTER — Telehealth: Payer: Self-pay | Admitting: Internal Medicine

## 2022-09-29 ENCOUNTER — Other Ambulatory Visit: Payer: Self-pay | Admitting: Internal Medicine

## 2022-09-29 DIAGNOSIS — E785 Hyperlipidemia, unspecified: Secondary | ICD-10-CM

## 2022-09-29 NOTE — Telephone Encounter (Signed)
Pt called and stated she has been trying to get in contact with the nurse for a few days. She is requesting a call back. She states she needs an annual physical, however declined to schedule with me, she states the next appointment is too far out.

## 2022-09-29 NOTE — Telephone Encounter (Signed)
Spoke with patient and a lab appointment scheduled for lipid.

## 2022-10-04 NOTE — Telephone Encounter (Signed)
Pt called and wanted to know why we called her this morning, when I asked what she was inquiring about, she said that I should know, that I can see her chart.  Please advise pt regarding the phone call.

## 2022-10-04 NOTE — Telephone Encounter (Signed)
Pt is sch for 10-18-2022 1030 am

## 2022-10-04 NOTE — Telephone Encounter (Signed)
Pt called in and stated that someone called her from the office today. I informed her that I believe it was the reminder call from her lab appt on 9/5, however she insisted that it was someone calling her to schedule a physical appointment. She told me she didn't know why she was scheduled for a lab appointment on 9/5 and requested that I cancel that appointment. She states she was told she would be worked in by Dr. Ardyth Harps for a physical appointment. She would like a call back.

## 2022-10-05 NOTE — Telephone Encounter (Signed)
I called pt twice her vm was not set up unable to leave a message . I finally was able to reached the pt and cpe was sch 10-18-2022

## 2022-10-06 ENCOUNTER — Other Ambulatory Visit: Payer: BC Managed Care – PPO

## 2022-10-18 ENCOUNTER — Encounter: Payer: Self-pay | Admitting: Internal Medicine

## 2022-10-18 ENCOUNTER — Ambulatory Visit (INDEPENDENT_AMBULATORY_CARE_PROVIDER_SITE_OTHER): Payer: BC Managed Care – PPO | Admitting: Internal Medicine

## 2022-10-18 VITALS — BP 118/76 | HR 76 | Temp 98.2°F | Ht 66.0 in | Wt 119.3 lb

## 2022-10-18 DIAGNOSIS — E559 Vitamin D deficiency, unspecified: Secondary | ICD-10-CM | POA: Diagnosis not present

## 2022-10-18 DIAGNOSIS — R7302 Impaired glucose tolerance (oral): Secondary | ICD-10-CM

## 2022-10-18 DIAGNOSIS — Z23 Encounter for immunization: Secondary | ICD-10-CM | POA: Diagnosis not present

## 2022-10-18 DIAGNOSIS — R7989 Other specified abnormal findings of blood chemistry: Secondary | ICD-10-CM

## 2022-10-18 DIAGNOSIS — L700 Acne vulgaris: Secondary | ICD-10-CM

## 2022-10-18 DIAGNOSIS — Z0000 Encounter for general adult medical examination without abnormal findings: Secondary | ICD-10-CM

## 2022-10-18 DIAGNOSIS — E538 Deficiency of other specified B group vitamins: Secondary | ICD-10-CM | POA: Diagnosis not present

## 2022-10-18 DIAGNOSIS — E785 Hyperlipidemia, unspecified: Secondary | ICD-10-CM | POA: Diagnosis not present

## 2022-10-18 DIAGNOSIS — Z114 Encounter for screening for human immunodeficiency virus [HIV]: Secondary | ICD-10-CM

## 2022-10-18 DIAGNOSIS — Z1159 Encounter for screening for other viral diseases: Secondary | ICD-10-CM

## 2022-10-18 LAB — COMPREHENSIVE METABOLIC PANEL
ALT: 18 U/L (ref 0–35)
AST: 24 U/L (ref 0–37)
Albumin: 4.1 g/dL (ref 3.5–5.2)
Alkaline Phosphatase: 57 U/L (ref 39–117)
BUN: 14 mg/dL (ref 6–23)
CO2: 29 meq/L (ref 19–32)
Calcium: 9.5 mg/dL (ref 8.4–10.5)
Chloride: 103 meq/L (ref 96–112)
Creatinine, Ser: 0.8 mg/dL (ref 0.40–1.20)
GFR: 79.47 mL/min (ref >60.00)
Glucose, Bld: 97 mg/dL (ref 70–99)
Potassium: 4.7 meq/L (ref 3.5–5.1)
Sodium: 138 meq/L (ref 135–145)
Total Bilirubin: 0.4 mg/dL (ref 0.2–1.2)
Total Protein: 7.2 g/dL (ref 6.0–8.3)

## 2022-10-18 LAB — LIPID PANEL
Cholesterol: 204 mg/dL — ABNORMAL HIGH (ref 0–200)
HDL: 62 mg/dL (ref >39.00)
LDL Cholesterol: 126 mg/dL — ABNORMAL HIGH (ref 0–99)
NonHDL: 141.89
Total CHOL/HDL Ratio: 3
Triglycerides: 79 mg/dL (ref 0.0–149.0)
VLDL: 15.8 mg/dL (ref 0.0–40.0)

## 2022-10-18 LAB — CBC WITH DIFFERENTIAL/PLATELET
Basophils Absolute: 0 10*3/uL (ref 0.0–0.1)
Basophils Relative: 0.8 % (ref 0.0–3.0)
Eosinophils Absolute: 0.1 10*3/uL (ref 0.0–0.7)
Eosinophils Relative: 2.2 % (ref 0.0–5.0)
HCT: 42.1 % (ref 36.0–46.0)
Hemoglobin: 13.9 g/dL (ref 12.0–15.0)
Lymphocytes Relative: 30.7 % (ref 12.0–46.0)
Lymphs Abs: 1.5 10*3/uL (ref 0.7–4.0)
MCHC: 33.1 g/dL (ref 30.0–36.0)
MCV: 95.4 fl (ref 78.0–100.0)
Monocytes Absolute: 0.5 10*3/uL (ref 0.1–1.0)
Monocytes Relative: 11.4 % (ref 3.0–12.0)
Neutro Abs: 2.7 10*3/uL (ref 1.4–7.7)
Neutrophils Relative %: 54.9 % (ref 43.0–77.0)
Platelets: 244 10*3/uL (ref 150.0–400.0)
RBC: 4.41 Mil/uL (ref 3.87–5.11)
RDW: 13.1 % (ref 11.5–15.5)
WBC: 4.8 10*3/uL (ref 4.0–10.5)

## 2022-10-18 LAB — VITAMIN D 25 HYDROXY (VIT D DEFICIENCY, FRACTURES): VITD: 54.08 ng/mL (ref 30.00–100.00)

## 2022-10-18 LAB — TSH: TSH: 1.32 u[IU]/mL (ref 0.35–5.50)

## 2022-10-18 LAB — HEMOGLOBIN A1C: Hgb A1c MFr Bld: 5.7 % (ref 4.6–6.5)

## 2022-10-18 LAB — VITAMIN B12: Vitamin B-12: 1206 pg/mL — ABNORMAL HIGH (ref 211–911)

## 2022-10-18 MED ORDER — CLINDAMYCIN PHOS-BENZOYL PEROX 1-5 % EX GEL: Freq: Two times a day (BID) | CUTANEOUS | 2 refills | Status: AC

## 2022-10-18 NOTE — Addendum Note (Signed)
Addended by: Kern Reap B on: 10/18/2022 11:02 AM   Modules accepted: Orders

## 2022-10-18 NOTE — Progress Notes (Unsigned)
62 y.o. G92P2002 Married Caucasian/Hispanic female here for annual exam.    Has some facial hair.  Will seek a new dermatologist.   Using a vibrator.   Went to [place] for 3 months.   PCP:   Dr. Philip Aspen  Patient's last menstrual period was 08/13/2012.           Sexually active: No.  The current method of family planning is post menopausal status.    Exercising: Yes.     Yoga, dancing, stretching, swimming Smoker:  no  Health Maintenance: Pap:  08/18/21 neg: HR HPV neg, 05/09/17 neg: HR HPV neg History of abnormal Pap:  yes, Hx of cryotherapy.  Follow up paps normal.  Colposcopic biopsy report in Epic for 08/17/00  showing LGSIL.  MMG:  12/17/21 Breast Density Cat D, BI-RADS CAT 2 benign.  Having MRI on Wednesday, Colonoscopy:  05/04/17 - due in 10 years per patient.  BMD:  2010 Result  normal TDaP:  06/18/21 Gardasil:   no HIV: neg 10/18/22 Hep C:  nonreactive 10/18/22.  Screening Labs: PCP Hb today: ***, Urine today: ***   reports that she has never smoked. She has never used smokeless tobacco. She reports current alcohol use of about 1.0 standard drink of alcohol per week. She reports that she does not use drugs.  Past Medical History:  Diagnosis Date   Acne    At high risk for breast cancer    33% lifetime risk, negative genetic testing [facility].   Breast cyst    COVID 05/2020   Dry mouth and eyes    Migraine headache     Past Surgical History:  Procedure Laterality Date   APPENDECTOMY     BREAST SURGERY     right breast bx   BREAST SURGERY     Right lumpectomy -dysplastic cells-2018   COLPOSCOPY     GYNECOLOGIC CRYOSURGERY  age 66   OVARIAN CYST SURGERY     TONSILLECTOMY      Current Outpatient Medications  Medication Sig Dispense Refill   Ascorbic Acid (VITAMIN C) 100 MG tablet Take 100 mg by mouth daily.     Biotin POWD Take by mouth.     clindamycin (CLEOCIN-T) 1 % external solution Apply topically 2 (two) times daily. 30 mL 5   clindamycin-benzoyl  peroxide (BENZACLIN) gel Apply topically 2 (two) times daily. 25 g 2   Cyanocobalamin (VITAMIN B 12) 500 MCG TABS 1 tablet Orally Once a day for 30 day(s)     EVENING PRIMROSE OIL PO Take by mouth.     Flaxseed, Linseed, (FLAX SEED OIL PO) Take by mouth.     ibuprofen (ADVIL) 200 MG tablet 1 tablet with food or milk as needed Orally Three times a day     ipratropium (ATROVENT) 0.03 % nasal spray Place 2 sprays into both nostrils every 12 (twelve) hours. 30 mL 3   meloxicam (MOBIC) 7.5 MG tablet Take 1 tablet (7.5 mg total) by mouth daily. 30 tablet 0   Multiple Vitamins-Minerals (HAIR/SKIN/NAILS PO) Take by mouth.     Omega-3 Fatty Acids (FISH OIL) 1000 MG CAPS 1 capsule Orally Once a day     OVER THE COUNTER MEDICATION Krim Oil Ginger pill Iodine in kelp Marshmallow root     PROAIR HFA 108 (90 Base) MCG/ACT inhaler USE 2 PUFFS EVERY 6 HOURS AS NEEDED. 8.5 g 2   Probiotic Product (PROBIOTIC-10 PO) Take by mouth.     QVAR REDIHALER 40 MCG/ACT inhaler INHALE 2  PUFFS INTO THE LUNGS TWICE DAILY 8.7 g 2   tretinoin (RETIN-A) 0.05 % cream Apply topically at bedtime. 45 g 5   triamcinolone cream (KENALOG) 0.1 % Apply 1 application topically 2 (two) times daily. 30 g 1   VITAMIN D PO Take by mouth.     No current facility-administered medications for this visit.    Family History  Problem Relation Age of Onset   Thyroid cancer Mother    Hypertension Father    Heart disease Father    Breast cancer Cousin        Mat. 1st cousins X 2     Age 73's    Review of Systems  All other systems reviewed and are negative.   Exam:   BP (!) 96/54 (BP Location: Left Arm, Patient Position: Sitting, Cuff Size: Normal)   Pulse 91   Ht 5' 5.5" (1.664 m)   Wt 119 lb (54 kg)   LMP 08/13/2012 Comment: gyn  SpO2 96%   BMI 19.50 kg/m     General appearance: alert, cooperative and appears stated age Head: normocephalic, without obvious abnormality, atraumatic Neck: no adenopathy, supple, symmetrical,  trachea midline and thyroid normal to inspection and palpation Lungs: clear to auscultation bilaterally Breasts: normal appearance, no masses or tenderness, No nipple retraction or dimpling, No nipple discharge or bleeding, No axillary adenopathy Heart: regular rate and rhythm Abdomen: soft, non-tender; no masses, no organomegaly Extremities: extremities normal, atraumatic, no cyanosis or edema Skin: skin color, texture, turgor normal. No rashes or lesions Lymph nodes: cervical, supraclavicular, and axillary nodes normal. Neurologic: grossly normal  Pelvic: External genitalia:  no lesions              No abnormal inguinal nodes palpated.              Urethra:  normal appearing urethra with no masses, tenderness or lesions              Bartholins and Skenes: normal                 Vagina: normal appearing vagina with normal color and discharge, no lesions              Cervix:  4 mm area of mildly raised erythema at 5 - 6:00.               Pap taken: yes Bimanual Exam:  Uterus:  normal size, contour, position, consistency, mobility, non-tender              Adnexa: no mass, fullness, tenderness              Rectal exam: yes.  Confirms.              Anus:  normal sphincter tone, no lesions  Chaperone was present for exam:  Warren Lacy, CMA  Assessment:   Well woman visit with gynecologic exam. 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 Status post excisional biopsy 02/22/16 showing PASH and focal atypia. Increased lifetime risk of breast cancer, 33% by Tyrer-Cuzick model. Negative genetic testing.  (See [facility] noted from 04/10/18.) Small cervical lesion.  May be atrophy. Remote history of cervical cryotherapy.  Hx LGSIL of cervix.   Plan: Mammogram screening discussed. Self breast awareness reviewed. Pap and HR HPV as above. Guidelines for Calcium, Vitamin D, regular exercise program including cardiovascular and weight bearing exercise.   Follow up annually and prn.    Additional counseling given.  {yes [id number]. _______  minutes face to face time of which over 50% was spent in counseling.    After visit summary provided.

## 2022-10-18 NOTE — Progress Notes (Signed)
Established Patient Office Visit     CC/Reason for Visit: Annual preventive exam  HPI: Regina Mendez is a 62 y.o. female who is coming in today for the above mentioned reasons. Past Medical History is significant for: Hyperlipidemia, impaired glucose tolerance, cervical dysplasia followed by GYN.  Has routine eye and dental care.  Is due for flu, COVID, RSV vaccines.  Is now due for her breast cancer screening.  She does every other year MRIs due to high risk requesting prescription for clindamycin gel for acne.  Had a colonoscopy in 2019.   Past Medical/Surgical History: Past Medical History:  Diagnosis Date   Acne    At high risk for breast cancer    33% lifetime risk, negative genetic testing [facility].   Breast cyst    COVID 05/2020   Dry mouth and eyes    Migraine headache     Past Surgical History:  Procedure Laterality Date   APPENDECTOMY     BREAST SURGERY     right breast bx   BREAST SURGERY     Right lumpectomy -dysplastic cells-2018   COLPOSCOPY     GYNECOLOGIC CRYOSURGERY  age 36   OVARIAN CYST SURGERY     TONSILLECTOMY      Social History:  reports that she has never smoked. She has never used smokeless tobacco. She reports current alcohol use of about 1.0 standard drink of alcohol per week. She reports that she does not use drugs.  Allergies: Allergies  Allergen Reactions   Codeine Other (See Comments)    Drowsy   Sulfonamide Derivatives Dermatitis    Family History:  Family History  Problem Relation Age of Onset   Thyroid cancer Mother    Hypertension Father    Heart disease Father    Breast cancer Cousin        Mat. 1st cousins X 2     Age 63's     Current Outpatient Medications:    Ascorbic Acid (VITAMIN C) 100 MG tablet, Take 100 mg by mouth daily., Disp: , Rfl:    clindamycin (CLEOCIN-T) 1 % external solution, Apply topically 2 (two) times daily., Disp: 30 mL, Rfl: 5   Cyanocobalamin (VITAMIN B 12) 500 MCG TABS, 1 tablet Orally  Once a day for 30 day(s), Disp: , Rfl:    Flaxseed, Linseed, (FLAX SEED OIL PO), Take by mouth., Disp: , Rfl:    ibuprofen (ADVIL) 200 MG tablet, 1 tablet with food or milk as needed Orally Three times a day, Disp: , Rfl:    ipratropium (ATROVENT) 0.03 % nasal spray, Place 2 sprays into both nostrils every 12 (twelve) hours., Disp: 30 mL, Rfl: 3   meloxicam (MOBIC) 7.5 MG tablet, Take 1 tablet (7.5 mg total) by mouth daily., Disp: 30 tablet, Rfl: 0   Multiple Vitamins-Minerals (HAIR/SKIN/NAILS PO), Take by mouth., Disp: , Rfl:    Omega-3 Fatty Acids (FISH OIL) 1000 MG CAPS, 1 capsule Orally Once a day, Disp: , Rfl:    OVER THE COUNTER MEDICATION, Krim Oil Ginger pill Iodine in kelp Marshmallow root, Disp: , Rfl:    PROAIR HFA 108 (90 Base) MCG/ACT inhaler, USE 2 PUFFS EVERY 6 HOURS AS NEEDED., Disp: 8.5 g, Rfl: 2   Probiotic Product (PROBIOTIC-10 PO), Take by mouth., Disp: , Rfl:    QVAR REDIHALER 40 MCG/ACT inhaler, INHALE 2 PUFFS INTO THE LUNGS TWICE DAILY, Disp: 8.7 g, Rfl: 2   tretinoin (RETIN-A) 0.05 % cream, Apply topically at bedtime.,  Disp: 45 g, Rfl: 5   triamcinolone cream (KENALOG) 0.1 %, Apply 1 application topically 2 (two) times daily., Disp: 30 g, Rfl: 1   VITAMIN D PO, Take by mouth., Disp: , Rfl:    clindamycin-benzoyl peroxide (BENZACLIN) gel, Apply topically 2 (two) times daily., Disp: 25 g, Rfl: 2  Review of Systems:  Negative unless indicated in HPI.   Physical Exam: Vitals:   10/18/22 1037  BP: 118/76  Pulse: 76  Temp: 98.2 F (36.8 C)  TempSrc: Oral  SpO2: 98%  Weight: 119 lb 4.8 oz (54.1 kg)  Height: 5\' 6"  (1.676 m)    Body mass index is 19.26 kg/m.   Physical Exam Vitals reviewed.  Constitutional:      General: She is not in acute distress.    Appearance: Normal appearance. She is not ill-appearing, toxic-appearing or diaphoretic.  HENT:     Head: Normocephalic.     Right Ear: Tympanic membrane, ear canal and external ear normal. There is no  impacted cerumen.     Left Ear: Tympanic membrane, ear canal and external ear normal. There is no impacted cerumen.     Nose: Nose normal.     Mouth/Throat:     Mouth: Mucous membranes are moist.     Pharynx: Oropharynx is clear. No oropharyngeal exudate or posterior oropharyngeal erythema.  Eyes:     General: No scleral icterus.       Right eye: No discharge.        Left eye: No discharge.     Conjunctiva/sclera: Conjunctivae normal.     Pupils: Pupils are equal, round, and reactive to light.  Neck:     Vascular: No carotid bruit.  Cardiovascular:     Rate and Rhythm: Normal rate and regular rhythm.     Pulses: Normal pulses.     Heart sounds: Normal heart sounds.  Pulmonary:     Effort: Pulmonary effort is normal. No respiratory distress.     Breath sounds: Normal breath sounds.  Abdominal:     General: Abdomen is flat. Bowel sounds are normal.     Palpations: Abdomen is soft.  Musculoskeletal:        General: Normal range of motion.     Cervical back: Normal range of motion.  Skin:    General: Skin is warm and dry.  Neurological:     General: No focal deficit present.     Mental Status: She is alert and oriented to person, place, and time. Mental status is at baseline.  Psychiatric:        Mood and Affect: Mood normal.        Behavior: Behavior normal.        Thought Content: Thought content normal.        Judgment: Judgment normal.     Flowsheet Row Office Visit from 10/18/2022 in [facility] [place] [facility]  PHQ-9 Total Score 0        Impression and Plan:  Hyperlipidemia, unspecified hyperlipidemia type -     CBC with Differential/Platelet; Future -     Comprehensive metabolic panel; Future -     Lipid panel; Future  IGT (impaired glucose tolerance) -     Hemoglobin A1c; Future  Abnormal TSH -     TSH; Future  Encounter for preventive health examination  Vitamin D deficiency -     VITAMIN D 25 Hydroxy (Vit-D Deficiency, Fractures);  Future  Immunization due  Encounter for hepatitis C screening test for  low risk patient -     Hepatitis C antibody; Future  Encounter for screening for HIV -     HIV Antibody (routine testing w rflx); Future  Vitamin B12 deficiency -     Vitamin B12; Future  Acne vulgaris -     Clindamycin Phos-Benzoyl Perox; Apply topically 2 (two) times daily.  Dispense: 25 g; Refill: 2   -Recommend routine eye and dental care. -Healthy lifestyle discussed in detail. -Labs to be updated today. -Prostate cancer screening: N/A Health Maintenance  Topic Date Due   HIV Screening  Never done   Hepatitis C Screening  Never done   Flu Shot  09/01/2022   COVID-19 Vaccine (4 - 2023-24 season) 10/02/2022   Mammogram  12/02/2023   Pap with HPV screening  08/19/2026   Colon Cancer Screening  05/05/2027   DTaP/Tdap/Td vaccine (4 - Td or Tdap) 06/19/2031   Zoster (Shingles) Vaccine  Completed   HPV Vaccine  Aged Out     -Flu vaccine administered today.  Advised to update COVID and RSV vaccines at pharmacy. -Already has follow-up scheduled for her breast MRI.     Chaya Jan, MD [facility]

## 2022-10-20 LAB — HIV ANTIBODY (ROUTINE TESTING W REFLEX): HIV 1&2 Ab, 4th Generation: NONREACTIVE

## 2022-10-20 LAB — HEPATITIS C ANTIBODY: Hepatitis C Ab: NONREACTIVE

## 2022-10-24 ENCOUNTER — Encounter: Payer: Self-pay | Admitting: Internal Medicine

## 2022-10-24 NOTE — Telephone Encounter (Signed)
Pt called to request a call back to discuss her labs.

## 2022-10-25 NOTE — Telephone Encounter (Signed)
Spoke with patient and she states that on her AVS it says she has a bad TSH.  I reviewed her lab results with her. Any advise?

## 2022-11-01 ENCOUNTER — Other Ambulatory Visit (HOSPITAL_COMMUNITY)
Admission: RE | Admit: 2022-11-01 | Discharge: 2022-11-01 | Disposition: A | Discharge status: Home / Self Care | Payer: BC Managed Care – PPO | Source: Ambulatory Visit | Attending: Obstetrics and Gynecology | Admitting: Obstetrics and Gynecology

## 2022-11-01 ENCOUNTER — Encounter: Payer: Self-pay | Admitting: Obstetrics and Gynecology

## 2022-11-01 ENCOUNTER — Ambulatory Visit (INDEPENDENT_AMBULATORY_CARE_PROVIDER_SITE_OTHER): Payer: BC Managed Care – PPO | Admitting: Obstetrics and Gynecology

## 2022-11-01 VITALS — BP 96/54 | HR 91 | Ht 65.5 in | Wt 119.0 lb

## 2022-11-01 DIAGNOSIS — N889 Noninflammatory disorder of cervix uteri, unspecified: Secondary | ICD-10-CM | POA: Insufficient documentation

## 2022-11-01 DIAGNOSIS — Z01419 Encounter for gynecological examination (general) (routine) without abnormal findings: Secondary | ICD-10-CM

## 2022-11-01 NOTE — Patient Instructions (Signed)

## 2022-11-03 ENCOUNTER — Ambulatory Visit
Admission: RE | Admit: 2022-11-03 | Discharge: 2022-11-03 | Disposition: A | Discharge status: Home / Self Care | Payer: BC Managed Care – PPO | Source: Ambulatory Visit | Attending: Obstetrics and Gynecology | Admitting: Obstetrics and Gynecology

## 2022-11-03 DIAGNOSIS — Z9189 Other specified personal risk factors, not elsewhere classified: Secondary | ICD-10-CM

## 2022-11-03 MED ORDER — GADOPICLENOL 0.5 MMOL/ML IV SOLN
5.0000 mL | Freq: Once | INTRAVENOUS | Status: AC | PRN
  Administered 2022-11-03: 5 mL via INTRAVENOUS

## 2022-11-03 NOTE — Progress Notes (Signed)
GYNECOLOGY  VISIT   HPI: 62 y.o.   Married  Caucasian  female   G2P2002 with Patient's last menstrual period was 08/13/2012.   here for   colposcopy for persistent cervical lesion noted with routine exam.   GYNECOLOGIC HISTORY: Patient's last menstrual period was 08/13/2012. Contraception:  PMP Menopausal hormone therapy:  n/a Last mammogram:  12/17/21 Breast Density Cat D,  BI-RADS CAT 2 benign Last pap smear:   11/01/22 neg: HR HPV neg, 08/18/21 neg: HR HPV neg        OB History     Gravida  2   Para  2   Term  2   Preterm      AB      Living  2      SAB      IAB      Ectopic      Multiple      Live Births                 Patient Active Problem List   Diagnosis Date Noted   Need for Tdap vaccination 06/18/2021   Contusion of left hand 06/18/2021   IGT (impaired glucose tolerance) 07/15/2020   Hyperlipidemia 07/15/2020   Vitamin D deficiency 07/17/2018   History of colon polyps 10/12/2016   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   Breast cyst    COVID 05/2020   Dry mouth and eyes    Migraine headache     Past Surgical History:  Procedure Laterality Date   APPENDECTOMY     BREAST SURGERY     right breast bx   BREAST SURGERY     Right lumpectomy -dysplastic cells-2018   COLPOSCOPY     GYNECOLOGIC CRYOSURGERY  age 33   OVARIAN CYST SURGERY     TONSILLECTOMY      Current Outpatient Medications  Medication Sig Dispense Refill   Ascorbic Acid (VITAMIN C) 100 MG tablet Take 100  mg by mouth daily.     Biotin POWD Take by mouth.     clindamycin (CLEOCIN-T) 1 % external solution Apply topically 2 (two) times daily. 30 mL 5   clindamycin-benzoyl peroxide (BENZACLIN) gel Apply topically 2 (two) times daily. 25 g 2   Cyanocobalamin (VITAMIN B 12) 500 MCG TABS 1 tablet Orally Once a day for 30 day(s)     EVENING PRIMROSE OIL PO Take by mouth.     Flaxseed, Linseed, (FLAX SEED OIL PO) Take by mouth.     ibuprofen (ADVIL) 200 MG tablet 1 tablet with food or milk as needed Orally Three times a day     ipratropium (ATROVENT) 0.03 % nasal spray Place 2 sprays into both nostrils every 12 (twelve) hours. 30 mL 3   meloxicam (MOBIC) 7.5 MG tablet Take 1 tablet (7.5 mg total) by mouth daily. 30 tablet 0   Multiple Vitamins-Minerals (HAIR/SKIN/NAILS PO) Take by mouth.     Omega-3 Fatty Acids (FISH OIL) 1000 MG CAPS 1 capsule Orally  Once a day     OVER THE COUNTER MEDICATION Krim Oil Ginger pill Iodine in kelp Marshmallow root     PROAIR HFA 108 (90 Base) MCG/ACT inhaler USE 2 PUFFS EVERY 6 HOURS AS NEEDED. 8.5 g 2   Probiotic Product (PROBIOTIC-10 PO) Take by mouth.     QVAR REDIHALER 40 MCG/ACT inhaler INHALE 2 PUFFS INTO THE LUNGS TWICE DAILY 8.7 g 2   tretinoin (RETIN-A) 0.05 % cream Apply topically at bedtime. 45 g 5   triamcinolone cream (KENALOG) 0.1 % Apply 1 application topically 2 (two) times daily. 30 g 1   VITAMIN D PO Take by mouth.     No current facility-administered medications for this visit.     ALLERGIES: Codeine and Sulfonamide derivatives  Family History  Problem Relation Age of Onset   Thyroid cancer Mother    Hypertension Father    Heart disease Father    Breast cancer Cousin        Mat. 1st cousins X 2     Age 79's    Social History   Socioeconomic History   Marital status: Married    Spouse name: Not on file   Number of children: Not on file   Years of education: Not on file   Highest education level: Not on file  Occupational History    Not on file  Tobacco Use   Smoking status: Never   Smokeless tobacco: Never  Vaping Use   Vaping status: Never Used  Substance and Sexual Activity   Alcohol use: Yes    Alcohol/week: 1.0 standard drink of alcohol    Types: 1 Standard drinks or equivalent per week   Drug use: No   Sexual activity: Not Currently    Birth control/protection: Post-menopausal    Comment: 1st intercourse 62 yo-More than 5 partners  Other Topics Concern   Not on file  Social History Narrative   Not on file   Social Determinants of Health   Financial Resource Strain: Not on file  Food Insecurity: No Food Insecurity (07/15/2021)   [facility] from [facility]   Hunger Vital Sign    Worried About Running Out of Food in the Last Year: Never true    Ran Out of Food in the Last Year: Never true  Transportation Needs: Not on file  Physical Activity: Not on file  Stress: Not on file  Social Connections: Unknown (07/15/2021)   Received from [facility], [facility]   Social Network    Social Network: Not on file  Intimate Partner Violence: Unknown (07/15/2021)   Received from [facility], [facility]   HITS    Physically Hurt: Not on file    Insult or Talk Down To: Not on file    Threaten Physical Harm: Not on file    Scream or Curse: Not on file    Review of Systems  All other systems reviewed and are negative.   PHYSICAL EXAMINATION:    BP 122/78 (BP Location: Right Arm, Patient Position: Sitting, Cuff Size: Normal)   Pulse 87   Wt 119 lb (54 kg)   LMP 08/13/2012 Comment: gyn  SpO2 98%   BMI 19.50 kg/m     General appearance: alert, cooperative and appears stated age   Colposcopy - cervix, vagina. Consent for procedure.  3% acetic acid used in vagina and on cervix. White light and green light filter used.  Findings:    Cervix:  4 mm area of increased vascularity  at 5:00 position.  No acetowhite change.  Vagina:  atrophy noted.  Biopsies:  Hibiclens prep.   Tenaculum to anterior cervix.  biopsy at 5:00. Monsel's placed.  Minimal EBL. No complications.    Chaperone was present for exam:  Warren Lacy, CMA  ASSESSMENT  Lesion of cervix.  Vascular change noted.  Possible sessile nabothian cyst.   PLAN  FU biopsy.  Final plan to follow.

## 2022-11-07 LAB — CYTOLOGY - PAP
Comment: NEGATIVE
Diagnosis: NEGATIVE
High risk HPV: NEGATIVE

## 2022-11-08 ENCOUNTER — Telehealth: Payer: Self-pay | Admitting: *Deleted

## 2022-11-08 NOTE — Telephone Encounter (Signed)
Spoke with patient. Advised of PAP results dated 11/01/22 per Dr. Edward Jolly.   Patient is scheduled for a colpo on 11/17/22. Discussed at [facility]. Patient asking if colposcopy is necessary? She is concerned about pain. Asking if ultrasound would be an option to see changes.   Advised I will forward to Dr. Edward Jolly to review and f/u with recommendations. Patient agreeable.   Dr. Edward Jolly -please advise.

## 2022-11-08 NOTE — Telephone Encounter (Signed)
I recommend proceeding with the colposcopy.   I have seen a recurrent lesion on her cervix in the same general area for the last 2 visits.   The microscope and possible biopsy is the way to evaluate further.   Pelvic ultrasound is not able to assess the cervix.

## 2022-11-09 NOTE — Telephone Encounter (Signed)
Spoke with patient, advised as seen below per Dr. Edward Jolly. Patient will keep colpo as scheduled. Instructed patient to take Motrin 800 mg with food and water one hour before procedure. Patient verbalizes understanding and is agreeable.   Routing to provider for final review. Patient is agreeable to disposition. Will close encounter.

## 2022-11-17 ENCOUNTER — Ambulatory Visit: Payer: BC Managed Care – PPO | Admitting: Obstetrics and Gynecology

## 2022-11-17 ENCOUNTER — Other Ambulatory Visit (HOSPITAL_COMMUNITY)
Admission: RE | Admit: 2022-11-17 | Discharge: 2022-11-17 | Disposition: A | Discharge status: Home / Self Care | Payer: BC Managed Care – PPO | Source: Ambulatory Visit | Attending: Obstetrics and Gynecology | Admitting: Obstetrics and Gynecology

## 2022-11-17 ENCOUNTER — Encounter: Payer: Self-pay | Admitting: Obstetrics and Gynecology

## 2022-11-17 ENCOUNTER — Ambulatory Visit (INDEPENDENT_AMBULATORY_CARE_PROVIDER_SITE_OTHER): Payer: BC Managed Care – PPO | Admitting: Obstetrics and Gynecology

## 2022-11-17 VITALS — BP 122/78 | HR 87 | Wt 119.0 lb

## 2022-11-17 DIAGNOSIS — N889 Noninflammatory disorder of cervix uteri, unspecified: Secondary | ICD-10-CM

## 2022-11-17 NOTE — Patient Instructions (Signed)
Colposcopy, Care After  The following information offers guidance on how to care for yourself after your procedure. Your health care provider may also give you more specific instructions. If you have problems or questions, contact your health care provider. What can I expect after the procedure? If you had a colposcopy without a biopsy, you can expect to feel fine right away after your procedure. However, you may have some spotting of blood for a few days. You can return to your normal activities. If you had a colposcopy with a biopsy, it is common after the procedure to have: Soreness and mild pain. These may last for a few days. Mild vaginal bleeding or discharge that is dark-colored and grainy. This may last for a few days. The discharge may be caused by a liquid (solution) that was used during the procedure. You may need to wear a sanitary pad during this time. Spotting of blood for at least 48 hours after the procedure. Follow these instructions at home: Medicines Take over-the-counter and prescription medicines only as told by your health care provider. Talk with your health care provider about what type of over-the-counter pain medicines and prescription medicines you can start to take again. It is especially important to talk with your health care provider if you take blood thinners. Activity Avoid using douche products, using tampons, and having sex for at least 3 days after the procedure or for as long as told by your health care provider. Return to your normal activities as told by your health care provider. Ask your health care provider what activities are safe for you. General instructions Ask your health care provider if you may take baths, swim, or use a hot tub. You may take showers. If you use birth control (contraception), continue to use it. Keep all follow-up visits. This is important. Contact a health care provider if: You have a fever or chills. You faint or feel  light-headed. Get help right away if: You have heavy bleeding from your vagina or pass blood clots. Heavy bleeding is bleeding that soaks through a sanitary pad in less than 1 hour. You have vaginal discharge that is abnormal, is yellow in color, or smells bad. This could be a sign of infection. You have severe pain or cramps in your lower abdomen that do not go away with medicine. Summary If you had a colposcopy without a biopsy, you can expect to feel fine right away, but you may have some spotting of blood for a few days. You can return to your normal activities. If you had a colposcopy with a biopsy, it is common to have mild pain for a few days and spotting for 48 hours after the procedure. Avoid using douche products, using tampons, and having sex for at least 3 days after the procedure or for as long as told by your health care provider. Get help right away if you have heavy bleeding, severe pain, or signs of infection. This information is not intended to replace advice given to you by your health care provider. Make sure you discuss any questions you have with your health care provider. Document Revised: 06/14/2020 Document Reviewed: 06/14/2020 Elsevier Patient Education  2024 [employer].

## 2022-11-22 LAB — SURGICAL PATHOLOGY

## 2023-01-18 ENCOUNTER — Other Ambulatory Visit: Payer: Self-pay | Admitting: Internal Medicine

## 2023-01-18 DIAGNOSIS — Z0000 Encounter for general adult medical examination without abnormal findings: Secondary | ICD-10-CM

## 2023-01-27 ENCOUNTER — Ambulatory Visit
Admission: RE | Admit: 2023-01-27 | Discharge: 2023-01-27 | Disposition: A | Discharge status: Home / Self Care | Payer: BC Managed Care – PPO | Source: Ambulatory Visit | Attending: Internal Medicine | Admitting: Internal Medicine

## 2023-01-27 DIAGNOSIS — Z0000 Encounter for general adult medical examination without abnormal findings: Secondary | ICD-10-CM

## 2023-05-11 ENCOUNTER — Encounter: Payer: Self-pay | Admitting: Internal Medicine

## 2023-05-11 ENCOUNTER — Ambulatory Visit (HOSPITAL_COMMUNITY): Admission: EM | Admit: 2023-05-11 | Discharge: 2023-05-11 | Disposition: A | Discharge status: Home / Self Care

## 2023-05-11 ENCOUNTER — Encounter (HOSPITAL_COMMUNITY): Payer: Self-pay

## 2023-05-11 ENCOUNTER — Ambulatory Visit: Payer: Self-pay

## 2023-05-11 DIAGNOSIS — S61451A Open bite of right hand, initial encounter: Secondary | ICD-10-CM | POA: Diagnosis not present

## 2023-05-11 DIAGNOSIS — W5501XA Bitten by cat, initial encounter: Secondary | ICD-10-CM | POA: Diagnosis not present

## 2023-05-11 MED ORDER — AMOXICILLIN-POT CLAVULANATE 875-125 MG PO TABS: 1.0000 | ORAL_TABLET | Freq: Two times a day (BID) | ORAL | 0 refills | Status: AC

## 2023-05-11 NOTE — Telephone Encounter (Signed)
 Left message on machine for patient to return our call.  See phone note 05/11/23.

## 2023-05-11 NOTE — Telephone Encounter (Signed)
 Left message on machine for patient to return our call.  PCP is not in the office.   Agree with RN - urgent care or ER.

## 2023-05-11 NOTE — Telephone Encounter (Signed)
 Copied from CRM [id number]. Topic: Clinical - Red Word Triage >> May 11, 2023  4:22 PM Saverio Danker wrote: Red Word that prompted transfer to Nurse Triage:  animal bite  Chief Complaint: Cat bite Symptoms: Bleeding, pain Frequency: Within last hour Pertinent Negatives: Patient denies N/A Disposition: [] ED /[x] Urgent Care (no appt availability in office) / [] Appointment(In office/virtual)/ []  [facility]/ [] Home Care/ [] Refused Recommended Disposition /[] [facility]/ []  Follow-up with PCP Additional Notes: Patient called in to report a bite from a stray cat. Bite occurred 10 minutes prior to this call. Patient stated bite is located on her right hand, near the thumb and palm. Patient stated two puncture marks are present. Patient stated area is minimally bleeding. Patient is unsure if cat has received rabies vaccine. This RN advised patient to see provider within 4 hours, per protocol. Advised patient that given the time of day, patient would need to be seen in UC. This RN offered to help patient locate an UC. Patient became upset when I told her that I could not make an appointment at [facility] for this evening. Patient was also upset because she could not see her provider in the office at this time. Patient requested to speak with someone in the office. This RN called [facility] and spoke to [place]. This RN was advised to tell patient that all providers are with patients right now, there are no appointments left in the office today and she needs to go to UC. Patient became extremely frustrated and demanded to speak with someone in the office. This RN advised that I would route this conversation to the office and someone would call her back. Patient is requesting to speak with her PCP or Fleet Contras, a nurse in the clinic.  Reason for Disposition  [1] Puncture wound (hole through the skin) AND [2] from a cat bite (or deep claw puncture wound)  Answer Assessment - Initial Assessment Questions 1.  ANIMAL: "What type of animal caused the bite?" "Is the injury from a bite or a claw?" If the animal is a dog or a cat, ask: "Was it a pet or a stray?" "Was it acting ill or behaving strangely?"     Stray cat 2. LOCATION: "Where is the bite located?"      Thumb/palm of right hand 3. SIZE: "How big is the bite?" "What does it look like?"      Two puncture wounds from teeth 4. ONSET: "When did the bite happen?" (Minutes or hours ago)      10 minutes 5. CIRCUMSTANCES: "Tell me how this happened."      Was trying to play with cat 6. TETANUS: "When was your last tetanus booster?"     2023 7. RABIES VACCINE: For dog or cat bites, ask: "Do you know if the pet is vaccinated against rabies?"  (e.g., yes, no, overdue for rabies shot, unknown)     Unknown  Protocols used: Animal Bite-A-AH

## 2023-05-11 NOTE — Discharge Instructions (Signed)
 We have sent in an antibiotic for prophylaxis of the cat bite.  Animal control should call you in the next day or 2.  Please call them if you have not heard from them.  Additionally, please monitor for any signs of infection of the hand including significant redness and swelling, significant pain, pus drainage, fever, red streaking up the arm, or if you have any other concerns.

## 2023-05-11 NOTE — ED Provider Notes (Addendum)
 [facility]    CSN: [id number] Arrival date & time: 05/11/23  1754      History   Chief Complaint Chief Complaint  Patient presents with   Animal Bite    HPI Regina Mendez is a 63 y.o. female.   Patient is a 63 year old female who presents to the urgent care today with concerns of a cat bite to her right hand.  She reports it happened about 5 PM today.  She tried calling her primary who was unable to get her in.  She states that the cat is a neighbors wild cat that they take care of.  Patient showed a text from their neighbor indicating that the cat was up-to-date and that their rabies vaccine is valid through the end of this year.  She immediately rinsed out the wound with alcohol.  She denies any pain in that area.  She denies any significant swelling, redness, pus drainage, fever, or treatment of the arm, loss of sensation, decreased range of motion, or other concerns at this time.     Past Medical History:  Diagnosis Date   Acne    At high risk for breast cancer    33% lifetime risk, negative genetic testing [facility].   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   Routine general medical examination at a health care facility 01/14/2014   Chronic insomnia 10/26/2012   Joint pain 08/16/2011   Cervical dysplasia    Subclavian steal syndrome 08/02/2010   Allergic rhinitis 09/07/2009   Migraine 07/05/2007   LUMP OR MASS IN BREAST 07/05/2007   MENOPAUSE, EARLY 07/05/2007    Past Surgical History:  Procedure Laterality Date   APPENDECTOMY     BREAST EXCISIONAL BIOPSY Right    atypical cells    BREAST SURGERY     right breast bx   BREAST SURGERY     Right lumpectomy -dysplastic cells-2018   COLPOSCOPY     GYNECOLOGIC CRYOSURGERY  age 51   OVARIAN CYST SURGERY     TONSILLECTOMY      OB History     Gravida  2   Para  2   Term  2   Preterm      AB      Living  2      SAB      IAB      Ectopic      Multiple      Live Births               Home Medications    Prior to Admission medications   Medication Sig Start Date End Date Taking? Authorizing Provider  Ascorbic Acid (VITAMIN C) 100 MG tablet Take 100 mg by mouth daily.    [provider]  Biotin POWD Take by mouth. 02/12/16   [provider]  clindamycin (CLEOCIN-T) 1 % external solution Apply topically 2 (two) times daily. 07/13/18   Philip Aspen, Limmie Patricia, MD  clindamycin-benzoyl peroxide [facility]) gel Apply topically 2 (two) times daily. 10/18/22   Philip Aspen, Limmie Patricia, MD  Cyanocobalamin (VITAMIN B  12) 500 MCG TABS 1 tablet Orally Once a day for 30 day(s)    [provider]  EVENING PRIMROSE OIL PO Take by mouth.    [provider]  Flaxseed, Linseed, (FLAX SEED OIL PO) Take by mouth.    [provider]  ibuprofen (ADVIL) 200 MG tablet 1 tablet with food or milk as needed Orally Three times a day    [provider]  ipratropium (ATROVENT) 0.03 % nasal spray Place 2 sprays into both nostrils every 12 (twelve) hours. 01/27/20   Jarold Motto, PA  meloxicam (MOBIC) 7.5 MG tablet Take 1 tablet (7.5 mg total) by mouth daily. 05/03/22   Richardean Sale, DO  Multiple Vitamins-Minerals (HAIR/SKIN/NAILS PO) Take by mouth.    [provider]  Omega-3 Fatty Acids (FISH OIL) 1000 MG CAPS 1 capsule Orally Once a day    [provider]  OVER THE COUNTER MEDICATION Krim Oil Ginger pill Iodine in kelp Marshmallow root    [provider]  PROAIR HFA 108 (90 Base) MCG/ACT inhaler USE 2 PUFFS EVERY 6 HOURS AS NEEDED.  12/24/19   Philip Aspen, Limmie Patricia, MD  Probiotic Product (PROBIOTIC-10 PO) Take by mouth.    [provider]  QVAR REDIHALER 40 MCG/ACT inhaler INHALE 2 PUFFS INTO THE LUNGS TWICE DAILY 12/24/19   Philip Aspen, Limmie Patricia, MD  tretinoin (RETIN-A) 0.05 % cream Apply topically at bedtime. 07/13/18   Philip Aspen, Limmie Patricia, MD  triamcinolone cream (KENALOG) 0.1 % Apply 1 application topically 2 (two) times daily. 10/18/19   Henderson Cloud, MD  VITAMIN D PO Take by mouth.    [provider]    Family History Family History  Problem Relation Age of Onset   Thyroid cancer Mother    Hypertension Father    Heart disease Father    Breast cancer Cousin 21 - 79   Breast cancer Cousin 19 - 57    Social History Social History   Tobacco Use   Smoking status: Never   Smokeless tobacco: Never  Vaping Use   Vaping status: Never Used  Substance Use Topics   Alcohol use: Yes    Alcohol/week: 1.0 standard drink of alcohol    Types: 1 Standard drinks or equivalent per week   Drug use: No     Allergies   Codeine and Sulfonamide derivatives   Review of Systems Review of Systems See HPI for relevant ROS.  Physical Exam Triage Vital Signs ED Triage Vitals  Encounter Vitals Group     BP 05/11/23 1834 118/78     Systolic BP Percentile --      Diastolic BP Percentile --      Pulse Rate 05/11/23 1834 84     Resp 05/11/23 1834 20     Temp 05/11/23 1834 98.8 F (37.1 C)     Temp Source 05/11/23 1834 Oral     SpO2 05/11/23 1834 96 %     Weight --      Height --      Head Circumference --      Peak Flow --      Pain Score 05/11/23 1835 0     Pain Loc --      Pain Education --      Exclude from Growth Chart --    No data found.  Updated Vital Signs BP 118/78 (BP Location: Left Arm)   Pulse 84   Temp 98.8 F (37.1 C) (Oral)  Resp 20   LMP 08/13/2012 Comment: gyn  SpO2 96%   Visual Acuity Right Eye Distance:   Left Eye Distance:    Bilateral Distance:    Right Eye Near:   Left Eye Near:    Bilateral Near:     Physical Exam General: Alert and oriented, well-developed/well-nourished, calm, cooperative, no acute distress HEENT: Normocephalic atraumatic, moist mucous membranes, no scleral icterus, trachea midline Lungs: Speaking full sentences, non-labored respirations, no distress Heart: Regular rate and rhythm Abdomen:  Soft, nondistended Musculoskeletal: Moves all extremities well, full range of motion of the hands Pulses: 2+ radial bilaterally Neurologic: Awake, A&O x4, gait normal Integumentary: Warm, dry, normal for ethnicity, small puncture wound between the 1st and 2nd digit of the dorsal right hand Psychiatric: Appropriate mood & affect  UC Treatments / Results  Labs (all labs ordered are listed, but only abnormal results are displayed) Labs Reviewed - No data to display  EKG   Radiology No results found.  Procedures Procedures (including critical care time)  Medications Ordered in UC Medications - No data to display  Initial Impression / Assessment and Plan / UC Course  I have reviewed the triage vital signs and the nursing notes.  Pertinent labs & imaging results that were available during my care of the patient were reviewed by me and considered in my medical decision making (see chart for details).  Presents after a cat bite.  Differential Diagnosis: Cat bite, puncture wound, abscess, cellulitis, infection, rabies, including other diagnoses.  Rationale: Patient presents after a cat bite to the right hand.  No signs of infection.  Patient is well-appearing, no acute distress, afebrile, has full range of motion, and does not have pain of that area.  Patient states that the neighbor states that the cat is up-to-date on its immunizations.  Patient has filled out the animal control information which has been sent to animal control.  Prescribe patient Augmentin for prophylactic coverage of cat  bite.  Discussed that animal control should follow-up with the patient in the next day or 2 that she needs to follow-up with them if she has not heard back from them.  Discussed return precautions to the urgent care emergency department including fever, pain out of proportion, pus drainage, red streaking up the arm, significant swelling, or if she has any other concerns.  Disposition: Stable to discharge home.  All questions answered to the best of this examiner's ability. Reviewed possible severe sequelae and other reasons to return to urgent care or ED for further evaluation and/or treatment. Advised to f/u PCP w/in 48 to 72 hours for further eval and/or reassessment. Patient voices understanding of the above and agrees to plan.  An appropriate evaluation has been performed, and in my medical judgment there is currently no evidence of an immediate life-threatening or surgical condition. Discharge is therefore indicated at this time.  This document was created using the aid of voice recognition Scientist, clinical (histocompatibility and immunogenetics). Final Clinical Impressions(s) / UC Diagnoses   Final diagnoses:  None   Discharge Instructions   None    ED Prescriptions   None    PDMP not reviewed this encounter.   Gifford Shave, PA-C 05/11/23 1928    Gifford Shave, PA-C 05/11/23 1934

## 2023-05-11 NOTE — ED Triage Notes (Signed)
 Pt states scratched by a farrell cat that belongs to her neighbor. States rabies up to date and her tetanus is up to date.

## 2023-05-17 ENCOUNTER — Ambulatory Visit: Admitting: Sports Medicine

## 2023-05-17 VITALS — BP 102/60 | HR 85 | Ht 65.0 in | Wt 123.0 lb

## 2023-05-17 DIAGNOSIS — M25562 Pain in left knee: Secondary | ICD-10-CM | POA: Diagnosis not present

## 2023-05-17 MED ORDER — MELOXICAM 7.5 MG PO TABS: 7.5000 mg | ORAL_TABLET | Freq: Every day | ORAL | 0 refills | Status: DC

## 2023-05-17 NOTE — Patient Instructions (Signed)
-   Start meloxicam 15 mg daily x2 weeks.  If still having pain after 2 weeks, complete 3rd-week of NSAID. May use remaining NSAID as needed once daily for pain control.  Do not to use additional over-the-counter NSAIDs (ibuprofen, naproxen, Advil, Aleve, etc.) while taking prescription NSAIDs.  May use Tylenol [phone] mg 2 to 3 times a day for breakthrough pain.  Recommend continuing physical activity Follow up as needed if no improvement in 4 weeks

## 2023-05-17 NOTE — Progress Notes (Signed)
 Regina Mendez D.[facility] [address] [place] [address] Phone: [phone]   Assessment and Plan:     1. Acute pain of left knee -Acute, initial visit - Consistent with contusion over medial knee after shelf fell and struck patient's knee 4 to 5 days ago - Start meloxicam 7.5 mg daily x2 weeks.  If still having pain after 2 weeks, complete 3rd-week of NSAID. May use remaining NSAID as needed once daily for pain control.  Do not to use additional over-the-counter NSAIDs (ibuprofen, naproxen, Advil, Aleve, etc.) while taking prescription NSAIDs.  May use Tylenol [phone] mg 2 to 3 times a day for breakthrough pain.  -Continue physical activity as tolerated  Pertinent previous records reviewed include none  Follow Up: As needed if no improvement or worsening of symptoms.  Could x-ray left knee.  Could discuss physical therapy versus CSI   Subjective:   I, Regina Mendez, am serving as a Neurosurgeon for Regina Mendez  Chief Complaint: left knee pain   HPI:   05/17/23 Patient is a 63 year old female with left knee pain. Patient states shovel feel on knee on Sunday. Pain hasn't gotten better. Kept her up last night. Ibuprofen seems to help. More sore than anything else on medial side.   Relevant Historical Information: None pertinent  Additional pertinent review of systems negative.   Current Outpatient Medications:    Ascorbic Acid (VITAMIN C) 100 MG tablet, Take 100 mg by mouth daily., Disp: , Rfl:    Biotin POWD, Take by mouth., Disp: , Rfl:    clindamycin (CLEOCIN-T) 1 % external solution, Apply topically 2 (two) times daily., Disp: 30 mL, Rfl: 5   clindamycin-benzoyl peroxide (BENZACLIN) gel, Apply topically 2 (two) times daily., Disp: 25 g, Rfl: 2   Cyanocobalamin (VITAMIN B 12) 500 MCG TABS, 1 tablet Orally Once a day for 30 day(s), Disp: , Rfl:    EVENING PRIMROSE OIL PO, Take by mouth., Disp: , Rfl:    Flaxseed, Linseed,  (FLAX SEED OIL PO), Take by mouth., Disp: , Rfl:    ibuprofen (ADVIL) 200 MG tablet, 1 tablet with food or milk as needed Orally Three times a day, Disp: , Rfl:    ipratropium (ATROVENT) 0.03 % nasal spray, Place 2 sprays into both nostrils every 12 (twelve) hours., Disp: 30 mL, Rfl: 3   meloxicam (MOBIC) 7.5 MG tablet, Take 1 tablet (7.5 mg total) by mouth daily., Disp: 30 tablet, Rfl: 0   Multiple Vitamins-Minerals (HAIR/SKIN/NAILS PO), Take by mouth., Disp: , Rfl:    Omega-3 Fatty Acids (FISH OIL) 1000 MG CAPS, 1 capsule Orally Once a day, Disp: , Rfl:    OVER THE COUNTER MEDICATION, Krim Oil Ginger pill Iodine in kelp Marshmallow root, Disp: , Rfl:    PROAIR HFA 108 (90 Base) MCG/ACT inhaler, USE 2 PUFFS EVERY 6 HOURS AS NEEDED., Disp: 8.5 g, Rfl: 2   Probiotic Product (PROBIOTIC-10 PO), Take by mouth., Disp: , Rfl:    QVAR REDIHALER 40 MCG/ACT inhaler, INHALE 2 PUFFS INTO THE LUNGS TWICE DAILY, Disp: 8.7 g, Rfl: 2   tretinoin (RETIN-A) 0.05 % cream, Apply topically at bedtime., Disp: 45 g, Rfl: 5   triamcinolone cream (KENALOG) 0.1 %, Apply 1 application topically 2 (two) times daily., Disp: 30 g, Rfl: 1   VITAMIN D PO, Take by mouth., Disp: , Rfl:    Objective:     Vitals:   05/17/23 1450  BP: 102/60  Pulse: 85  SpO2: 98%  Weight: 123 lb (55.8 kg)  Height: 5\' 5"  (1.651 m)      Body mass index is 20.47 kg/m.    Physical Exam:    General:  awake, alert oriented, no acute distress nontoxic Skin: no suspicious lesions or rashes Neuro:sensation intact and strength 5/5 with no deficits, no atrophy, normal muscle tone Psych: No signs of anxiety, depression or other mood disorder  Left  Knee: Mild medial knee swelling with circular ecchymosis ~2 to 3 cm in diameter No deformity Neg fluid wave, joint milking ROM Flex 110, Ext 0 TTP medial knee over ecchymosis NTTP over the quad tendon, medial fem condyle, lat fem condyle, patella, plica, patella tendon, tibial tuberostiy,  fibular head, posterior fossa, pes anserine bursa, gerdy's tubercle, medial jt line, lateral jt line Neg anterior and posterior drawer Neg lachman Neg sag sign Negative varus stress Negative valgus stress Negative McMurray Negative Thessaly  Gait normal    Electronically signed by:  Marshall Skeeter D.[facility] 4:04 PM 05/17/23

## 2023-05-22 ENCOUNTER — Telehealth: Payer: Self-pay | Admitting: Sports Medicine

## 2023-05-22 NOTE — Telephone Encounter (Signed)
 Patient called stating she exercised on Friday and woke up Saturday morning with terrible left knee pain and then worked in her garden yesterday and this morning when she got up, she could barley walk.  She wanted to let Dr Cleora Daft know.

## 2023-05-22 NOTE — Telephone Encounter (Signed)
Called patient no VM set up.

## 2023-07-28 ENCOUNTER — Telehealth: Payer: Self-pay | Admitting: Internal Medicine

## 2023-07-28 ENCOUNTER — Encounter: Payer: Self-pay | Admitting: Adult Health

## 2023-07-28 ENCOUNTER — Ambulatory Visit: Admitting: Adult Health

## 2023-07-28 ENCOUNTER — Ambulatory Visit: Payer: Self-pay

## 2023-07-28 VITALS — BP 110/70 | HR 74 | Temp 98.8°F | Ht 65.0 in | Wt 128.0 lb

## 2023-07-28 DIAGNOSIS — Z20822 Contact with and (suspected) exposure to covid-19: Secondary | ICD-10-CM

## 2023-07-28 DIAGNOSIS — R5383 Other fatigue: Secondary | ICD-10-CM

## 2023-07-28 LAB — COMPREHENSIVE METABOLIC PANEL WITH GFR
ALT: 18 U/L (ref 0–35)
AST: 23 U/L (ref 0–37)
Albumin: 4.3 g/dL (ref 3.5–5.2)
Alkaline Phosphatase: 51 U/L (ref 39–117)
BUN: 12 mg/dL (ref 6–23)
CO2: 33 meq/L — ABNORMAL HIGH (ref 19–32)
Calcium: 9.8 mg/dL (ref 8.4–10.5)
Chloride: 103 meq/L (ref 96–112)
Creatinine, Ser: 0.77 mg/dL (ref 0.40–1.20)
GFR: 82.75 mL/min (ref >60.00)
Glucose, Bld: 112 mg/dL — ABNORMAL HIGH (ref 70–99)
Potassium: 4.3 meq/L (ref 3.5–5.1)
Sodium: 140 meq/L (ref 135–145)
Total Bilirubin: 0.4 mg/dL (ref 0.2–1.2)
Total Protein: 7.6 g/dL (ref 6.0–8.3)

## 2023-07-28 LAB — TSH: TSH: 0.65 u[IU]/mL (ref 0.35–5.50)

## 2023-07-28 LAB — VITAMIN B12: Vitamin B-12: >1500 pg/mL — ABNORMAL HIGH (ref 211–911)

## 2023-07-28 LAB — CBC
HCT: 40.4 % (ref 36.0–46.0)
Hemoglobin: 13.5 g/dL (ref 12.0–15.0)
MCHC: 33.3 g/dL (ref 30.0–36.0)
MCV: 94.4 fl (ref 78.0–100.0)
Platelets: 219 10*3/uL (ref 150.0–400.0)
RBC: 4.29 Mil/uL (ref 3.87–5.11)
RDW: 13.1 % (ref 11.5–15.5)
WBC: 5.4 10*3/uL (ref 4.0–10.5)

## 2023-07-28 LAB — POC COVID19 BINAXNOW: SARS Coronavirus 2 Ag: NEGATIVE

## 2023-07-28 LAB — VITAMIN D 25 HYDROXY (VIT D DEFICIENCY, FRACTURES): VITD: 55.67 ng/mL (ref 30.00–100.00)

## 2023-07-28 NOTE — Patient Instructions (Signed)
 It was great meeting you today   We will follow up with you regarding your lab work   Try increasing calories by 200/day and try going to the gym every other day

## 2023-07-28 NOTE — Telephone Encounter (Signed)
 Pt was a patient of Dr. Krystal before he retired.  She is now a Dr. Theophilus pt, however she is wanting to switch to Dr. Ozell.  [address] is one of Dr. Heddie patients and she has recommended her to Dr. Ozell.  Please let me know if she can switch and I will call the patient back.

## 2023-07-28 NOTE — Telephone Encounter (Signed)
 FYI Only or Action Required?: FYI only for provider.  Patient was last seen in primary care on 10/18/2022 by Theophilus Andrews, Tully GRADE, MD. Called Nurse Triage reporting Extremity Weakness. Symptoms began 3 to 4 days. Interventions attempted: Rest, hydration, or home remedies. Symptoms are: gradually worsening.  Triage Disposition: See Physician Within 24 Hours  Patient/caregiver understands and will follow disposition?: Yes    Copied from CRM [id number]. Topic: Clinical - Red Word Triage >> Jul 28, 2023  8:22 AM Suzen RAMAN wrote: Red Word that prompted transfer to Nurse Triage: Extreme Fatigue   ----------------------------------------------------------------------- From previous Reason for Contact - Scheduling: Patient/patient representative is calling to schedule an appointment. Refer to attachments for appointment information. Reason for Disposition  [1] MODERATE weakness (i.e., interferes with work, school, normal activities) AND [2] persists > 3 days  Answer Assessment - Initial Assessment Questions 1. DESCRIPTION: Describe how you are feeling.     Tired, weakness 2. SEVERITY: How bad is it?  Can you stand and walk?   - MILD (0-3): Feels weak or tired, but does not interfere with work, school or normal activities.   - MODERATE (4-7): Able to stand and walk; weakness interferes with work, school, or normal activities.   - SEVERE (8-10): Unable to stand or walk; unable to do usual activities.     moderate 3. ONSET: When did these symptoms begin? (e.g., hours, days, weeks, months)     X 3 to 4 weeks 4. CAUSE: What do you think is causing the weakness or fatigue? (e.g., not drinking enough fluids, medical problem, trouble sleeping)     unknown 5. NEW MEDICINES:  Have you started on any new medicines recently? (e.g., opioid pain medicines, benzodiazepines, muscle relaxants, antidepressants, antihistamines, neuroleptics, beta blockers)     no 6. OTHER SYMPTOMS: Do you  have any other symptoms? (e.g., chest pain, fever, cough, SOB, vomiting, diarrhea, bleeding, other areas of pain)     no 7. PREGNANCY: Is there any chance you are pregnant? When was your last menstrual period?     N/a  Protocols used: Weakness (Generalized) and Fatigue-A-AH

## 2023-07-28 NOTE — Telephone Encounter (Signed)
 Patient was scheduled for an appt today with [facility] at 2pm.

## 2023-07-28 NOTE — Progress Notes (Signed)
 Subjective:    Patient ID: Regina Mendez, female    DOB: 1960-06-01, 63 y.o.   MRN: [id number]  HPI 63 year old female who  has a past medical history of Acne, At high risk for breast cancer, Breast cyst, COVID (05/2020), Dry mouth and eyes, and Migraine headache.  She is a patient of Dr Theophilus who I am seeing today for fatigue that she has been experiencing for 4-5 weeks. She reports that she eats healthy ( 1400-1500 calories aday ) and goes to the gym 4-5 times a week. When she gets home from the gym she feels exhausted afterwards and cannot do things she wants to do. She feels like the fatigue is getting worse over the last week. At times she does feel as though she wakes up feeling fatigued. She does not know if she snores. Denies apnea.   Her daughter was exposed to Covid 19 but never tested for it. She would like to test for covid.   Wt Readings from Last 3 Encounters:  07/28/23 128 lb (58.1 kg)  05/17/23 123 lb (55.8 kg)  11/17/22 119 lb (54 kg)   Review of Systems See HPI   Past Medical History:  Diagnosis Date   Acne    At high risk for breast cancer    33% lifetime risk, negative genetic testing [facility].   Breast cyst    COVID 05/2020   Dry mouth and eyes    Migraine headache     Social History   Socioeconomic History   Marital status: Married    Spouse name: Not on file   Number of children: Not on file   Years of education: Not on file   Highest education level: Not on file  Occupational History   Not on file  Tobacco Use   Smoking status: Never   Smokeless tobacco: Never  Vaping Use   Vaping status: Never Used  Substance and Sexual Activity   Alcohol use: Yes    Alcohol/week: 1.0 standard drink of alcohol    Types: 1 Standard drinks or equivalent per week   Drug use: No   Sexual activity: Not Currently    Birth control/protection: Post-menopausal    Comment: 1st intercourse 63 yo-More than 5 partners  Other Topics Concern   Not on file  Social  History Narrative   Not on file   Social Drivers of Health   Financial Resource Strain: Not on file  Food Insecurity: No Food Insecurity (07/15/2021)   Received from [facility]   Hunger Vital Sign    Within the past 12 months, you worried that your food would run out before you got the money to buy more.: Never true    Within the past 12 months, the food you bought just didn't last and you didn't have money to get more.: Never true  Transportation Needs: Not on file  Physical Activity: Not on file  Stress: Not on file  Social Connections: Unknown (07/15/2021)   Received from [facility]   Social Network    Social Network: Not on file  Intimate Partner Violence: Unknown (07/15/2021)   Received from [facility]   HITS    Physically Hurt: Not on file    Insult or Talk Down To: Not on file    Threaten Physical Harm: Not on file    Scream or Curse: Not on file    Past Surgical History:  Procedure Laterality Date   APPENDECTOMY     BREAST EXCISIONAL  BIOPSY Right    atypical cells   BREAST SURGERY     right breast bx   BREAST SURGERY     Right lumpectomy -dysplastic cells-2018   COLPOSCOPY     GYNECOLOGIC CRYOSURGERY  age 64   OVARIAN CYST SURGERY     TONSILLECTOMY      Family History  Problem Relation Age of Onset   Thyroid  cancer Mother    Hypertension Father    Heart disease Father    Breast cancer Cousin 28 - 20   Breast cancer Cousin 30 - 39    Allergies  Allergen Reactions   Codeine Other (See Comments)    Drowsy   Sulfonamide Derivatives Dermatitis    Current Outpatient Medications on File Prior to Visit  Medication Sig Dispense Refill   Ascorbic Acid (VITAMIN C) 100 MG tablet Take 100 mg by mouth daily.     Biotin POWD Take by mouth.     clindamycin  (CLEOCIN -T) 1 % external solution Apply topically 2 (two) times daily. 30 mL 5   clindamycin -benzoyl peroxide (BENZACLIN) gel Apply topically 2 (two) times daily. 25 g 2   Cyanocobalamin  (VITAMIN B 12)  500 MCG TABS 1 tablet Orally Once a day for 30 day(s)     EVENING PRIMROSE OIL PO Take by mouth.     Flaxseed, Linseed, (FLAX SEED OIL PO) Take by mouth.     ibuprofen (ADVIL) 200 MG tablet 1 tablet with food or milk as needed Orally Three times a day     ipratropium (ATROVENT ) 0.03 % nasal spray Place 2 sprays into both nostrils every 12 (twelve) hours. 30 mL 3   meloxicam  (MOBIC ) 7.5 MG tablet Take 1 tablet (7.5 mg total) by mouth daily. 30 tablet 0   Multiple Vitamins-Minerals (HAIR/SKIN/NAILS PO) Take by mouth.     Omega-3 Fatty Acids (FISH OIL) 1000 MG CAPS 1 capsule Orally Once a day     OVER THE COUNTER MEDICATION Krim Oil Ginger pill Iodine in kelp Marshmallow root     PROAIR  HFA 108 (90 Base) MCG/ACT inhaler USE 2 PUFFS EVERY 6 HOURS AS NEEDED. 8.5 g 2   Probiotic Product (PROBIOTIC-10 PO) Take by mouth.     QVAR  REDIHALER 40 MCG/ACT inhaler INHALE 2 PUFFS INTO THE LUNGS TWICE DAILY 8.7 g 2   tretinoin  (RETIN-A ) 0.05 % cream Apply topically at bedtime. 45 g 5   triamcinolone  cream (KENALOG ) 0.1 % Apply 1 application topically 2 (two) times daily. 30 g 1   VITAMIN D  PO Take by mouth.     No current facility-administered medications on file prior to visit.    BP 110/70   Pulse 74   Temp 98.8 F (37.1 C) (Oral)   Ht 5' 5 (1.651 m)   Wt 128 lb (58.1 kg)   LMP 08/13/2012 Comment: gyn  SpO2 97%   BMI 21.30 kg/m       Objective:   Physical Exam Vitals and nursing note reviewed.  Constitutional:      Appearance: Normal appearance.   Cardiovascular:     Rate and Rhythm: Normal rate and regular rhythm.     Pulses: Normal pulses.     Heart sounds: Normal heart sounds.  Pulmonary:     Effort: Pulmonary effort is normal.     Breath sounds: Normal breath sounds.  Abdominal:     General: Abdomen is flat.     Palpations: Abdomen is soft.   Musculoskeletal:        General:  Normal range of motion.   Skin:    General: Skin is warm and dry.   Neurological:      General: No focal deficit present.     Mental Status: She is alert and oriented to person, place, and time.   Psychiatric:        Mood and Affect: Mood normal.        Behavior: Behavior normal.        Thought Content: Thought content normal.        Judgment: Judgment normal.        Assessment & Plan:  1. Other fatigue (Primary) - Encouraged to add 200 calories a day and skip days in between her workouts. Cannot completely r/o sleep apnea but will check lab work first  - CBC; Future - Comprehensive metabolic panel with GFR; Future - VITAMIN D  25 Hydroxy (Vit-D Deficiency, Fractures); Future - Vitamin B12; Future - TSH; Future  2. Close exposure to COVID-19 virus  - POC COVID-19  Darleene Shape, NP  Time spent with patient today was 32 minutes which consisted of chart review, discussing fatigue and  work up, treatment answering questions and documentation.

## 2023-07-30 ENCOUNTER — Ambulatory Visit: Payer: Self-pay | Admitting: Adult Health

## 2023-07-31 ENCOUNTER — Encounter: Payer: Self-pay | Admitting: Family Medicine

## 2023-07-31 NOTE — Telephone Encounter (Signed)
 Ok with me

## 2023-07-31 NOTE — Telephone Encounter (Signed)
 Called/LVM--Patient has been approved to have a TOC visit with Dr. Ozell.

## 2023-08-08 ENCOUNTER — Ambulatory Visit (INDEPENDENT_AMBULATORY_CARE_PROVIDER_SITE_OTHER): Admitting: Family Medicine

## 2023-08-08 ENCOUNTER — Encounter: Payer: Self-pay | Admitting: Family Medicine

## 2023-08-08 VITALS — BP 120/62 | HR 62 | Temp 98.6°F | Ht 65.0 in | Wt 122.8 lb

## 2023-08-08 DIAGNOSIS — R739 Hyperglycemia, unspecified: Secondary | ICD-10-CM | POA: Diagnosis not present

## 2023-08-08 DIAGNOSIS — K529 Noninfective gastroenteritis and colitis, unspecified: Secondary | ICD-10-CM

## 2023-08-08 DIAGNOSIS — F419 Anxiety disorder, unspecified: Secondary | ICD-10-CM

## 2023-08-08 LAB — POCT GLYCOSYLATED HEMOGLOBIN (HGB A1C): Hemoglobin A1C: 5.6 % (ref 4.0–5.6)

## 2023-08-08 NOTE — Progress Notes (Unsigned)
 Established Patient Office Visit  Subjective   Patient ID: Regina Mendez, female    DOB: [birth year]  Age: 63 y.o. MRN: [id number]  Chief Complaint  Patient presents with  . Establish Care    TOC from Dr Theophilus    Pt is here for transition of care visit.   Pt reports she has also had a change in bowel habits since she had a colonoscopy. States that she is having frequent BM's, states that she often cannot feel when it comes out -- states she is eating a lot of fruits and veggies, peanut butter, yogurt, eats fish, varied sources of protein, 5 days a week of working out. Still having a lot of fatigue, in the morning before she drinks her coffee and also in the afternoons -- doesn't feel like she has a lot of energy to do her tasks in the evening. Pt states that she is getting around 7-8 hours, no trouble with falling asleep, no nighttime awakenings.  Pt reports she feels a lot of stress due to the political climate right now. States that with the deportations and detention centers it makes her very scared and sad. States that states she used to have a therapist that she used to see but hasn't been in a while. She also reports that she is having some memory issues as well. States that she forgets details a lot, has to write things down a lot.     Current Outpatient Medications  Medication Instructions  . Biotin POWD Take by mouth.  . clindamycin  (CLEOCIN -T) 1 % external solution Topical, 2 times daily  . clindamycin -benzoyl peroxide (BENZACLIN) gel Topical, 2 times daily  . Cyanocobalamin  (VITAMIN B 12) 500 MCG TABS 1 tablet Orally Once a day for 30 day(s)  . EVENING PRIMROSE OIL PO Take by mouth.  . Flaxseed, Linseed, (FLAX SEED OIL PO) Take by mouth.  SABRA ibuprofen (ADVIL) 200 MG tablet 1 tablet with food or milk as needed Orally Three times a day  . meloxicam  (MOBIC ) 7.5 mg, Oral, Daily  . Multiple Vitamins-Minerals (HAIR/SKIN/NAILS PO) Take by mouth.  . Omega-3 Fatty Acids (FISH  OIL) 1000 MG CAPS 1 capsule Orally Once a day  . OVER THE COUNTER MEDICATION Krim Oil Ginger pill Iodine in kelp Marshmallow root  . Probiotic Product (PROBIOTIC-10 PO) Take by mouth.  . tretinoin  (RETIN-A ) 0.05 % cream Topical, Daily at bedtime  . triamcinolone  cream (KENALOG ) 0.1 % 1 application , Topical, 2 times daily  . vitamin C 100 mg, Daily  . VITAMIN D  PO Take by mouth.    Patient Active Problem List   Diagnosis Date Noted  . Need for Tdap vaccination 06/18/2021  . Contusion of left hand 06/18/2021  . IGT (impaired glucose tolerance) 07/15/2020  . Hyperlipidemia 07/15/2020  . Vitamin D  deficiency 07/17/2018  . History of colon polyps 10/12/2016  . 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 Routine general medical examination at a health care facility 01/14/2014  . Chronic insomnia 10/26/2012  . Joint pain 08/16/2011  . Cervical dysplasia   . 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 Allergic rhinitis 09/07/2009  . Migraine 07/05/2007  . LUMP OR MASS IN BREAST 07/05/2007  . MENOPAUSE, EARLY 07/05/2007      Review of Systems  All other systems reviewed and are negative.     Objective:     BP 120/62   Pulse 62   Temp 98.6 F (37 C) (Oral)   Ht 5' 5 (1.651 m)  Wt 122 lb 12.8 oz (55.7 kg)   LMP 08/13/2012 Comment: gyn  SpO2 98%   BMI 20.43 kg/m     Physical Exam Vitals reviewed.  Constitutional:      Appearance: Normal appearance. She is well-groomed and normal weight.  Eyes:     Conjunctiva/sclera: Conjunctivae normal.  Neck:     Thyroid : No thyromegaly.  Cardiovascular:     Rate and Rhythm: Normal rate and regular rhythm.     Pulses: Normal pulses.     Heart sounds: S1 normal and S2 normal.  Pulmonary:     Effort: Pulmonary effort is normal.     Breath sounds: Normal breath sounds and air entry.  Abdominal:     General: Bowel sounds are normal.  Musculoskeletal:     Right lower leg: No edema.     Left lower leg: No  edema.  Neurological:     Mental Status: She is alert and oriented to person, place, and time. Mental status is at baseline.     Gait: Gait is intact.  Psychiatric:        Mood and Affect: Mood and affect normal.        Speech: Speech normal.        Behavior: Behavior normal.        Judgment: Judgment normal.      Results for orders placed or performed in visit on 08/08/23  POC HgB A1c  Result Value Ref Range   Hemoglobin A1C 5.6 4.0 - 5.6 %   HbA1c POC (<> result, manual entry)     HbA1c, POC (prediabetic range)     HbA1c, POC (controlled diabetic range)         The 10-year ASCVD risk score (Arnett DK, et al., 2019) is: 3.4%    Assessment & Plan:  Hyperglycemia -     POCT glycosylated hemoglobin (Hb A1C)  Chronic diarrhea -     Ambulatory referral to Gastroenterology  Anxiety -     Ambulatory referral to Psychology     Return in about 3 months (around 11/08/2023) for annual physical exam.    Heron CHRISTELLA Sharper, MD

## 2023-08-09 DIAGNOSIS — K529 Noninfective gastroenteritis and colitis, unspecified: Secondary | ICD-10-CM | POA: Insufficient documentation

## 2023-08-09 NOTE — Assessment & Plan Note (Signed)
 Pt states she has been told she had IBS in the past, has not seen a GI doctor recently, will send to GI for further recommendations and testing.

## 2023-08-31 ENCOUNTER — Ambulatory Visit: Admitting: Psychology

## 2023-09-20 ENCOUNTER — Ambulatory Visit: Admitting: Psychology

## 2023-09-20 DIAGNOSIS — F411 Generalized anxiety disorder: Secondary | ICD-10-CM | POA: Diagnosis not present

## 2023-09-20 NOTE — Progress Notes (Addendum)
 [facility] Counselor Initial Adult Exam  Name: Regina Mendez Date: 09/20/2023 MRN: [id number] DOB: [birth year] PCP: Ozell Heron HERO, MD  Time spent: 60 minutes  Guardian/Payee:  self    Paperwork requested: No   Reason for Visit /Presenting Problem: worry, anxiety  Mental Status Exam: Appearance:   Casual     Behavior:  Appropriate  Motor:  Normal  Speech/Language:   Pressured  Affect:  Appropriate  Mood:  anxious  Thought process:  normal  Thought content:    WNL  Sensory/Perceptual disturbances:    WNL  Orientation:  oriented to person, place, and situation  Attention:  Good  Concentration:  Good  Memory:  WNL  Fund of knowledge:   Good  Insight:    Good  Judgment:   Good  Impulse Control:  Good    Reported Symptoms:  chronic worry, anxiety, agitation  Risk Assessment: Danger to Self:  No Self-injurious Behavior: No Danger to Others: No Duty to Warn:no Physical Aggression / Violence:No  Access to Firearms a concern: No  Gang Involvement:No  Patient / guardian was educated about steps to take if suicide or homicide risk level increases between visits: n/a While future psychiatric events cannot be accurately predicted, the patient does not currently require acute inpatient psychiatric care and does not currently meet [facility]  involuntary commitment criteria.  Substance Abuse History: Current substance abuse: No     Past Psychiatric History:   Previous psychological history is significant for anxiety Outpatient Providers:Shabana Armentrout, Ph.D. History of Psych Hospitalization: No  Psychological Testing: N/A   Abuse History:  Victim of: No., N/A   Report needed: No. Victim of Neglect:No. Perpetrator of N/A  Witness / Exposure to Domestic Violence: No   Protective Services Involvement: No  Witness to [employer] Violence:  No   Family History:  Family History  Problem Relation Age of Onset   Thyroid   cancer Mother    Hypertension Father    Heart disease Father    Breast cancer Cousin 30 - 5   Breast cancer Cousin 30 - 54    Living situation: the patient lives with their spouse  Sexual Orientation: Straight  Relationship Status: married  Name of spouse / other:unknown If a parent, number of children / ages:2  Support Systems: friends  Surveyor, quantity Stress:  No   Income/Employment/Disability: Employment  Financial planner: No   Educational History: Education: post Engineer, maintenance (IT) work or Engineer, technical sales: unknown  Any cultural differences that may affect / interfere with treatment:  not applicable   Recreation/Hobbies: reading  Stressors: Occupational concerns    Strengths: Supportive Relationships, Family, and Friends  Barriers:  unknown at this time   Legal History: Pending legal issue / charges: The patient has no significant history of legal issues. History of legal issue / charges: unknown  Medical History/Surgical History: not reviewed Past Medical History:  Diagnosis Date   Acne    At high risk for breast cancer    33% lifetime risk, negative genetic testing [facility].   Breast cyst    COVID 05/2020   Dry mouth and eyes    Migraine headache     Past Surgical History:  Procedure Laterality Date   APPENDECTOMY     BREAST EXCISIONAL BIOPSY Right    atypical cells   BREAST SURGERY     right breast bx   BREAST SURGERY  Right lumpectomy -dysplastic cells-2018   COLPOSCOPY     GYNECOLOGIC CRYOSURGERY  age 71   OVARIAN CYST SURGERY     TONSILLECTOMY      Medications: Current Outpatient Medications  Medication Sig Dispense Refill   Ascorbic Acid (VITAMIN C) 100 MG tablet Take 100 mg by mouth daily.     Biotin POWD Take by mouth.     clindamycin  (CLEOCIN -T) 1 % external solution Apply topically 2 (two) times daily. 30 mL 5   clindamycin -benzoyl peroxide (BENZACLIN) gel Apply topically 2 (two) times daily. 25 g 2    Cyanocobalamin  (VITAMIN B 12) 500 MCG TABS 1 tablet Orally Once a day for 30 day(s)     EVENING PRIMROSE OIL PO Take by mouth.     Flaxseed, Linseed, (FLAX SEED OIL PO) Take by mouth.     ibuprofen (ADVIL) 200 MG tablet 1 tablet with food or milk as needed Orally Three times a day     meloxicam  (MOBIC ) 7.5 MG tablet Take 1 tablet (7.5 mg total) by mouth daily. 30 tablet 0   Multiple Vitamins-Minerals (HAIR/SKIN/NAILS PO) Take by mouth.     Omega-3 Fatty Acids (FISH OIL) 1000 MG CAPS 1 capsule Orally Once a day     OVER THE COUNTER MEDICATION Krim Oil Ginger pill Iodine in kelp Marshmallow root     Probiotic Product (PROBIOTIC-10 PO) Take by mouth.     tretinoin  (RETIN-A ) 0.05 % cream Apply topically at bedtime. 45 g 5   triamcinolone  cream (KENALOG ) 0.1 % Apply 1 application topically 2 (two) times daily. 30 g 1   VITAMIN D  PO Take by mouth.     No current facility-administered medications for this visit.    Allergies  Allergen Reactions   Codeine Other (See Comments)    Drowsy   Sulfonamide Derivatives Dermatitis   Initial Session: Patient returning after 5 years. She says her daughters are seniors at [employer]. She says her daughters are both very anxious but doing well. She states that she has considerable anxiety herself. She discussed her culture at work, which is very complicated. The current political climate, at both work and in the world, is creating a lot of her anxiety. Her happiest is when she is painting. She says her marriage is strained and she feels that her husband is on the spectrum. She says he does not touch her and there is no intimacy.  She is seeking to establish better relationship with family members. Is also wanting strategies to manage her anxiety with the world circumstances.   Diagnoses:  Generalized Anxiety  Plan of Care: outpatient psychotherapy  Goals/Plan: Patient will engage in individual psychotherapy to reduce symptoms associated with generalized  anxiety. Will utilize insight oriented treatment and behavioral strategies to facilitate patient's understanding of her behaviors and reactions, and to assist her ability to manage symptoms. Target date is 8-26.

## 2023-10-20 ENCOUNTER — Ambulatory Visit: Admitting: Family Medicine

## 2023-10-20 ENCOUNTER — Encounter: Payer: Self-pay | Admitting: Family Medicine

## 2023-10-20 VITALS — BP 110/70 | HR 90 | Temp 98.2°F | Ht 65.5 in | Wt 121.9 lb

## 2023-10-20 DIAGNOSIS — M797 Fibromyalgia: Secondary | ICD-10-CM

## 2023-10-20 DIAGNOSIS — Z23 Encounter for immunization: Secondary | ICD-10-CM | POA: Diagnosis not present

## 2023-10-20 DIAGNOSIS — K529 Noninfective gastroenteritis and colitis, unspecified: Secondary | ICD-10-CM

## 2023-10-20 DIAGNOSIS — Z8349 Family history of other endocrine, nutritional and metabolic diseases: Secondary | ICD-10-CM | POA: Diagnosis not present

## 2023-10-20 DIAGNOSIS — E782 Mixed hyperlipidemia: Secondary | ICD-10-CM

## 2023-10-20 DIAGNOSIS — R739 Hyperglycemia, unspecified: Secondary | ICD-10-CM | POA: Diagnosis not present

## 2023-10-20 DIAGNOSIS — M2550 Pain in unspecified joint: Secondary | ICD-10-CM

## 2023-10-20 DIAGNOSIS — G8929 Other chronic pain: Secondary | ICD-10-CM

## 2023-10-20 LAB — LIPID PANEL
Cholesterol: 216 mg/dL — ABNORMAL HIGH (ref 0–200)
HDL: 52.7 mg/dL (ref >39.00)
LDL Cholesterol: 139 mg/dL — ABNORMAL HIGH (ref 0–99)
NonHDL: 163.15
Total CHOL/HDL Ratio: 4
Triglycerides: 120 mg/dL (ref 0.0–149.0)
VLDL: 24 mg/dL (ref 0.0–40.0)

## 2023-10-20 LAB — HEMOGLOBIN A1C: Hgb A1c MFr Bld: 6 % (ref 4.6–6.5)

## 2023-10-20 LAB — TSH: TSH: 1.32 u[IU]/mL (ref 0.35–5.50)

## 2023-10-20 NOTE — Progress Notes (Signed)
 Complete physical exam  Patient: Regina Mendez   DOB: Apr 08, 1960   63 y.o. Female  MRN: [id number]  Subjective:    Chief Complaint  Patient presents with   Annual Exam    Regina Mendez is a 63 y.o. female who presents today for a complete physical exam. She reports consuming a general diet. Gym/ health club routine includes cardio, light weights, swimming, and daily at the gym. She generally feels chronically fatigued. She reports sleeping fairly well. She does not have additional problems to discuss today.    Most recent fall risk assessment:    10/18/2022   10:41 AM  Fall Risk   Falls in the past year? 0  Number falls in past yr: 0  Injury with Fall? 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   Routine general medical examination at a health care facility 01/14/2014   Chronic insomnia 10/26/2012   Joint pain 08/16/2011   Cervical dysplasia    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   Biotin POWD Take by mouth.   clindamycin  (CLEOCIN -T) 1 % external solution Apply topically 2 (two) times daily.   clindamycin -benzoyl peroxide (BENZACLIN) gel Apply topically 2 (two) times daily.   Cyanocobalamin  (VITAMIN B 12) 500 MCG TABS 1 tablet Orally Once a day for 30 day(s)   EVENING PRIMROSE OIL PO Take by mouth.   Flaxseed, Linseed, (FLAX SEED OIL PO) Take by mouth.   ibuprofen (ADVIL) 200 MG tablet 1 tablet with food or milk as needed Orally Three times a day   meloxicam  (MOBIC ) 7.5 MG tablet Take 1 tablet (7.5 mg total) by mouth daily.   Multiple Vitamins-Minerals (HAIR/SKIN/NAILS PO) Take by mouth.   Omega-3 Fatty Acids (FISH OIL) 1000 MG CAPS 1 capsule Orally Once a day   OVER THE COUNTER MEDICATION Krim Oil Ginger pill Iodine in kelp Marshmallow root   Probiotic Product (PROBIOTIC-10 PO) Take by mouth.   tretinoin  (RETIN-A ) 0.05 % cream Apply topically at bedtime.   triamcinolone  cream (KENALOG ) 0.1 % Apply 1 application topically 2 (two) times daily.   VITAMIN D  PO Take by mouth.   No facility-administered medications prior to visit.    Review of Systems  HENT:  Negative for hearing loss.   Eyes:  Negative for blurred vision.  Respiratory:  Negative for shortness of breath.   Cardiovascular:  Negative for chest pain.  Gastrointestinal: Negative.   Genitourinary: Negative.   Musculoskeletal:  Negative for back pain.  Neurological:  Negative for headaches.  Psychiatric/Behavioral:  Negative for depression.        Objective:     BP 110/70   Pulse 90   Temp 98.2 F (36.8 C) (Oral)   Ht 5' 5.5 (1.664 m)   Wt 121 lb 14.4 oz (55.3 kg)   LMP 08/13/2012 Comment: gyn  SpO2 98%   BMI 19.98 kg/m    Physical Exam Vitals reviewed.  Constitutional:      Appearance: Normal appearance. She is well-groomed and normal weight.  HENT:     Right Ear: Tympanic membrane and ear canal normal.     Left Ear: Tympanic membrane and ear canal normal.      Mouth/Throat:     Mouth: Mucous membranes are moist.     Pharynx: No posterior oropharyngeal erythema.  Eyes:     Conjunctiva/sclera: Conjunctivae normal.  Neck:     Thyroid : No thyromegaly.  Cardiovascular:     Rate and Rhythm: Normal rate and regular rhythm.     Pulses: Normal pulses.     Heart sounds: S1 normal and S2 normal.  Pulmonary:     Effort: Pulmonary effort is normal.     Breath sounds: Normal breath sounds and air entry.  Abdominal:     General: Abdomen is flat. Bowel sounds are normal.     Palpations: Abdomen is soft.  Musculoskeletal:     Right lower leg: No edema.     Left lower leg: No edema.  Lymphadenopathy:     Cervical: No cervical adenopathy.  Neurological:     Mental Status: She is alert and oriented to person, place, and time. Mental status is at baseline.     Gait: Gait is intact.  Psychiatric:        Mood and Affect: Mood and affect normal.        Speech: Speech normal.        Behavior: Behavior normal.        Judgment: Judgment normal.        Results for orders placed or performed in visit on 10/20/23  Hemoglobin A1c  Result Value Ref Range   Hgb A1c MFr Bld 6.0 4.6 - 6.5 %  TSH  Result Value Ref Range   TSH 1.32 0.35 - 5.50 uIU/mL  Lipid panel  Result Value Ref Range   Cholesterol 216 (H) 0 - 200 mg/dL   Triglycerides 879.9 0.0 - 149.0 mg/dL   HDL 47.29 >60.99 mg/dL   VLDL 75.9 0.0 - 59.9 mg/dL   LDL Cholesterol 860 (H) 0 - 99 mg/dL   Total CHOL/HDL Ratio 4    NonHDL 163.15        Assessment & Plan:    Routine Health Maintenance and Physical Exam  Immunization History  Administered Date(s) Administered    sv, Bivalent, Protein Subunit Rsvpref,pf [place]) 12/17/2021   Influenza Split 10/06/2011   Influenza Whole 11/06/2007, 12/17/2008   Influenza, Seasonal, Injecte, Preservative Fre 10/18/2022, 10/20/2023   Influenza,inj,Quad PF,6+ Mos 10/26/2012, 09/18/2013, 02/09/2015, 11/02/2015, 02/20/2017, 12/18/2017, 09/28/2021    PFIZER(Purple Top)SARS-COV-2 Vaccination 04/04/2019, 04/30/2019, 11/21/2019   Pfizer Covid-19 Vaccine Bivalent Booster 77yrs & up 11/20/2020   Td 02/01/2003   Tdap 01/14/2014, 06/18/2021   Zoster Recombinant(Shingrix ) 07/13/2018, 07/15/2020    Health Maintenance  Topic Date Due   Pneumococcal Vaccine: 50+ Years (1 of 1 - PCV) Never done   COVID-19 Vaccine (5 - 2025-26  season) 10/02/2023   Mammogram  01/26/2025   Colonoscopy  05/05/2027   Cervical Cancer Screening (HPV/Pap Cotest)  11/01/2027   DTaP/Tdap/Td (4 - Td or Tdap) 06/19/2031   Influenza Vaccine  Completed   Hepatitis C Screening  Completed   HIV Screening  Completed   Zoster Vaccines- Shingrix   Completed   Hepatitis B Vaccines 19-59 Average Risk  Aged Out   HPV VACCINES  Aged Out   Meningococcal B Vaccine  Aged Out    Discussed health benefits of physical activity, and encouraged her to engage in regular exercise appropriate for her age and condition.  Immunization due -     Flu vaccine trivalent PF, 6mos and older(Flulaval,Afluria,Fluarix,Fluzone)  Mixed hyperlipidemia -     Lipid panel; Future  Family history of thyroid  disease -     TSH; Future  Hyperglycemia -     Hemoglobin A1c; Future  Chronic diarrhea -     Ambulatory referral to Gastroenterology  Chronic pain of multiple joints -     Ambulatory referral to Physical Medicine Rehab  Fibromyalgia -     Ambulatory referral to Physical Medicine Rehab  General physical exam findings are normal today. I reviewed the patient's preventative testing, immunizations, and lifestyle habits. I made appropriate recommendations and placed orders for the appropriate tests and/or vaccinations. I counseled the patient on the CDC's recommendations for healthy exercise and diet. I counseled the patient on healthy sleep habits and stress management. Handouts to reinforce the counseling were given at the conclusion of the visit.    Return in about 1 year (around 10/19/2024)  for annual physical exam.     Heron CHRISTELLA Sharper, MD

## 2023-10-25 ENCOUNTER — Encounter: Payer: Self-pay | Admitting: Family Medicine

## 2023-10-27 ENCOUNTER — Ambulatory Visit: Payer: Self-pay | Admitting: Family Medicine

## 2023-10-27 DIAGNOSIS — R5382 Chronic fatigue, unspecified: Secondary | ICD-10-CM

## 2023-10-27 DIAGNOSIS — K529 Noninfective gastroenteritis and colitis, unspecified: Secondary | ICD-10-CM

## 2023-10-27 DIAGNOSIS — R400 Somnolence: Secondary | ICD-10-CM

## 2023-11-06 ENCOUNTER — Encounter: Admitting: Internal Medicine

## 2023-11-13 ENCOUNTER — Ambulatory Visit: Payer: Self-pay

## 2023-11-13 ENCOUNTER — Telehealth: Payer: Self-pay | Admitting: Family Medicine

## 2023-11-13 NOTE — Telephone Encounter (Signed)
 Appt on 10/14 at 2:20pm.

## 2023-11-13 NOTE — Telephone Encounter (Unsigned)
 Copied from CRM [id number]. Topic: Clinical - Refused Triage >> Nov 13, 2023 10:04 AM Donna BRAVO wrote: Patient/caller voiced complaints of  patient has pain all over body, muscle and joint pain, unable to function.   Declined transfer to triage.  Patient refused to speak with Nurse triage and wants to schedule appt only.

## 2023-11-13 NOTE — Telephone Encounter (Signed)
 Refused nurse triage. Chart review reveals office visit appointment on 11/14/23   Copied from CRM #[id number]. Topic: Clinical - Red Word Triage >> Nov 13, 2023  9:59 AM Donna BRAVO wrote: Red Word that prompted transfer to Nurse Triage: patient has pain all over body, muscle and joint pain, unable to function Reason for Disposition  Caller requesting an appointment, triage offered and declined  Answer Assessment - Initial Assessment Questions 1. REASON FOR CALL or QUESTION: What is your reason for calling today? or How can I best    Refused nurse triage  Protocols used: PCP Call - No Triage-A-AH

## 2023-11-13 NOTE — Telephone Encounter (Signed)
 Ok to close

## 2023-11-14 ENCOUNTER — Ambulatory Visit: Admitting: Family Medicine

## 2023-11-14 ENCOUNTER — Encounter: Payer: Self-pay | Admitting: Family Medicine

## 2023-11-14 VITALS — BP 124/80 | HR 84 | Temp 98.7°F | Ht 65.5 in | Wt 120.9 lb

## 2023-11-14 DIAGNOSIS — R5383 Other fatigue: Secondary | ICD-10-CM

## 2023-11-14 DIAGNOSIS — M2550 Pain in unspecified joint: Secondary | ICD-10-CM | POA: Diagnosis not present

## 2023-11-14 DIAGNOSIS — G8929 Other chronic pain: Secondary | ICD-10-CM

## 2023-11-14 NOTE — Progress Notes (Unsigned)
 Established Patient Office Visit  Subjective   Patient ID: Regina Mendez, female    DOB: 1960/12/09  Age: 63 y.o. MRN: [id number]  Chief Complaint  Patient presents with   Generalized Body Aches    X1.5 months   Results    Patient requests to discuss results from [facility] provider    HPI  Discussed the use of AI scribe software for clinical note transcription with the patient, who gave verbal consent to proceed.  History of Present Illness   Regina Mendez is a 63 year old female with elevated rheumatoid factor who presents with severe joint pain.  She experiences severe, wrenching joint pain affecting various parts of her body, including her feet and heels. The pain is persistent and impacts her ability to concentrate and work. Ibuprofen has provided some relief recently.  Her rheumatoid factor is elevated at 49, with normal being up to 17. Tests for inflammation, including C-reactive protein and sedimentation rate, are negative. She recalls being tested for lupus multiple times as a teenager.  She has dry eyes and dry mouth, though not formally diagnosed as a specific syndrome. She also has chronic fatigue syndrome and irritable bowel syndrome, which she believes may be related to her current symptoms. She describes her body as hypersensitive and notes that stress may exacerbate her symptoms.  Her husband has concerns about possible sleep apnea, noting loud breathing during sleep, though he has not observed apneic episodes directly. 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  She is concerned about the impact of stress on her health, noting that current events and personal responsibilities contribute to her stress levels. She is planning a trip to [place], which she feels anxious about due to her health issues.      Current Outpatient Medications  Medication Instructions   Biotin  POWD Take by mouth.   clindamycin  (CLEOCIN -T) 1 % external solution Topical, 2 times daily   clindamycin -benzoyl peroxide (BENZACLIN) gel Topical, 2 times daily   Cyanocobalamin  (VITAMIN B 12) 500 MCG TABS 1 tablet Orally Once a day for 30 day(s)   EVENING PRIMROSE OIL PO Take by mouth.   Flaxseed, Linseed, (FLAX SEED OIL PO) Take by mouth.   ibuprofen (ADVIL) 200 MG tablet 1 tablet with food or milk as needed Orally Three times a day   Multiple Vitamins-Minerals (HAIR/SKIN/NAILS PO) Take by mouth.   Omega-3 Fatty Acids (FISH OIL) 1000 MG CAPS 1 capsule Orally Once a day   OVER THE COUNTER MEDICATION Krim Oil Ginger pill Iodine in kelp Marshmallow root   Probiotic Product (PROBIOTIC-10 PO) Take by mouth.   tretinoin  (RETIN-A ) 0.05 % cream Topical, Daily at bedtime   vitamin C 100 mg, Daily   VITAMIN D  PO Take by mouth.    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     Objective:     BP 124/80   Pulse 84   Temp 98.7 F (37.1 C) (Oral)   Ht 5' 5.5 (1.664 m)   Wt 120 lb 14.4 oz (54.8 kg)   LMP 08/13/2012 Comment: gyn  SpO2 98%   BMI 19.81 kg/m    Physical Exam Vitals reviewed.  Constitutional:      Appearance: Normal appearance. She is normal weight.  Pulmonary:      Effort: Pulmonary effort is normal.  Neurological:     General: No focal deficit present.     Mental Status: She is alert and oriented to person, place, and time.  Psychiatric:        Mood and Affect: Mood normal.        Behavior: Behavior normal.      No results found for any visits on 11/14/23.    The 10-year ASCVD risk score (Arnett DK, et al., 2019) is: 4.1%    Assessment & Plan:  Chronic pain of multiple joints -     Ambulatory referral to Physical Medicine Rehab  Other fatigue -     Ambulatory referral to Physical Medicine Rehab   Assessment and Plan    Chronic pain syndrome and fibromyalgia Chronic pain syndrome and fibromyalgia with widespread pain, including feet and heels, affecting concentration and work. Pain is severe and wrenching at times. No active inflammation as indicated by negative CRP and sedimentation rate tests. Fibromyalgia characterized by hypersensitivity and nerve firing without physical injury. - Refer to Physical Medicine and Rehabilitation (PMR) for pain management options such as massage, acupuncture, dry needling, TENS units, and myofascial injections.  Elevated rheumatoid factor Mildly elevated rheumatoid factor at 49, above normal range but not significantly high. No active inflammation as indicated by negative CRP and sedimentation rate tests. Elevated factor does not definitively indicate rheumatoid arthritis. Symptoms of dry eyes and dry mouth suggestive of possible Sjogren's syndrome, but not diagnosed. Stress may contribute to symptoms. I personally reviewed the notes and labs from her rheumatologist and provided counseling.  - Consider second opinion if needed.  Irritable bowel syndrome Irritable bowel syndrome with symptoms of bowel changes. Stress may exacerbate symptoms. Previous referral to GI specialist was made but not yet scheduled. - Follow up with referral coordinator to ensure GI specialist appointment is  scheduled.  Prediabetes Prediabetes with mildly elevated A1c. History of hypoglycemia. Dietary modifications in progress to manage blood sugar levels. - Recheck A1c at next annual visit.     I personally spent a total of 30 minutes in the care of the patient today including getting/reviewing separately obtained history, performing a medically appropriate exam/evaluation, counseling and educating, documenting clinical information in the EHR, independently interpreting results, and communicating results.   No follow-ups on file.    Heron CHRISTELLA Sharper, MD

## 2023-11-23 ENCOUNTER — Ambulatory Visit: Admitting: Psychology

## 2024-02-15 ENCOUNTER — Ambulatory Visit: Admitting: Pulmonary Disease

## 2024-02-15 ENCOUNTER — Encounter: Payer: Self-pay | Admitting: Pulmonary Disease

## 2024-02-15 VITALS — BP 115/79 | HR 80 | Temp 97.8°F | Ht 64.0 in | Wt 124.0 lb

## 2024-02-15 DIAGNOSIS — F419 Anxiety disorder, unspecified: Secondary | ICD-10-CM | POA: Diagnosis not present

## 2024-02-15 DIAGNOSIS — R0683 Snoring: Secondary | ICD-10-CM

## 2024-02-15 DIAGNOSIS — M797 Fibromyalgia: Secondary | ICD-10-CM | POA: Diagnosis not present

## 2024-02-15 DIAGNOSIS — R5383 Other fatigue: Secondary | ICD-10-CM

## 2024-02-15 DIAGNOSIS — G478 Other sleep disorders: Secondary | ICD-10-CM

## 2024-02-15 NOTE — Patient Instructions (Signed)
 We will schedule you for an in-lab sleep study  Elavil -amitriptyline -this can help with pain and discomfort, mild sedative which can help musculoskeletal pain related nonrestorative sleep - Give me a call or message once we have looked up the medication if you want us  to try it  Follow-up in about 6 to 8 weeks

## 2024-02-15 NOTE — Progress Notes (Signed)
 "              Regina Mendez    [id number]    [birth year]  Primary Care Physician:Michael, Heron HERO, MD  Referring Physician: Ozell Heron HERO, MD [address] [place],  [place] [address]  Chief complaint:   Patient being seen for snoring, nonrestorative sleep  Discussed the use of AI scribe software for clinical note transcription with the patient, who gave verbal consent to proceed.  History of Present Illness Regina Mendez is a 64 year old female with Sjogren's syndrome who presents with concerns about sleep disturbances and possible sleep apnea.  She experiences significant fatigue and lack of energy throughout the day, which she attributes to poor sleep quality. Her sleep pattern is inconsistent, with difficulty falling asleep despite feeling tired. She sometimes takes naps during the day. She uses a light Mexican medicine placed on her tongue to aid sleep, though the name and dosage are unspecified. Her husband has observed unusual sounds during her sleep, raising concerns about potential sleep apnea. She feels tired and semi-tired upon waking and does not feel rested. Despite adjusting her bedtime, she struggles with waking up early for exercise classes.  She experiences dry mouth and dry eyes, associated with her known diagnosis of Sjogren's syndrome. The dry mouth is severe enough to cause difficulty swallowing food, such as peanut butter, and her eyes require frequent use of eye drops, including Xiidra and additional drops as recommended by her eye doctor.  She has been experiencing musculoskeletal pain for the past three to four months, described as joint and muscle pain. She is taking magnesium supplements and has consulted various doctors to determine the cause. There is a mention of possible fibromyalgia. The pain sometimes wakes her up at night.  In her family history, both parents snored, with her mother being a particularly loud snorer.  No morning headaches or  abnormal night sweats, though she occasionally experiences night sweats after eating late. She does not frequently wake up to use the bathroom at night.  Will usually try to go to bed at 10 or 11 sometimes up to 12, takes about 30 minutes to fall asleep Wakes about once or twice Final wake up time will be about 9 or 10 in the morning Weight has been stable Admits to dryness of the mouth in the morning, no morning headaches Both parents snored She has possible fibromyalgia, Sjogren's  Does not smoke, does not use alcohol  Outpatient Encounter Medications as of 02/15/2024  Medication Sig   Ascorbic Acid (VITAMIN C) 100 MG tablet Take 100 mg by mouth daily.   Biotin POWD Take by mouth.   Cholecalciferol (VITAMIN D -3) 25 MCG (1000 UT) CAPS 1 capsule.   clindamycin  (CLEOCIN -T) 1 % external solution Apply topically 2 (two) times daily.   clindamycin -benzoyl peroxide (BENZACLIN) gel Apply topically 2 (two) times daily.   Cyanocobalamin  (VITAMIN B 12) 500 MCG TABS 1 tablet Orally Once a day for 30 day(s)   EVENING PRIMROSE OIL PO Take by mouth.   Flaxseed, Linseed, (FLAX SEED OIL PO) Take by mouth.   ibuprofen (ADVIL) 200 MG tablet 1 tablet with food or milk as needed Orally Three times a day   Multiple Vitamins-Minerals (HAIR/SKIN/NAILS PO) Take by mouth.   Omega-3 Fatty Acids (FISH OIL) 1000 MG CAPS 1 capsule Orally Once a day   OVER THE COUNTER MEDICATION Krim Oil Ginger pill Iodine in kelp Marshmallow root   Probiotic Product (PROBIOTIC-10 PO) Take by mouth.  tretinoin  (RETIN-A ) 0.05 % cream Apply topically at bedtime.   VITAMIN D  PO Take by mouth.   XIIDRA 5 % SOLN    No facility-administered encounter medications on file as of 02/15/2024.    Allergies as of 02/15/2024 - Review Complete 02/15/2024  Allergen Reaction Noted   Codeine Other (See Comments) 09/20/2006   Sulfonamide derivatives Dermatitis 09/20/2006    Past Medical History:  Diagnosis Date   Acne    At high risk  for breast cancer    33% lifetime risk, negative genetic testing [facility].   Breast cyst    COVID 05/2020   Dry mouth and eyes    Migraine headache     Past Surgical History:  Procedure Laterality Date   APPENDECTOMY     BREAST EXCISIONAL BIOPSY Right    atypical cells   BREAST SURGERY     right breast bx   BREAST SURGERY     Right lumpectomy -dysplastic cells-2018   COLPOSCOPY     GYNECOLOGIC CRYOSURGERY  age 87   OVARIAN CYST SURGERY     TONSILLECTOMY      Family History  Problem Relation Age of Onset   Thyroid  cancer Mother    Hypertension Father    Heart disease Father    Breast cancer Cousin 34 - 47   Breast cancer Cousin 56 - 64    Social History   Socioeconomic History   Marital status: Married    Spouse name: Not on file   Number of children: Not on file   Years of education: Not on file   Highest education level: Not on file  Occupational History   Not on file  Tobacco Use   Smoking status: Never   Smokeless tobacco: Never  Vaping Use   Vaping status: Never Used  Substance and Sexual Activity   Alcohol use: Yes    Alcohol/week: 1.0 standard drink of alcohol    Types: 1 Standard drinks or equivalent per week   Drug use: No   Sexual activity: Not Currently    Birth control/protection: Post-menopausal    Comment: 1st intercourse 63 yo-More than 5 partners  Other Topics Concern   Not on file  Social History Narrative   Not on file   Social Drivers of Health   Tobacco Use: Low Risk (02/15/2024)   Patient History    Smoking Tobacco Use: Never    Smokeless Tobacco Use: Never    Passive Exposure: Not on file  Financial Resource Strain: Not on file  Food Insecurity: No Food Insecurity (07/15/2021)   Received [facility]   Epic    Within the past 12 months, you worried that your food would run out before you got the money to buy more.: Never true    Within the past 12 months, the food you bought just didn't last and you didn't have money to  get more.: Never true  Transportation Needs: Not on file  Physical Activity: Not on file  Stress: Not on file  Social Connections: Not on file  Intimate Partner Violence: Not on file  Depression (PHQ2-9): Medium Risk (10/20/2023)   Depression (PHQ2-9)    PHQ-2 Score: 5  Alcohol Screen: Not on file  Housing: Not on file  Utilities: Not on file  Health Literacy: Not on file    Review of Systems  Constitutional:  Positive for fatigue.  Musculoskeletal:  Positive for arthralgias and myalgias.  Psychiatric/Behavioral:  Positive for sleep disturbance.     Vitals:  02/15/24 1310  BP: 115/79  Pulse: 80  Temp: 97.8 F (36.6 C)  SpO2: 98%     Physical Exam Constitutional:      Appearance: She is obese.  HENT:     Head: Normocephalic.     Mouth/Throat:     Mouth: Mucous membranes are moist.  Eyes:     General: No scleral icterus.    Pupils: Pupils are equal, round, and reactive to light.  Cardiovascular:     Rate and Rhythm: Normal rate and regular rhythm.     Heart sounds: No murmur heard.    No friction rub.  Pulmonary:     Effort: No respiratory distress.     Breath sounds: No stridor. No wheezing or rhonchi.  Musculoskeletal:     Cervical back: No rigidity or tenderness.  Neurological:     Mental Status: She is alert.  Psychiatric:        Mood and Affect: Mood normal.   Epworth Sleepiness Scale 3  Data Reviewed: No previous sleep study on record  Assessment and Plan Assessment & Plan Suspected obstructive sleep apnea Reports fatigue, lack of energy, and non-restorative sleep. Husband noted snoring. Symptoms suggest possible obstructive sleep apnea. Discussed home sleep study limitations, including potential false negatives due to frequent awakenings. In-lab sleep study recommended for accurate diagnosis. Explained apnea as absence of air movement and potential treatment options, including CPAP, oral devices, and surgery. Emphasized importance of diagnosis  before treatment initiation. - Ordered in-lab sleep study to evaluate for obstructive sleep apnea.  Fibromyalgia Reports musculoskeletal pain and fatigue, possibly related to fibromyalgia. Pain affects sleep quality. Discussed potential use of amitriptyline  (Elavil ) for pain management and its mild sedative effects. Advised to discuss with primary care physician before starting medication. - Discuss potential use of amitriptyline  with primary care physician.  History of anxiety - Follows with behavioral therapist  Nonrestorative sleep, multiple awakenings, home sleep test may be falsely negative in the context of a history of fibromyalgia with frequent arousals, light, nonrestorative sleep with possible alpha delta intrusion.  She has a high symptom burden but low AHI risk - Would be most appropriate for an in-lab study  With fibromyalgia if frequent movement, pain and discomfort is contributing to nonrestorative sleep medication like Elavil  may help, I did encourage her to look up more information Elavil  and this may be tried   Orders Placed This Encounter  Procedures   Polysomnography 4 or more parameters (NPSG)    Standing Status:   Future    Expiration Date:   02/14/2025    Where should this test be performed::   [facility]    I personally spent a total of 45 minutes in the care of the patient today including preparing to see the patient, getting/reviewing separately obtained history, performing a medically appropriate exam/evaluation, counseling and educating, placing orders, and documenting clinical information in the EHR.  Jennet Epley MD [facility] 02/15/2024, 9:36 PM  CC: Ozell Heron HERO, MD   "

## 2024-02-20 ENCOUNTER — Telehealth: Payer: Self-pay

## 2024-02-20 NOTE — Telephone Encounter (Signed)
 Copied from CRM [id number]. Topic: Appointments - Scheduling Inquiry for Clinic >> Feb 19, 2024 12:43 PM Regina Mendez wrote: Reason for CRM: pt calling about an appt that was just scheduled stated she has not spoken with anyone and she does not want to schedule anything until she speaks with someone and they explain to her what is being scheduled. Thanks   Called and spoke to pt. Pt states she was not notified of sleep study appointment and asked for more information. Advised pt that it is scheduled for 04/17/24 and [facility] is on the first floor of [facility] at [address], [place], [place], [address]. Please arrive by 7:30 pm. If you need assistance, call [phone] to speak with a member of our [facility] staff.   I offered to send the rest of the appointment information to the patient via MyChart msg, however the pt declined stating she rarely used MyChart and would write down the information instead. Provided pt with the number to reach the [facility] for further questions.  Pt verbalized understanding, NFN.

## 2024-02-21 ENCOUNTER — Encounter: Payer: Self-pay | Admitting: Pulmonary Disease

## 2024-04-17 ENCOUNTER — Ambulatory Visit (HOSPITAL_BASED_OUTPATIENT_CLINIC_OR_DEPARTMENT_OTHER): Admitting: Pulmonary Disease

## 2024-05-07 ENCOUNTER — Ambulatory Visit: Admitting: Obstetrics and Gynecology

## 2024-10-25 ENCOUNTER — Encounter: Admitting: Family Medicine
